# Patient Record
Sex: Female | Born: 1950 | Race: White | Hispanic: No | Marital: Single | Smoking: Never smoker
Health system: Southern US, Community
[De-identification: ages and names within clinical notes are randomized; demographics above are authoritative.]

## PROBLEM LIST (undated history)

## (undated) DIAGNOSIS — E781 Pure hyperglyceridemia: Secondary | ICD-10-CM

## (undated) DIAGNOSIS — S0285XA Fracture of orbit, unspecified, initial encounter for closed fracture: Secondary | ICD-10-CM

## (undated) DIAGNOSIS — S6291XA Unspecified fracture of right wrist and hand, initial encounter for closed fracture: Secondary | ICD-10-CM

## (undated) DIAGNOSIS — M1990 Unspecified osteoarthritis, unspecified site: Secondary | ICD-10-CM

## (undated) DIAGNOSIS — F32A Depression, unspecified: Secondary | ICD-10-CM

## (undated) DIAGNOSIS — F329 Major depressive disorder, single episode, unspecified: Secondary | ICD-10-CM

## (undated) DIAGNOSIS — F330 Major depressive disorder, recurrent, mild: Secondary | ICD-10-CM

## (undated) DIAGNOSIS — J45909 Unspecified asthma, uncomplicated: Secondary | ICD-10-CM

## (undated) HISTORY — DX: Pure hyperglyceridemia: E78.1

## (undated) HISTORY — DX: Depression, unspecified: F32.A

## (undated) HISTORY — DX: Major depressive disorder, recurrent, mild: F33.0

## (undated) HISTORY — DX: Major depressive disorder, single episode, unspecified: F32.9

## (undated) HISTORY — DX: Unspecified asthma, uncomplicated: J45.909

## (undated) HISTORY — DX: Unspecified fracture of right wrist and hand, initial encounter for closed fracture: S62.91XA

## (undated) HISTORY — PX: SIGMOIDOSCOPY: SUR1295

## (undated) HISTORY — DX: Fracture of orbit, unspecified, initial encounter for closed fracture: S02.85XA

---

## 1996-05-26 HISTORY — PX: BREAST BIOPSY: SHX20

## 2013-05-27 LAB — HM PAP SMEAR: HM PAP: NORMAL

## 2013-08-24 LAB — HM MAMMOGRAPHY: HM Mammogram: NORMAL

## 2014-05-28 ENCOUNTER — Ambulatory Visit: Payer: Self-pay | Admitting: Physician Assistant

## 2014-11-06 ENCOUNTER — Other Ambulatory Visit: Payer: Self-pay | Admitting: Internal Medicine

## 2014-11-06 DIAGNOSIS — Z1231 Encounter for screening mammogram for malignant neoplasm of breast: Secondary | ICD-10-CM

## 2014-11-08 ENCOUNTER — Ambulatory Visit
Admission: RE | Admit: 2014-11-08 | Discharge: 2014-11-08 | Disposition: A | Discharge status: Home / Self Care | Payer: 59 | Source: Ambulatory Visit | Attending: Internal Medicine | Admitting: Internal Medicine

## 2014-11-08 DIAGNOSIS — Z1231 Encounter for screening mammogram for malignant neoplasm of breast: Secondary | ICD-10-CM | POA: Insufficient documentation

## 2015-01-14 ENCOUNTER — Other Ambulatory Visit: Payer: Self-pay | Admitting: Internal Medicine

## 2015-01-14 ENCOUNTER — Encounter: Payer: Self-pay | Admitting: Internal Medicine

## 2015-01-14 DIAGNOSIS — F330 Major depressive disorder, recurrent, mild: Secondary | ICD-10-CM

## 2015-01-14 DIAGNOSIS — E781 Pure hyperglyceridemia: Secondary | ICD-10-CM | POA: Insufficient documentation

## 2015-01-14 HISTORY — DX: Major depressive disorder, recurrent, mild: F33.0

## 2015-02-02 ENCOUNTER — Encounter: Payer: Self-pay | Admitting: Internal Medicine

## 2015-02-02 ENCOUNTER — Ambulatory Visit (INDEPENDENT_AMBULATORY_CARE_PROVIDER_SITE_OTHER): Payer: 59 | Admitting: Internal Medicine

## 2015-02-02 VITALS — BP 110/70 | HR 72 | Ht 64.0 in | Wt 145.0 lb

## 2015-02-02 DIAGNOSIS — E781 Pure hyperglyceridemia: Secondary | ICD-10-CM | POA: Diagnosis not present

## 2015-02-02 DIAGNOSIS — Z1211 Encounter for screening for malignant neoplasm of colon: Secondary | ICD-10-CM | POA: Diagnosis not present

## 2015-02-02 DIAGNOSIS — Z0000 Encounter for general adult medical examination without abnormal findings: Secondary | ICD-10-CM

## 2015-02-02 DIAGNOSIS — F330 Major depressive disorder, recurrent, mild: Secondary | ICD-10-CM | POA: Diagnosis not present

## 2015-02-02 DIAGNOSIS — Z79899 Other long term (current) drug therapy: Secondary | ICD-10-CM | POA: Diagnosis not present

## 2015-02-02 MED ORDER — VENLAFAXINE HCL ER 150 MG PO CP24: 150.0000 mg | ORAL_CAPSULE | Freq: Every day | ORAL | Status: DC

## 2015-02-02 NOTE — Patient Instructions (Signed)

## 2015-02-02 NOTE — Progress Notes (Signed)
Date:  02/02/2015   Name:  Samantha Barton   DOB:  08-22-1950   MRN:  [id number]   Chief Complaint: Annual Exam and Depression Samantha Barton is a 64 y.o. female who presents today for her Complete Annual Exam. She feels fairly well. She reports exercising daily. She reports she is sleeping fairly well. Mammogram was done earlier this year. Pap smear was done within the past 2 years. She denies any breast complaints. She denies any vaginal discharge or abnormal bleeding. She's never had a colonoscopy but also denies any bowel problems. She is mostly limited by the fact that she has no one to take her to and from the colonoscopy. Depression        This is a chronic problem.  The current episode started more than 1 year ago.   The onset quality is gradual.   The problem occurs intermittently.  The problem has been waxing and waning since onset.  Associated symptoms include no decreased concentration, no fatigue and no headaches.     The symptoms are aggravated by nothing.  Past treatments include SNRIs - Serotonin and norepinephrine reuptake inhibitors.  Compliance with treatment is good.  she continues to struggle with depression. She has never had any hospitalizations or suicide attempts. She feels like her main issue has to do with social isolation. She volunteers and does other activities but never seems to connect with friends.   Review of Systems:  Review of Systems  Constitutional: Positive for unexpected weight change. Negative for fever, chills and fatigue.  HENT: Positive for hearing loss. Negative for sinus pressure, sore throat and trouble swallowing.   Eyes: Negative for visual disturbance.  Respiratory: Negative for choking, shortness of breath and wheezing.   Cardiovascular: Negative for chest pain, palpitations and leg swelling.  Gastrointestinal: Negative for abdominal pain, diarrhea, constipation and blood in stool.  Genitourinary: Negative for dysuria, urgency, frequency,  difficulty urinating and pelvic pain.  Musculoskeletal: Negative for arthralgias, gait problem and neck pain.  Allergic/Immunologic: Negative for environmental allergies and food allergies.  Neurological: Negative for tremors, syncope and headaches.  Psychiatric/Behavioral: Positive for depression and dysphoric mood. Negative for confusion, sleep disturbance, decreased concentration and agitation. The patient is not nervous/anxious and is not hyperactive.     Patient Active Problem List   Diagnosis Date Noted  . Hypertriglyceridemia 01/14/2015  . 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 Authorizing Provider  tretinoin (RETIN-A) 0.05 % cream RETIN-A, 0.05% (External Cream) - Historical Medication  (0.05 %) Active   Yes Historical Provider, MD  venlafaxine XR (EFFEXOR XR) 150 MG 24 hr capsule Take 1 capsule by mouth daily. 09/11/14  Yes Historical Provider, MD    No Known Allergies  Past Surgical History  Procedure Laterality Date  . Breast biopsy Right 1998    bx/clip-neg    Social History  Substance Use Topics  . Smoking status: Never Smoker   . Smokeless tobacco: None  . Alcohol Use: No     Medication list has been reviewed and updated.  Physical Examination:  Physical Exam  Constitutional: She is oriented to person, place, and time. She appears well-developed and well-nourished. No distress.  HENT:  Head: Normocephalic and atraumatic.  Eyes: Conjunctivae and EOM are normal. Pupils are equal, round, and reactive to light. Right eye exhibits no discharge. Left eye exhibits no discharge. No scleral icterus.  Neck: Normal range of motion. Neck  supple. No thyromegaly present.  Cardiovascular: Normal rate, regular rhythm and normal heart sounds.   Pulmonary/Chest: Effort normal and breath sounds normal. No respiratory distress. Right breast exhibits no mass, no nipple discharge, no skin  change and no tenderness. Left breast exhibits no mass, no nipple discharge, no skin change and no tenderness.  Abdominal: Soft. There is no tenderness. There is no rebound and no guarding.  Musculoskeletal: Normal range of motion. She exhibits no edema or tenderness.  Lymphadenopathy:    She has no cervical adenopathy.  Neurological: She is alert and oriented to person, place, and time. She has normal reflexes.  Skin: Skin is warm and dry. No rash noted.  Psychiatric: She has a normal mood and affect. Her behavior is normal. Thought content normal.  Nursing note and vitals reviewed.   BP 110/70 mmHg  Pulse 72  Ht 5\' 4"  (1.626 m)  Wt 145 lb (65.772 kg)  BMI 24.88 kg/m2  Assessment and Plan: 1. Annual physical exam Continue regular exercise, continue breast self exams, continue annual mammograms Consider Pap smear next year - POCT urinalysis dipstick  2. Depression, major, recurrent, mild Doing fairly well on current medication Patient is aware of resources should she need more assistance - venlafaxine XR (EFFEXOR XR) 150 MG 24 hr capsule; Take 1 capsule (150 mg total) by mouth daily.  Dispense: 90 capsule; Refill: 3 - TSH  3. Hypertriglyceridemia Continue low-fat diet - Lipid panel  4. Long term use of drug - CBC with Differential/Platelet - Comprehensive metabolic panel  5. Colon cancer screening We'll send fecal occult testing Unable to do colonoscopy due to lack of transportation - Fecal occult blood, imunochemical   Halina Maidens, MD [facility]  02/02/2015

## 2015-02-06 LAB — COMPREHENSIVE METABOLIC PANEL
A/G RATIO: 1.5 (ref 1.1–2.5)
ALK PHOS: 66 IU/L (ref 39–117)
ALT: 17 IU/L (ref 0–32)
AST: 21 IU/L (ref 0–40)
Albumin: 4.3 g/dL (ref 3.6–4.8)
BUN/Creatinine Ratio: 21 (ref 11–26)
BUN: 18 mg/dL (ref 8–27)
Bilirubin Total: 0.4 mg/dL (ref 0.0–1.2)
CALCIUM: 9.9 mg/dL (ref 8.7–10.3)
CO2: 28 mmol/L (ref 18–29)
Chloride: 100 mmol/L (ref 97–108)
Creatinine, Ser: 0.84 mg/dL (ref 0.57–1.00)
GFR calc Af Amer: 86 mL/min/{1.73_m2} (ref >59)
GFR calc non Af Amer: 74 mL/min/{1.73_m2} (ref >59)
Globulin, Total: 2.8 g/dL (ref 1.5–4.5)
Glucose: 93 mg/dL (ref 65–99)
POTASSIUM: 4.4 mmol/L (ref 3.5–5.2)
Sodium: 141 mmol/L (ref 134–144)
Total Protein: 7.1 g/dL (ref 6.0–8.5)

## 2015-02-06 LAB — CBC WITH DIFFERENTIAL/PLATELET
Basophils Absolute: 0 10*3/uL (ref 0.0–0.2)
Basos: 0 %
EOS (ABSOLUTE): 0.1 10*3/uL (ref 0.0–0.4)
Eos: 2 %
Hematocrit: 40.1 % (ref 34.0–46.6)
Hemoglobin: 13.6 g/dL (ref 11.1–15.9)
IMMATURE GRANULOCYTES: 0 %
Immature Grans (Abs): 0 10*3/uL (ref 0.0–0.1)
Lymphocytes Absolute: 1.7 10*3/uL (ref 0.7–3.1)
Lymphs: 36 %
MCH: 33.2 pg — ABNORMAL HIGH (ref 26.6–33.0)
MCHC: 33.9 g/dL (ref 31.5–35.7)
MCV: 98 fL — ABNORMAL HIGH (ref 79–97)
MONOS ABS: 0.4 10*3/uL (ref 0.1–0.9)
Monocytes: 8 %
NEUTROS PCT: 54 %
Neutrophils Absolute: 2.6 10*3/uL (ref 1.4–7.0)
PLATELETS: 295 10*3/uL (ref 150–379)
RBC: 4.1 x10E6/uL (ref 3.77–5.28)
RDW: 13.5 % (ref 12.3–15.4)
WBC: 4.8 10*3/uL (ref 3.4–10.8)

## 2015-02-06 LAB — LIPID PANEL
CHOL/HDL RATIO: 4.1 ratio (ref 0.0–4.4)
CHOLESTEROL TOTAL: 268 mg/dL — AB (ref 100–199)
HDL: 65 mg/dL (ref >39)
LDL Calculated: 172 mg/dL — ABNORMAL HIGH (ref 0–99)
TRIGLYCERIDES: 156 mg/dL — AB (ref 0–149)
VLDL Cholesterol Cal: 31 mg/dL (ref 5–40)

## 2015-02-06 LAB — TSH: TSH: 1.15 u[IU]/mL (ref 0.450–4.500)

## 2015-02-23 LAB — PLEASE NOTE

## 2015-02-23 LAB — FECAL OCCULT BLOOD, IMMUNOCHEMICAL: FECAL OCCULT BLD: NEGATIVE

## 2015-04-07 ENCOUNTER — Ambulatory Visit
Admission: AD | Admit: 2015-04-07 | Discharge: 2015-04-07 | Disposition: A | Discharge status: Home / Self Care | Payer: 59 | Source: Ambulatory Visit | Attending: Emergency Medicine | Admitting: Emergency Medicine

## 2015-04-07 ENCOUNTER — Ambulatory Visit: Payer: 59

## 2015-04-07 ENCOUNTER — Emergency Department
Admission: EM | Admit: 2015-04-07 | Discharge: 2015-04-07 | Disposition: A | Discharge status: Home / Self Care | Payer: 59 | Attending: Emergency Medicine | Admitting: Emergency Medicine

## 2015-04-07 ENCOUNTER — Ambulatory Visit: Admission: RE | Admit: 2015-04-07 | Payer: 59 | Source: Home / Self Care | Admitting: Emergency Medicine

## 2015-04-07 DIAGNOSIS — S6291XA Unspecified fracture of right wrist and hand, initial encounter for closed fracture: Secondary | ICD-10-CM

## 2015-04-07 DIAGNOSIS — Z79899 Other long term (current) drug therapy: Secondary | ICD-10-CM | POA: Insufficient documentation

## 2015-04-07 DIAGNOSIS — S62306A Unspecified fracture of fifth metacarpal bone, right hand, initial encounter for closed fracture: Secondary | ICD-10-CM | POA: Diagnosis not present

## 2015-04-07 DIAGNOSIS — S6991XA Unspecified injury of right wrist, hand and finger(s), initial encounter: Secondary | ICD-10-CM | POA: Diagnosis present

## 2015-04-07 DIAGNOSIS — S62304A Unspecified fracture of fourth metacarpal bone, right hand, initial encounter for closed fracture: Secondary | ICD-10-CM | POA: Diagnosis not present

## 2015-04-07 DIAGNOSIS — Y9289 Other specified places as the place of occurrence of the external cause: Secondary | ICD-10-CM | POA: Insufficient documentation

## 2015-04-07 DIAGNOSIS — M7989 Other specified soft tissue disorders: Secondary | ICD-10-CM | POA: Diagnosis present

## 2015-04-07 DIAGNOSIS — Y998 Other external cause status: Secondary | ICD-10-CM | POA: Diagnosis not present

## 2015-04-07 DIAGNOSIS — Y9352 Activity, horseback riding: Secondary | ICD-10-CM | POA: Insufficient documentation

## 2015-04-07 MED ORDER — TRAMADOL HCL 50 MG PO TABS: 50.0000 mg | ORAL_TABLET | Freq: Four times a day (QID) | ORAL | Status: DC | PRN

## 2015-04-07 MED ORDER — IBUPROFEN 800 MG PO TABS: 800.0000 mg | ORAL_TABLET | Freq: Three times a day (TID) | ORAL | Status: DC | PRN

## 2015-04-07 NOTE — ED Provider Notes (Signed)
[facility] Emergency Department Provider Note  ____________________________________________  Time seen: Approximately 1:02 PM  I have reviewed the triage vital signs and the nursing notes.   HISTORY  Chief Complaint Hand Pain    HPI Samantha Barton is a 64 y.o. female patient complaining of right hand pain and edema secondary to a fall from a horse. Instead occurred yesterday. Patient is rating her pain discomfort as a 6/10. No palliative measures taken this complaint. Patient is right-hand dominant. Denies any loss sensation or loss of strength.   Past Medical History  Diagnosis Date  . Depression     Patient Active Problem List   Diagnosis Date Noted  . Hypertriglyceridemia 01/14/2015  . Depression, major, recurrent, mild ([place]) 01/14/2015    Past Surgical History  Procedure Laterality Date  . Breast biopsy Right 1998    bx/clip-neg    Current Outpatient Rx  Name  Route  Sig  Dispense  Refill  . ibuprofen (ADVIL,MOTRIN) 800 MG tablet   Oral   Take 1 tablet (800 mg total) by mouth every 8 (eight) hours as needed for moderate pain.   15 tablet   0   . traMADol (ULTRAM) 50 MG tablet   Oral   Take 1 tablet (50 mg total) by mouth every 6 (six) hours as needed for moderate pain.   12 tablet   0   . tretinoin (RETIN-A) 0.05 % cream      RETIN-A, 0.05% (External Cream) - Historical Medication  (0.05 %) Active         . venlafaxine XR (EFFEXOR XR) 150 MG 24 hr capsule   Oral   Take 1 capsule (150 mg total) by mouth daily.   90 capsule   3     Allergies Review of patient's allergies indicates no known allergies.  Family History  Problem Relation Age of Onset  . Diabetes Father     Social History Social History  Substance Use Topics  . Smoking status: Never Smoker   . Smokeless tobacco: None  . Alcohol Use: No    Review of Systems Constitutional: No fever/chills Eyes: No visual changes. ENT: No sore  throat. Cardiovascular: Denies chest pain. Respiratory: Denies shortness of breath. Gastrointestinal: No abdominal pain.  No nausea, no vomiting.  No diarrhea.  No constipation. Genitourinary: Negative for dysuria. Musculoskeletal: Negative for back pain. Skin: Negative for rash. Neurological: Negative for headaches, focal weakness or numbness.  10-point ROS otherwise negative.  ____________________________________________   PHYSICAL EXAM:  VITAL SIGNS: ED Triage Vitals  Enc Vitals Group     BP 04/07/15 1254 126/76 mmHg     Pulse Rate 04/07/15 1254 75     Resp 04/07/15 1254 17     Temp 04/07/15 1254 97.7 F (36.5 C)     Temp Source 04/07/15 1254 Oral     SpO2 04/07/15 1254 98 %     Weight 04/07/15 1254 140 lb (63.504 kg)     Height 04/07/15 1254 5\' 4"  (1.626 m)     Head Cir --      Peak Flow --      Pain Score 04/07/15 1255 6     Pain Loc --      Pain Edu? --      Excl. in [place]? --     Constitutional: Alert and oriented. Well appearing and in no acute distress. Eyes: Conjunctivae are normal. PERRL. EOMI. Head: Atraumatic. Nose: No congestion/rhinnorhea. Mouth/Throat: Mucous membranes are moist.  Oropharynx non-erythematous.  Neck: No stridor.  No cervical spine tenderness to palpation. Hematological/Lymphatic/Immunilogical: No cervical lymphadenopathy. Cardiovascular: Normal rate, regular rhythm. Grossly normal heart sounds.  Good peripheral circulation. Respiratory: Normal respiratory effort.  No retractions. Lungs CTAB. Gastrointestinal: Soft and nontender. No distention. No abdominal bruits. No CVA tenderness. Musculoskeletal: Obvious edema to the right hand.  Neurologic:  Normal speech and language. No gross focal neurologic deficits are appreciated. No gait instability. Skin:  Skin is warm, dry and intact. No rash noted. Psychiatric: Mood and affect are normal. Speech and behavior are normal.  ____________________________________________   LABS (all labs  ordered are listed, but only abnormal results are displayed)  Labs Reviewed - No data to display ____________________________________________  EKG   ____________________________________________  RADIOLOGY  Fractured right fifth and fourth metacarpal PROCEDURES  Procedure(s) performed: None  Critical Care performed: No  ____________________________________________   INITIAL IMPRESSION / ASSESSMENT AND PLAN / ED COURSE  Pertinent labs & imaging results that were available during my care of the patient were reviewed by me and considered in my medical decision making (see chart for details).  Fracture right fourth and fifth metacarpal. Patient placed an ulnar gutter splint. Patient advised to follow orthopedics by calling for pulmonary Monday morning. She given prescription for ibuprofen and tramadol. ____________________________________________   FINAL CLINICAL IMPRESSION(S) / ED DIAGNOSES  Final diagnoses:  Right hand fracture, closed, initial encounter      Sable Feil, PA-C 04/07/15 [place], MD 04/07/15 [phone]

## 2015-04-07 NOTE — ED Notes (Signed)
Pt states she was thrown from a horse yesterday and is having swelling to the right hand and face.Marland Kitchen

## 2015-04-07 NOTE — ED Notes (Signed)
Pt here to have x-rays order read, PA unable to access the read, results printed by this RN with pt's permission and per request of Randel Pigg PA, witnessed by [employer] RN

## 2015-04-07 NOTE — Discharge Instructions (Signed)
Wear his splint until follow-up with orthopedic Dr.

## 2015-04-10 ENCOUNTER — Other Ambulatory Visit: Payer: Self-pay | Admitting: Ophthalmology

## 2015-04-10 ENCOUNTER — Ambulatory Visit
Admission: RE | Admit: 2015-04-10 | Discharge: 2015-04-10 | Disposition: A | Discharge status: Home / Self Care | Payer: 59 | Source: Ambulatory Visit | Attending: Ophthalmology | Admitting: Ophthalmology

## 2015-04-10 DIAGNOSIS — S0083XA Contusion of other part of head, initial encounter: Secondary | ICD-10-CM | POA: Diagnosis not present

## 2015-04-10 DIAGNOSIS — H0589 Other disorders of orbit: Secondary | ICD-10-CM | POA: Insufficient documentation

## 2015-04-10 DIAGNOSIS — T148XXA Other injury of unspecified body region, initial encounter: Secondary | ICD-10-CM

## 2015-04-10 DIAGNOSIS — S0230XA Fracture of orbital floor, unspecified side, initial encounter for closed fracture: Secondary | ICD-10-CM | POA: Diagnosis not present

## 2015-04-23 ENCOUNTER — Other Ambulatory Visit: Payer: 59

## 2015-04-24 ENCOUNTER — Encounter: Payer: Self-pay | Admitting: *Deleted

## 2015-04-24 ENCOUNTER — Other Ambulatory Visit: Payer: 59

## 2015-04-24 NOTE — Patient Instructions (Signed)
  Your procedure is scheduled on: 04-25-15 Report to [facility] To find out your arrival time please call [phone] between 1PM - 3PM on 04-24-15  Remember: Instructions that are not followed completely may result in serious medical risk, up to and including death, or upon the discretion of your surgeon and anesthesiologist your surgery may need to be rescheduled.    _X___ 1. Do not eat food or drink liquids after midnight. No gum chewing or hard candies.     _X___ 2. No Alcohol for 24 hours before or after surgery.   ____ 3. Bring all medications with you on the day of surgery if instructed.    ____ 4. Notify your doctor if there is any change in your medical condition     (cold, fever, infections).     Do not wear jewelry, make-up, hairpins, clips or nail polish.  Do not wear lotions, powders, or perfumes. You may wear deodorant.  Do not shave 48 hours prior to surgery. Men may shave face and neck.  Do not bring valuables to the hospital.    [facility] is not responsible for any belongings or valuables.               Contacts, dentures or bridgework may not be worn into surgery.  Leave your suitcase in the car. After surgery it may be brought to your room.  For patients admitted to the hospital, discharge time is determined by your  treatment team.   Patients discharged the day of surgery will not be allowed to drive home.   Please read over the following fact sheets that you were given:     _X___ Take these medicines the morning of surgery with A SIP OF WATER:    1. EFFEXOR  2.   3.   4.  5.  6.  ____ Fleet Enema (as directed)   ____ Use CHG Soap as directed  ____ Use inhalers on the day of surgery  ____ Stop metformin 2 days prior to surgery    ____ Take 1/2 of usual insulin dose the night before surgery and none on the morning of surgery.   ____ Stop Coumadin/Plavix/aspirin-N/A  ____ Stop Anti-inflammatories-NO NSAIDS OR ASA  PRODUCTS-TYLENOL OK TO TAKE   ____ Stop supplements until after surgery.    ____ Bring C-Pap to the hospital.

## 2015-04-25 ENCOUNTER — Ambulatory Visit: Payer: 59 | Admitting: *Deleted

## 2015-04-25 ENCOUNTER — Ambulatory Visit
Admission: RE | Admit: 2015-04-25 | Discharge: 2015-04-25 | Disposition: A | Discharge status: Home / Self Care | Payer: 59 | Source: Ambulatory Visit | Attending: Otolaryngology | Admitting: Otolaryngology

## 2015-04-25 ENCOUNTER — Encounter: Payer: Self-pay | Admitting: *Deleted

## 2015-04-25 ENCOUNTER — Encounter
Admission: RE | Disposition: A | Discharge status: Home / Self Care | Payer: Self-pay | Source: Ambulatory Visit | Attending: Otolaryngology

## 2015-04-25 DIAGNOSIS — F329 Major depressive disorder, single episode, unspecified: Secondary | ICD-10-CM | POA: Diagnosis not present

## 2015-04-25 DIAGNOSIS — Z79899 Other long term (current) drug therapy: Secondary | ICD-10-CM | POA: Insufficient documentation

## 2015-04-25 DIAGNOSIS — W19XXXA Unspecified fall, initial encounter: Secondary | ICD-10-CM | POA: Diagnosis not present

## 2015-04-25 DIAGNOSIS — S0231XA Fracture of orbital floor, right side, initial encounter for closed fracture: Secondary | ICD-10-CM | POA: Insufficient documentation

## 2015-04-25 DIAGNOSIS — S0230XA Fracture of orbital floor, unspecified side, initial encounter for closed fracture: Secondary | ICD-10-CM

## 2015-04-25 DIAGNOSIS — M1990 Unspecified osteoarthritis, unspecified site: Secondary | ICD-10-CM | POA: Diagnosis not present

## 2015-04-25 HISTORY — PX: ORIF ORBITAL FRACTURE: SHX5312

## 2015-04-25 HISTORY — DX: Unspecified osteoarthritis, unspecified site: M19.90

## 2015-04-25 HISTORY — PX: ORBITAL FRACTURE SURGERY: SHX725

## 2015-04-25 SURGERY — OPEN REDUCTION INTERNAL FIXATION (ORIF) ORBITAL FRACTURE
Anesthesia: General | Site: Face | Laterality: Right | Wound class: Clean

## 2015-04-25 MED ORDER — BACITRACIN ZINC 500 UNIT/GM EX OINT
TOPICAL_OINTMENT | CUTANEOUS | Status: AC
  Filled 2015-04-25: qty 28.35

## 2015-04-25 MED ORDER — FENTANYL CITRATE (PF) 100 MCG/2ML IJ SOLN
INTRAMUSCULAR | Status: DC | PRN
  Administered 2015-04-25 (×3): 25 ug via INTRAVENOUS
  Administered 2015-04-25: 100 ug via INTRAVENOUS
  Administered 2015-04-25: 50 ug via INTRAVENOUS

## 2015-04-25 MED ORDER — PROPOFOL 10 MG/ML IV BOLUS
INTRAVENOUS | Status: DC | PRN
  Administered 2015-04-25: 160 mg via INTRAVENOUS

## 2015-04-25 MED ORDER — HYDROMORPHONE HCL 1 MG/ML IJ SOLN
INTRAMUSCULAR | Status: AC
  Filled 2015-04-25: qty 1

## 2015-04-25 MED ORDER — LACTATED RINGERS IV SOLN
INTRAVENOUS | Status: DC
  Administered 2015-04-25 (×3): via INTRAVENOUS

## 2015-04-25 MED ORDER — PHENYLEPHRINE HCL 10 MG/ML IJ SOLN: INTRAMUSCULAR | Status: DC | PRN

## 2015-04-25 MED ORDER — NEOMYCIN-POLYMYXIN-DEXAMETH 3.5-10000-0.1 OP OINT
TOPICAL_OINTMENT | OPHTHALMIC | Status: DC | PRN
  Administered 2015-04-25: 1 via OPHTHALMIC

## 2015-04-25 MED ORDER — HYDROMORPHONE HCL 1 MG/ML IJ SOLN
0.2500 mg | INTRAMUSCULAR | Status: DC | PRN
  Administered 2015-04-25 (×2): 0.25 mg via INTRAVENOUS

## 2015-04-25 MED ORDER — DEXAMETHASONE SODIUM PHOSPHATE 4 MG/ML IJ SOLN
INTRAMUSCULAR | Status: DC | PRN
  Administered 2015-04-25: 8 mg via INTRAVENOUS

## 2015-04-25 MED ORDER — LIDOCAINE-EPINEPHRINE (PF) 1 %-1:200000 IJ SOLN
INTRAMUSCULAR | Status: DC | PRN
Start: 2015-04-25 — End: 2015-04-25
  Administered 2015-04-25: 1.5 mL

## 2015-04-25 MED ORDER — LIDOCAINE-EPINEPHRINE (PF) 1 %-1:200000 IJ SOLN
INTRAMUSCULAR | Status: AC
  Filled 2015-04-25: qty 30

## 2015-04-25 MED ORDER — GELATIN ADSORBABLE OP FILM
ORAL_FILM | OPHTHALMIC | Status: AC
  Filled 2015-04-25: qty 1

## 2015-04-25 MED ORDER — MIDAZOLAM HCL 2 MG/2ML IJ SOLN
INTRAMUSCULAR | Status: DC | PRN
  Administered 2015-04-25: 2 mg via INTRAVENOUS

## 2015-04-25 MED ORDER — FAMOTIDINE 20 MG PO TABS
ORAL_TABLET | ORAL | Status: AC
  Administered 2015-04-25: 20 mg via ORAL
  Filled 2015-04-25: qty 1

## 2015-04-25 MED ORDER — PHENYLEPHRINE HCL 10 MG/ML IJ SOLN
INTRAMUSCULAR | Status: DC | PRN
  Administered 2015-04-25 (×2): 50 ug via INTRAVENOUS
  Administered 2015-04-25: 5 ug via INTRAVENOUS

## 2015-04-25 MED ORDER — ROCURONIUM BROMIDE 100 MG/10ML IV SOLN
INTRAVENOUS | Status: DC | PRN
  Administered 2015-04-25 (×2): 10 mg via INTRAVENOUS
  Administered 2015-04-25: 40 mg via INTRAVENOUS
  Administered 2015-04-25: 10 mg via INTRAVENOUS

## 2015-04-25 MED ORDER — HYDROCODONE-ACETAMINOPHEN 5-325 MG PO TABS: 1.0000 | ORAL_TABLET | ORAL | Status: DC | PRN

## 2015-04-25 MED ORDER — SUGAMMADEX SODIUM 200 MG/2ML IV SOLN
INTRAVENOUS | Status: DC | PRN
  Administered 2015-04-25: 120 mg via INTRAVENOUS

## 2015-04-25 MED ORDER — ONDANSETRON HCL 4 MG/2ML IJ SOLN
INTRAMUSCULAR | Status: DC | PRN
  Administered 2015-04-25: 4 mg via INTRAVENOUS

## 2015-04-25 MED ORDER — LIDOCAINE HCL (CARDIAC) 20 MG/ML IV SOLN
INTRAVENOUS | Status: DC | PRN
  Administered 2015-04-25: 40 mg via INTRAVENOUS

## 2015-04-25 MED ORDER — ONDANSETRON HCL 4 MG/2ML IJ SOLN: 4.0000 mg | Freq: Once | INTRAMUSCULAR | Status: DC | PRN

## 2015-04-25 MED ORDER — FAMOTIDINE 20 MG PO TABS
20.0000 mg | ORAL_TABLET | Freq: Once | ORAL | Status: AC
Start: 2015-04-25 — End: 2015-04-25
  Administered 2015-04-25: 20 mg via ORAL

## 2015-04-25 MED ORDER — CEFPROZIL 500 MG PO TABS: 500.0000 mg | ORAL_TABLET | Freq: Two times a day (BID) | ORAL | Status: DC

## 2015-04-25 SURGICAL SUPPLY — 33 items
ADHESIVE MASTISOL STRL (MISCELLANEOUS) ×2 IMPLANT
APPLICATOR COTTON TIP 6IN STRL (MISCELLANEOUS) ×2 IMPLANT
BLADE SURG 15 STRL LF DISP TIS (BLADE) ×1 IMPLANT
BLADE SURG 15 STRL SS (BLADE) ×1
CANISTER SUCT 1200ML W/VALVE (MISCELLANEOUS) ×2 IMPLANT
CORD BIP STRL DISP 12FT (MISCELLANEOUS) ×2 IMPLANT
DRAPE MAG INST 16X20 L/F (DRAPES) ×2 IMPLANT
ELECT CAUTERY NEEDLE 2.0 MIC (NEEDLE) ×2 IMPLANT
FORCEPS JEWEL BIP 4-3/4 STR (INSTRUMENTS) ×2 IMPLANT
GAUZE SPONGE 4X4 12PLY STRL (GAUZE/BANDAGES/DRESSINGS) ×2 IMPLANT
GLOVE BIO SURGEON STRL SZ7.5 (GLOVE) ×2 IMPLANT
GOWN STRL REUS W/ TWL LRG LVL3 (GOWN DISPOSABLE) ×2 IMPLANT
GOWN STRL REUS W/TWL LRG LVL3 (GOWN DISPOSABLE) ×2
KIT RM TURNOVER STRD PROC AR (KITS) ×2 IMPLANT
LABEL OR SOLS (LABEL) ×2 IMPLANT
NS IRRIG 1000ML POUR BTL (IV SOLUTION) ×2 IMPLANT
Orbital Plate ×2 IMPLANT
PACK HEAD/NECK (MISCELLANEOUS) ×2 IMPLANT
PAD GROUND ADULT SPLIT (MISCELLANEOUS) ×2 IMPLANT
SCREW UPPERFACE 1.2X3M SL (Screw) ×4 IMPLANT
SOL BAL SALT 15ML (MISCELLANEOUS) ×2
SOLUTION BAL SALT 15ML (MISCELLANEOUS) ×1 IMPLANT
STRIP CLOSURE SKIN 1/2X4 (GAUZE/BANDAGES/DRESSINGS) ×2 IMPLANT
SUCTION FRAZIER TIP 10 FR DISP (SUCTIONS) ×2 IMPLANT
SUT CHROMIC 5-0 18 C-1 SG197 (SUTURE)
SUT PLAIN GUT (SUTURE) IMPLANT
SUT SILK 3-0 (SUTURE) ×2
SUT SILK 3-0 RB1 30XBRD (SUTURE) ×2
SUT VIC AB 4-0 RB1 27 (SUTURE) ×1
SUT VIC AB 4-0 RB1 27X BRD (SUTURE) ×1 IMPLANT
SUT VIC AB 5-0 RB1 27 (SUTURE) IMPLANT
SUTURE CHROMC 5-0 18 C-1 SG197 (SUTURE) IMPLANT
SUTURE SILK 3-0 RB1 30XBRD (SUTURE) ×2 IMPLANT

## 2015-04-25 NOTE — Transfer of Care (Signed)
Immediate Anesthesia Transfer of Care Note  Patient: Samantha Barton  Procedure(s) Performed: Procedure(s): OPEN REDUCTION INTERNAL FIXATION (ORIF) ORBITAL FRACTURE (Right)  Patient Location: PACU  Anesthesia Type:General  Level of Consciousness: sedated  Airway & Oxygen Therapy: Patient Spontanous Breathing and Patient connected to nasal cannula oxygen  Post-op Assessment: Report given to RN  Post vital signs: Reviewed and stable  Last Vitals:  Filed Vitals:   04/25/15 0934 04/25/15 1331  BP: 143/80 149/88  Pulse: 100 89  Temp: 37 C 36.7 C  Resp: 16 16    Complications: No apparent anesthesia complications

## 2015-04-25 NOTE — Discharge Instructions (Signed)
AMBULATORY SURGERY  DISCHARGE INSTRUCTIONS   1) The drugs that you were given will stay in your system until tomorrow so for the next 24 hours you should not:  A) Drive an automobile B) Make any legal decisions C) Drink any alcoholic beverage      Orbital Floor Fracture, Non-Blowout The eye sits in the part of the skull called the "orbit." The upper and outside walls of the orbit are thick and strong. The inside wall (near the nose) and the orbital floor are very thin and weak. The tissues around the eye will briefly press together if there is a direct blow to the front of the eye. This leads to high pressure against the orbital walls. The inside wall and the orbital floor may break since these are the weakest walls. If the orbital floor breaks, the tissues around the eye, including the muscle that makes the eye look down, may become trapped in the sinus below when the orbital floor "blows out." If a blowout does not happen, the orbital floor fracture is considered a non-blowout orbital fracture. CAUSES An orbital floor fracture can be caused by any accident in which an object hits the face or the face strikes against a hard object. The most common ways that people break their eye socket include: Being hit by a blunt object, such as a baseball bat or a fist. Striking the face on the car dashboard during a crash. Falls. Gunshot. SYMPTOMS  If there has been no injury to the eye itself, symptoms may include: Puffiness (swelling) and bruising around the eye area (black eye). Numbness of the cheek and upper gum on the side with the floor fracture. This is caused by nerve injury to these areas. Pain around the eye. Headache. Ear pain on the injured side. DIAGNOSIS The diagnosis of an orbital floor fracture is suspected during an eye exam by an ophthalmologist. It is confirmed by X-rays or CT scan. TREATMENT Your caregiver may suggest waiting 1 or 2 weeks for the swelling to go away before  examining the eye. When the swelling lessens, your caregiver will examine the eye to see if there is any sign of a trapped muscle or double vision when looking in different directions. If double vision is not found and muscle or tissue did not get trapped, no further treatment is necessary. After that, in almost all cases, the bones heal together on their own.  HOME CARE INSTRUCTIONS Take all pain medicine as directed by your caregiver. Use ice packs or other cold therapy to reduce swelling as directed by your caregiver. Do not put a contact lens in the injured eye until your caregiver approves. Avoid dusty environments. Always wear protective glasses or goggles when recommended. Wearing protective eyewear is not dangerous to your injured eye and will not delay healing. As long as your other eye is seeing normally, you may return to work and drive. You may travel by plane or be in high altitudes. However, your swelling may take longer to go away, and you may have sinus pain. Be aware that your depth perception and your ability to judge distance may be reduced or lost. SEEK IMMEDIATE MEDICAL CARE IF: Your vision changes. Your redness or swelling persists around the injured eye or gets worse. You start to have double vision. You have a bloody or discolored discharge from your nose. You have a fever that lasts longer than 2 to 3 days. You have a fever that suddenly gets worse. Your cheek or  upper gum numbness does not go away. MAKE SURE YOU: Understand these instructions. Will watch your condition. Will get help right away if you are not doing well or get worse.   This information is not intended to replace advice given to you by your health care provider. Make sure you discuss any questions you have with your health care provider.   Document Released: 08/04/2011 Document Revised: 06/02/2014 Document Reviewed: 08/04/2011 Elsevier Interactive Patient Education 2016 [employer]    APPLY  OPTHALMIC OINTMENT TO RIGHT EYE 3 TIMES A DAILY   SALINE WETTING DROPS AS NEEDED   ICE PACK TO RIGHT EYE TO HELP WITH SWELLING AS NEEDED

## 2015-04-25 NOTE — Anesthesia Preprocedure Evaluation (Signed)
Anesthesia Evaluation  Patient identified by MRN, date of birth, ID band Patient awake    Reviewed: Allergy & Precautions, NPO status , Patient's Chart, lab work & pertinent test results  Airway Mallampati: I  TM Distance: >3 FB Neck ROM: Full    Dental  (+) Teeth Intact   Pulmonary    Pulmonary exam normal        Cardiovascular Exercise Tolerance: Good negative cardio ROS Normal cardiovascular exam     Neuro/Psych Depression Treated.   GI/Hepatic negative GI ROS, Neg liver ROS,   Endo/Other  negative endocrine ROS  Renal/GU negative Renal ROS     Musculoskeletal  (+) Arthritis , Osteoarthritis,    Abdominal Normal abdominal exam  (+)   Peds  Hematology negative hematology ROS (+)   Anesthesia Other Findings   Reproductive/Obstetrics                             Anesthesia Physical Anesthesia Plan  ASA: II  Anesthesia Plan: General   Post-op Pain Management:    Induction: Intravenous  Airway Management Planned: Oral ETT  Additional Equipment:   Intra-op Plan:   Post-operative Plan: Extubation in OR  Informed Consent: I have reviewed the patients History and Physical, chart, labs and discussed the procedure including the risks, benefits and alternatives for the proposed anesthesia with the patient or authorized representative who has indicated his/her understanding and acceptance.     Plan Discussed with: CRNA  Anesthesia Plan Comments:         Anesthesia Quick Evaluation

## 2015-04-25 NOTE — Anesthesia Procedure Notes (Signed)
Procedure Name: Intubation Date/Time: 04/25/2015 11:05 AM Performed by: Allean Found Pre-anesthesia Checklist: Patient identified, Emergency Drugs available, Suction available, Patient being monitored and Timeout performed Patient Re-evaluated:Patient Re-evaluated prior to inductionOxygen Delivery Method: Circle system utilized Preoxygenation: Pre-oxygenation with 100% oxygen Intubation Type: IV induction Ventilation: Mask ventilation without difficulty Laryngoscope Size: Mac and 3 Grade View: Grade I Tube type: Oral Tube size: 7.0 mm Number of attempts: 1 Airway Equipment and Method: Stylet Placement Confirmation: ETT inserted through vocal cords under direct vision,  positive ETCO2 and breath sounds checked- equal and bilateral Secured at: 21 cm Tube secured with: Tape Dental Injury: Teeth and Oropharynx as per pre-operative assessment

## 2015-04-25 NOTE — H&P (Signed)
History and physical reviewed and will be scanned in later. No change in medical status reported by the patient or family, appears stable for surgery. All questions regarding the procedure answered, and patient (or family if a child) expressed understanding of the procedure.  Samantha Barton @TODAY@ 

## 2015-04-25 NOTE — Op Note (Signed)
04/25/2015  1:15 PM    Samantha Barton  [id number]   Pre-Op Diagnosis:  RIGHT ORBITAL FLOOR FRACTURE  Post-op Diagnosis: RIGHT ORBITAL FLOOR FRACTURE  Procedure:   Open reduction and internal fixation of right orbital floor fracture with trans-conjunctival approach  Surgeon:  Riley Nearing  Anesthesia:  General endotracheal  EBL:  Less than 10 cc  Complications:  None  Findings: Orbital floor trap door deformity with fragmentation of the bone requiring titanium mesh reconstruction  Procedure: After the patient was identified in holding and the procedure was reviewed.  The patient was taken to the operating room and with the patient in a comfortable supine position,  general endotracheal anesthesia was induced without difficulty.  A proper time-out was performed, confirming the operative site and procedure.  1% lidocaine with epinephrine 1 200,000 was injected into the conjunctiva as well as the lateral canthus region, and injected through the lid at the level of the orbital rim for hemostasis. The patient was then prepped and draped in the usual sterile fashion. BSS solution was used to moisten the eye.  A 15 blade was used to incise the conjunctiva just below the tarsal plate, starting just lateral to the punctum and extending the incision out laterally towards the lateral canthus. Tenotomies were then used to dissect through the orbital septum just below the tarsal plate, until the orbicularis and oculi muscle was encountered. The cut edge of the conjunctiva was sutured with 3-0 silk suture, which was then used to provide countertraction, pulling the conjunctiva of the lid over the cornea and protecting it. This was secured to the drapes with hemostats, providing excellent protection of the cornea which was completely covered. BSS solution was frequently placed in the eye below this conjunctival flap 3 lubricate the cornea during the surgery. Dissection then proceeded inferiorly between  the muscle and the orbital septum until the orbital rim was reached. The periosteum of the orbital rim was incised sharply with a 15 blade. A Freer elevator was then used to elevate the periorbita from the floor of the orbit, immediately encountering the trap door defect in the floor of the orbit, and fat was herniating into the maxillary sinus. This tissue was reduced back into the orbit completely, carefully dissecting until the entirety of the herniating fat was reduced, and the orbital floor  defect was completely exposed. The infraorbital nerve was noted to be dehiscent medially and was carefully protected during the procedure, as it was at the medial aspect of the fracture line. A hook was then used to reduce the fracture, however the bone was comminuted and portions of it missing, and I could not get the bone to reliably reduce as it wanted to herniate back into the maxillary sinus. It was felt that a Gelfilm reconstruction would not be adequate for this reason, so a titanium mesh plate was used instead. This was carefully trimmed to fit the defect with adequate overlap of surrounding intact bone, minimizing the size of the plate is much as possible. The plate was also recontoured to fit as closely to the orbital contour as possible. It was then placed into position and secured with two 3 mm screws at the orbital rim, after using a drill with copious irrigation to place drill holes for the screws. The wound was then irrigated and the conjunctival incision closed with 6-0 chromic gut suture in a running stitch with the knots buried. Forced duction testing was performed and the eye moved freely with no  evidence of entrapment. The eye was irrigated once again with BSS solution and ophthalmic antibiotic ointment then applied to the eye.  The patient was then returned to the anesthesiologist in good condition for awakening. The patient was awakened and taken to the recovery room in good  condition.   Disposition:   PACU then discharge home  Plan: Cefadroxil 500 mg by mouth twice a day for 10 days, apply ophthalmic ointment to the eye 3 times daily, and saline rewetting drops as needed. Ice to the eye to help with swelling as needed. Follow-up in 1 week  Riley Nearing 04/25/2015 1:15 PM

## 2015-04-25 NOTE — Anesthesia Postprocedure Evaluation (Signed)
Anesthesia Post Note  Patient: Samantha Barton  Procedure(s) Performed: Procedure(s) (LRB): OPEN REDUCTION INTERNAL FIXATION (ORIF) ORBITAL FRACTURE (Right)  Patient location during evaluation: PACU Anesthesia Type: General Level of consciousness: awake and alert Pain management: pain level controlled Vital Signs Assessment: post-procedure vital signs reviewed and stable Respiratory status: spontaneous breathing Cardiovascular status: blood pressure returned to baseline Postop Assessment: no headache Anesthetic complications: no    Last Vitals:  Filed Vitals:   04/25/15 1430 04/25/15 1447  BP: 149/86 135/82  Pulse: 82 83  Temp:  35.6 C  Resp: 14 14    Last Pain:  Filed Vitals:   04/25/15 1449  PainSc: 5                  Edel Rivero, Marene M

## 2015-05-27 HISTORY — PX: FRACTURE SURGERY: SHX138

## 2015-06-08 DIAGNOSIS — S62319D Displaced fracture of base of unspecified metacarpal bone, subsequent encounter for fracture with routine healing: Secondary | ICD-10-CM | POA: Diagnosis not present

## 2015-06-08 DIAGNOSIS — H43811 Vitreous degeneration, right eye: Secondary | ICD-10-CM | POA: Diagnosis not present

## 2015-06-20 DIAGNOSIS — H524 Presbyopia: Secondary | ICD-10-CM | POA: Diagnosis not present

## 2015-06-27 DIAGNOSIS — S0285XA Fracture of orbit, unspecified, initial encounter for closed fracture: Secondary | ICD-10-CM

## 2015-06-27 DIAGNOSIS — S6291XA Unspecified fracture of right wrist and hand, initial encounter for closed fracture: Secondary | ICD-10-CM

## 2015-06-27 HISTORY — DX: Unspecified fracture of right wrist and hand, initial encounter for closed fracture: S62.91XA

## 2015-06-27 HISTORY — DX: Fracture of orbit, unspecified, initial encounter for closed fracture: S02.85XA

## 2015-10-16 ENCOUNTER — Other Ambulatory Visit: Payer: Self-pay | Admitting: Internal Medicine

## 2015-10-16 DIAGNOSIS — Z1231 Encounter for screening mammogram for malignant neoplasm of breast: Secondary | ICD-10-CM

## 2015-11-13 ENCOUNTER — Ambulatory Visit: Payer: 59

## 2015-12-04 ENCOUNTER — Ambulatory Visit
Admission: RE | Admit: 2015-12-04 | Discharge: 2015-12-04 | Disposition: A | Discharge status: Home / Self Care | Payer: 59 | Source: Ambulatory Visit | Attending: Internal Medicine | Admitting: Internal Medicine

## 2015-12-04 DIAGNOSIS — Z1231 Encounter for screening mammogram for malignant neoplasm of breast: Secondary | ICD-10-CM | POA: Diagnosis not present

## 2015-12-28 DIAGNOSIS — D224 Melanocytic nevi of scalp and neck: Secondary | ICD-10-CM | POA: Diagnosis not present

## 2015-12-28 DIAGNOSIS — L570 Actinic keratosis: Secondary | ICD-10-CM | POA: Diagnosis not present

## 2015-12-28 DIAGNOSIS — D485 Neoplasm of uncertain behavior of skin: Secondary | ICD-10-CM | POA: Diagnosis not present

## 2015-12-28 DIAGNOSIS — L821 Other seborrheic keratosis: Secondary | ICD-10-CM | POA: Diagnosis not present

## 2015-12-28 DIAGNOSIS — Z1283 Encounter for screening for malignant neoplasm of skin: Secondary | ICD-10-CM | POA: Diagnosis not present

## 2016-01-06 ENCOUNTER — Other Ambulatory Visit: Payer: Self-pay | Admitting: Internal Medicine

## 2016-01-07 DIAGNOSIS — D485 Neoplasm of uncertain behavior of skin: Secondary | ICD-10-CM | POA: Diagnosis not present

## 2016-01-07 DIAGNOSIS — D224 Melanocytic nevi of scalp and neck: Secondary | ICD-10-CM | POA: Diagnosis not present

## 2016-01-08 ENCOUNTER — Ambulatory Visit (INDEPENDENT_AMBULATORY_CARE_PROVIDER_SITE_OTHER): Payer: 59 | Admitting: Internal Medicine

## 2016-01-08 ENCOUNTER — Encounter: Payer: Self-pay | Admitting: Internal Medicine

## 2016-01-08 VITALS — BP 117/78 | HR 86 | Resp 16 | Ht 64.0 in | Wt 143.2 lb

## 2016-01-08 DIAGNOSIS — F330 Major depressive disorder, recurrent, mild: Secondary | ICD-10-CM

## 2016-01-08 MED ORDER — VENLAFAXINE HCL ER 75 MG PO CP24: 75.0000 mg | ORAL_CAPSULE | Freq: Every day | ORAL | 0 refills | Status: DC

## 2016-01-08 NOTE — Progress Notes (Signed)
    Date:  01/08/2016   Name:  Samantha Barton   DOB:  [birth year]   MRN:  [id number]   Chief Complaint: Depression (Wants Effexor increased ) Depression         This is a chronic problem.  The current episode started more than 1 year ago.   The problem has been gradually worsening since onset.  Associated symptoms include helplessness, hopelessness, irritable, restlessness and decreased interest.  Past treatments include SNRIs - Serotonin and norepinephrine reuptake inhibitors.     Review of Systems  Constitutional: Negative for diaphoresis and unexpected weight change.  Eyes: Negative for visual disturbance.  Respiratory: Negative for cough, chest tightness, shortness of breath and wheezing.   Musculoskeletal: Negative for arthralgias, gait problem and joint swelling.  Psychiatric/Behavioral: Positive for depression. Negative for dysphoric mood. The patient is not nervous/anxious and is not hyperactive.     Patient Active Problem List   Diagnosis Date Noted  . Hypertriglyceridemia 01/14/2015  . Depression, major, recurrent, mild ([place]) 01/14/2015    Prior to Admission medications   Medication Sig Start Date End Date Taking? Authorizing Provider  acetaminophen (TYLENOL) 325 MG tablet Take 650 mg by mouth every 6 (six) hours as needed.   Yes Historical Provider, MD  B Complex-C-Biotin-D-Zinc-FA (VITAL-D RX PO) Take by mouth.   Yes Historical Provider, MD  tretinoin (RETIN-A) 0.05 % cream RETIN-A, 0.05% (External Cream) - Historical Medication  (0.05 %) Active   Yes Historical Provider, MD  venlafaxine XR (EFFEXOR XR) 150 MG 24 hr capsule Take 1 capsule (150 mg total) by mouth daily. Patient taking differently: Take 150 mg by mouth every morning.  02/02/15  Yes Glean Hess, MD    No Known Allergies  Past Surgical History:  Procedure Laterality Date  . BREAST BIOPSY Right 1998   bx/clip-neg  . ORBITAL FRACTURE SURGERY  04/25/2015  . ORIF ORBITAL FRACTURE Right 04/25/2015    Procedure: OPEN REDUCTION INTERNAL FIXATION (ORIF) ORBITAL FRACTURE;  Surgeon: Clyde Canterbury, MD;  Location: [facility] ORS;  Service: ENT;  Laterality: Right;  . SIGMOIDOSCOPY      Social History  Substance Use Topics  . Smoking status: Never Smoker  . Smokeless tobacco: Never Used  . Alcohol use No     Medication list has been reviewed and updated.   Physical Exam  Constitutional: She is oriented to person, place, and time. She appears well-developed. She is irritable. No distress.  HENT:  Head: Normocephalic and atraumatic.  Pulmonary/Chest: Effort normal. No respiratory distress.  Musculoskeletal: Normal range of motion.  Neurological: She is alert and oriented to person, place, and time.  Skin: Skin is warm and dry. No rash noted.  Psychiatric: Her behavior is normal. Judgment and thought content normal. Her mood appears anxious. Her speech is rapid and/or pressured. Cognition and memory are normal.    BP 117/78 (BP Location: Right Arm, Patient Position: Sitting, Cuff Size: Normal)   Pulse 86   Resp 16   Ht 5\' 4"  (1.626 m)   Wt 143 lb 3.2 oz (65 kg)   SpO2 96%   BMI 24.58 kg/m   Assessment and Plan: 1. Depression, major, recurrent, mild ([place]) Need to adjust medication Begin counseling as recommended by employer - venlafaxine XR (EFFEXOR XR) 75 MG 24 hr capsule; Take 1 capsule (75 mg total) by mouth daily with breakfast.  Dispense: 90 capsule; Refill: 0   Halina Maidens, MD [facility]  01/08/2016

## 2016-02-05 ENCOUNTER — Ambulatory Visit: Payer: 59 | Admitting: Internal Medicine

## 2016-02-05 ENCOUNTER — Ambulatory Visit (INDEPENDENT_AMBULATORY_CARE_PROVIDER_SITE_OTHER): Payer: 59 | Admitting: Internal Medicine

## 2016-02-05 ENCOUNTER — Encounter: Payer: Self-pay | Admitting: Internal Medicine

## 2016-02-05 DIAGNOSIS — F330 Major depressive disorder, recurrent, mild: Secondary | ICD-10-CM | POA: Diagnosis not present

## 2016-02-05 MED ORDER — VENLAFAXINE HCL ER 75 MG PO CP24: 75.0000 mg | ORAL_CAPSULE | Freq: Every day | ORAL | 0 refills | Status: DC

## 2016-02-05 MED ORDER — VENLAFAXINE HCL ER 150 MG PO CP24: 150.0000 mg | ORAL_CAPSULE | Freq: Every day | ORAL | 3 refills | Status: DC

## 2016-02-05 NOTE — Progress Notes (Signed)
Date:  02/05/2016   Name:  Samantha Barton   DOB:  Apr 30, 1951   MRN:  [id number]   Chief Complaint: Depression Depression         This is a chronic problem.  The problem has been gradually improving since onset.  Associated symptoms include headaches (mild).  Associated symptoms include no suicidal ideas.  Past treatments include SNRIs - Serotonin and norepinephrine reuptake inhibitors.  Compliance with treatment is good.  Previous treatment provided moderate relief. Now only working 2 days a week and does not have to interact directly with her supervisor.  She has joined a Geophysicist/field seismologist and a "planning for retirement" focus group.  She is more outgoing now and feels that the higher dose of effexor has helped.  She will also continue to see the therapist as needed.  Review of Systems  Constitutional: Negative for diaphoresis and unexpected weight change.  Eyes: Negative for visual disturbance.  Respiratory: Negative for cough, chest tightness, shortness of breath and wheezing.   Musculoskeletal: Negative for arthralgias, gait problem and joint swelling.  Neurological: Positive for headaches (mild).  Psychiatric/Behavioral: Positive for depression. Negative for dysphoric mood, sleep disturbance and suicidal ideas. The patient is not nervous/anxious and is not hyperactive.     Patient Active Problem List   Diagnosis Date Noted  . Hypertriglyceridemia 01/14/2015  . Depression, major, recurrent, mild ([place]) 01/14/2015    Prior to Admission medications   Medication Sig Start Date End Date Taking? Authorizing Provider  acetaminophen (TYLENOL) 325 MG tablet Take 650 mg by mouth every 6 (six) hours as needed.   Yes Historical Provider, MD  B Complex-C-Biotin-D-Zinc-FA (VITAL-D RX PO) Take by mouth.   Yes Historical Provider, MD  tretinoin (RETIN-A) 0.05 % cream RETIN-A, 0.05% (External Cream) - Historical Medication  (0.05 %) Active   Yes Historical Provider, MD  venlafaxine XR (EFFEXOR XR)  150 MG 24 hr capsule Take 1 capsule (150 mg total) by mouth daily. Patient taking differently: Take 150 mg by mouth every morning.  02/02/15  Yes Samantha Hess, MD  venlafaxine XR (EFFEXOR XR) 75 MG 24 hr capsule Take 1 capsule (75 mg total) by mouth daily with breakfast. 01/08/16  Yes Samantha Hess, MD    No Known Allergies  Past Surgical History:  Procedure Laterality Date  . BREAST BIOPSY Right 1998   bx/clip-neg  . ORBITAL FRACTURE SURGERY  04/25/2015  . ORIF ORBITAL FRACTURE Right 04/25/2015   Procedure: OPEN REDUCTION INTERNAL FIXATION (ORIF) ORBITAL FRACTURE;  Surgeon: Clyde Canterbury, MD;  Location: [facility] ORS;  Service: ENT;  Laterality: Right;  . SIGMOIDOSCOPY      Social History  Substance Use Topics  . Smoking status: Never Smoker  . Smokeless tobacco: Never Used  . Alcohol use No     Medication list has been reviewed and updated.   Physical Exam  Constitutional: She is oriented to person, place, and time. She appears well-developed. No distress.  HENT:  Head: Normocephalic and atraumatic.  Neck: Normal range of motion. Neck supple. Carotid bruit is not present. No thyromegaly present.  Cardiovascular: Normal rate, regular rhythm and normal heart sounds.   Pulmonary/Chest: Effort normal and breath sounds normal. No respiratory distress. She has no wheezes.  Musculoskeletal: Normal range of motion.  Lymphadenopathy:    She has no cervical adenopathy.  Neurological: She is alert and oriented to person, place, and time.  Skin: Skin is warm and dry. No rash noted.  Psychiatric: She has a  normal mood and affect. Her speech is normal and behavior is normal. Thought content normal.  Nursing note and vitals reviewed.   BP 102/64 (BP Location: Right Arm, Patient Position: Sitting, Cuff Size: Normal)   Pulse 74   Resp 16   Ht 5\' 4"  (1.626 m)   Wt 143 lb (64.9 kg)   SpO2 96%   BMI 24.55 kg/m   Assessment and Plan: 1. Depression, major, recurrent, mild  ([facility]) Continue current dosing - venlafaxine XR (EFFEXOR XR) 150 MG 24 hr capsule; Take 1 capsule (150 mg total) by mouth daily.  Dispense: 90 capsule; Refill: 3 - venlafaxine XR (EFFEXOR XR) 75 MG 24 hr capsule; Take 1 capsule (75 mg total) by mouth daily with breakfast.  Dispense: 90 capsule; Refill: 0   Halina Maidens, MD [facility]  02/05/2016

## 2016-02-05 NOTE — Patient Instructions (Signed)
Check on coverage for Cologuard - CPT code [id number].

## 2016-02-06 ENCOUNTER — Ambulatory Visit: Payer: 59 | Admitting: Internal Medicine

## 2016-03-12 ENCOUNTER — Encounter: Payer: Self-pay | Admitting: Internal Medicine

## 2016-03-12 ENCOUNTER — Ambulatory Visit (INDEPENDENT_AMBULATORY_CARE_PROVIDER_SITE_OTHER): Payer: 59 | Admitting: Internal Medicine

## 2016-03-12 VITALS — BP 120/82 | HR 84 | Resp 16 | Ht 64.0 in | Wt 143.0 lb

## 2016-03-12 DIAGNOSIS — F330 Major depressive disorder, recurrent, mild: Secondary | ICD-10-CM

## 2016-03-12 DIAGNOSIS — E781 Pure hyperglyceridemia: Secondary | ICD-10-CM

## 2016-03-12 DIAGNOSIS — Z1211 Encounter for screening for malignant neoplasm of colon: Secondary | ICD-10-CM | POA: Diagnosis not present

## 2016-03-12 DIAGNOSIS — Z23 Encounter for immunization: Secondary | ICD-10-CM

## 2016-03-12 DIAGNOSIS — Z1159 Encounter for screening for other viral diseases: Secondary | ICD-10-CM

## 2016-03-12 DIAGNOSIS — E2839 Other primary ovarian failure: Secondary | ICD-10-CM | POA: Diagnosis not present

## 2016-03-12 DIAGNOSIS — Z0000 Encounter for general adult medical examination without abnormal findings: Secondary | ICD-10-CM | POA: Diagnosis not present

## 2016-03-12 LAB — POCT URINALYSIS DIPSTICK
BILIRUBIN UA: NEGATIVE
Glucose, UA: NEGATIVE
KETONES UA: NEGATIVE
Leukocytes, UA: NEGATIVE
Nitrite, UA: NEGATIVE
PH UA: 7
Protein, UA: NEGATIVE
RBC UA: NEGATIVE
Spec Grav, UA: 1.015
Urobilinogen, UA: 0.2

## 2016-03-12 NOTE — Progress Notes (Signed)
Date:  03/12/2016   Name:  Samantha Barton   DOB:  [birth year]   MRN:  [id number]   Chief Complaint: Annual Exam Samantha Barton is a 65 y.o. female who presents today for her Complete Annual Exam. She feels fairly well. She reports exercising none. She reports she is sleeping well. Mammogram done this summer was normal.  No breast problems noted. She is doing well on Effexor but is not exercising.  She is working weekends only now.  She feels lazy but knows that exercise would be beneficial.   Review of Systems  Constitutional: Negative for chills, fatigue and fever.  HENT: Negative for congestion, hearing loss, tinnitus, trouble swallowing and voice change.   Eyes: Negative for visual disturbance.  Respiratory: Positive for shortness of breath (with exertion). Negative for cough, chest tightness and wheezing.   Cardiovascular: Negative for chest pain, palpitations and leg swelling.  Gastrointestinal: Negative for abdominal pain, constipation, diarrhea and vomiting.  Endocrine: Negative for polydipsia and polyuria.  Genitourinary: Negative for dysuria, frequency, genital sores, vaginal bleeding and vaginal discharge.  Musculoskeletal: Negative for arthralgias, gait problem and joint swelling.  Skin: Negative for color change and rash.  Neurological: Negative for dizziness, tremors, light-headedness and headaches.  Hematological: Negative.  Negative for adenopathy. Does not bruise/bleed easily.  Psychiatric/Behavioral: Negative for agitation, dysphoric mood and sleep disturbance. The patient is not nervous/anxious.     Patient Active Problem List   Diagnosis Date Noted  . Hypertriglyceridemia 01/14/2015  . 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 Authorizing Provider  acetaminophen (TYLENOL) 325 MG tablet Take 650 mg by mouth every 6 (six) hours as needed.   Yes Historical Provider, MD    B Complex-C-Biotin-D-Zinc-FA (VITAL-D RX PO) Take by mouth.   Yes Historical Provider, MD  tretinoin (RETIN-A) 0.05 % cream RETIN-A, 0.05% (External Cream) - Historical Medication  (0.05 %) Active   Yes Historical Provider, MD  venlafaxine XR (EFFEXOR XR) 150 MG 24 hr capsule Take 1 capsule (150 mg total) by mouth daily. 02/05/16  Yes Glean Hess, MD  venlafaxine XR (EFFEXOR XR) 75 MG 24 hr capsule Take 1 capsule (75 mg total) by mouth daily with breakfast. 02/05/16  Yes Glean Hess, MD    No Known Allergies  Past Surgical History:  Procedure Laterality Date  . BREAST BIOPSY Right 1998   bx/clip-neg  . ORBITAL FRACTURE SURGERY  04/25/2015  . ORIF ORBITAL FRACTURE Right 04/25/2015   Procedure: OPEN REDUCTION INTERNAL FIXATION (ORIF) ORBITAL FRACTURE;  Surgeon: Clyde Canterbury, MD;  Location: [facility] ORS;  Service: ENT;  Laterality: Right;  . SIGMOIDOSCOPY      Social History  Substance Use Topics  . Smoking status: Never Smoker  . Smokeless tobacco: Never Used  . Alcohol use No     Medication list has been reviewed and updated.   Physical Exam  Constitutional: She is oriented to person, place, and time. She appears well-developed and well-nourished. No distress.  HENT:  Head: Normocephalic and atraumatic.  Right Ear: Tympanic membrane and ear canal normal.  Left Ear: Tympanic membrane and ear canal normal.  Nose: Right sinus exhibits no maxillary sinus tenderness. Left sinus exhibits no maxillary sinus tenderness.  Mouth/Throat: Uvula is midline and oropharynx is clear and moist.  Eyes: Conjunctivae and EOM are normal. Right eye exhibits no discharge. Left eye exhibits no discharge. No scleral icterus.  Neck: Normal  range of motion. Carotid bruit is not present. No erythema present. No thyromegaly present.  Cardiovascular: Normal rate, regular rhythm, normal heart sounds and normal pulses.   Pulmonary/Chest: Effort normal. No respiratory distress. She has no wheezes.  Right breast exhibits no mass, no nipple discharge, no skin change and no tenderness. Left breast exhibits no mass, no nipple discharge, no skin change and no tenderness.  Abdominal: Soft. Bowel sounds are normal. There is no hepatosplenomegaly. There is no tenderness. There is no CVA tenderness.  Musculoskeletal: Normal range of motion.  Lymphadenopathy:    She has no cervical adenopathy.    She has no axillary adenopathy.  Neurological: She is alert and oriented to person, place, and time. She has normal reflexes. No cranial nerve deficit or sensory deficit.  Skin: Skin is warm, dry and intact. No rash noted.  Psychiatric: She has a normal mood and affect. Her speech is normal and behavior is normal. Thought content normal.  Nursing note and vitals reviewed.   BP 120/82   Pulse 84   Resp 16   Ht 5\' 4"  (1.626 m)   Wt 143 lb (64.9 kg)   SpO2 100%   BMI 24.55 kg/m   Assessment and Plan: 1. Annual physical exam Recommend regular aerobic exercise - CBC with Differential/Platelet - POCT urinalysis dipstick  2. Colon cancer screening - Cologuard  3. Need for hepatitis C screening test - Hepatitis C antibody  4. Depression, major, recurrent, mild ([place]) Controlled - continue current therapy - TSH  5. Hypertriglyceridemia - Comprehensive metabolic panel - Lipid panel  6. Need for pneumococcal vaccination - Pneumococcal conjugate vaccine 13-valent IM  7. Ovarian failure Borderline bone density in the past Continue calcium and vitamin D - DG Bone Density; Future   Samantha Maidens, MD [facility]  03/12/2016

## 2016-03-12 NOTE — Patient Instructions (Addendum)
Pneumococcal Conjugate Vaccine (PCV13)   1. Why get vaccinated?  Vaccination can protect both children and adults from pneumococcal disease.  Pneumococcal disease is caused by bacteria that can spread from person to person through close contact. It can cause ear infections, and it can also lead to more serious infections of the:  · Lungs (pneumonia),  · Blood (bacteremia), and  · Covering of the brain and spinal cord (meningitis).  Pneumococcal pneumonia is most common among adults. Pneumococcal meningitis can cause deafness and brain damage, and it kills about 1 child in 10 who get it.  Anyone can get pneumococcal disease, but children under 2 years of age and adults 65 years and older, people with certain medical conditions, and cigarette smokers are at the highest risk.  Before there was a vaccine, the [place] saw:  · more than 700 cases of meningitis,  · about 13,000 blood infections,  · about 5 million ear infections, and  · about 200 deaths  in children under 5 each year from pneumococcal disease. Since vaccine became available, severe pneumococcal disease in these children has fallen by 88%.  About 18,000 older adults die of pneumococcal disease each year in the [place].  Treatment of pneumococcal infections with penicillin and other drugs is not as effective as it used to be, because some strains of the disease have become resistant to these drugs. This makes prevention of the disease, through vaccination, even more important.  2. PCV13 vaccine  Pneumococcal conjugate vaccine (called PCV13) protects against 13 types of pneumococcal bacteria.  PCV13 is routinely given to children at 2, 4, 6, and 12-15 months of age. It is also recommended for children and adults 2 to 64 years of age with certain health conditions, and for all adults 65 years of age and older. Your doctor can give you details.  3. Some people should not get this vaccine  Anyone who has ever had a life-threatening allergic reaction  to a dose of this vaccine, to an earlier pneumococcal vaccine called PCV7, or to any vaccine containing diphtheria toxoid (for example, DTaP), should not get PCV13.  Anyone with a severe allergy to any component of PCV13 should not get the vaccine. Tell your doctor if the person being vaccinated has any severe allergies.  If the person scheduled for vaccination is not feeling well, your healthcare provider might decide to reschedule the shot on another day.  4. Risks of a vaccine reaction  With any medicine, including vaccines, there is a chance of reactions. These are usually mild and go away on their own, but serious reactions are also possible.  Problems reported following PCV13 varied by age and dose in the series. The most common problems reported among children were:  · About half became drowsy after the shot, had a temporary loss of appetite, or had redness or tenderness where the shot was given.  · About 1 out of 3 had swelling where the shot was given.  · About 1 out of 3 had a mild fever, and about 1 in 20 had a fever over 102.2°F.  · Up to about 8 out of 10 became fussy or irritable.  Adults have reported pain, redness, and swelling where the shot was given; also mild fever, fatigue, headache, chills, or muscle pain.  Young children who get PCV13 along with inactivated flu vaccine at the same time may be at increased risk for seizures caused by fever. Ask your doctor for more information.  Problems that   could happen after any vaccine:  · People sometimes faint after a medical procedure, including vaccination. Sitting or lying down for about 15 minutes can help prevent fainting, and injuries caused by a fall. Tell your doctor if you feel dizzy, or have vision changes or ringing in the ears.  · Some older children and adults get severe pain in the shoulder and have difficulty moving the arm where a shot was given. This happens very rarely.  · Any medication can cause a severe allergic reaction. Such  reactions from a vaccine are very rare, estimated at about 1 in a million doses, and would happen within a few minutes to a few hours after the vaccination.  As with any medicine, there is a very small chance of a vaccine causing a serious injury or death.  The safety of vaccines is always being monitored. For more information, visit: www.cdc.gov/vaccinesafety/  5. What if there is a serious reaction?  What should I look for?  · Look for anything that concerns you, such as signs of a severe allergic reaction, very high fever, or unusual behavior.  Signs of a severe allergic reaction can include hives, swelling of the face and throat, difficulty breathing, a fast heartbeat, dizziness, and weakness-usually within a few minutes to a few hours after the vaccination.  What should I do?  · If you think it is a severe allergic reaction or other emergency that can't wait, call 9-1-1 or get the person to the nearest hospital. Otherwise, call your doctor.  Reactions should be reported to the Vaccine Adverse Event Reporting System (VAERS). Your doctor should file this report, or you can do it yourself through the VAERS web site at www.vaers.hhs.gov, or by calling 1-800-822-7967.  VAERS does not give medical advice.  6. The National Vaccine Injury Compensation Program  The National Vaccine Injury Compensation Program (VICP) is a federal program that was created to compensate people who may have been injured by certain vaccines.  Persons who believe they may have been injured by a vaccine can learn about the program and about filing a claim by calling 1-800-338-2382 or visiting the VICP website at www.hrsa.gov/vaccinecompensation. There is a time limit to file a claim for compensation.  7. How can I learn more?  · Ask your healthcare provider. He or she can give you the vaccine package insert or suggest other sources of information.  · Call your local or state health department.  · Contact the Centers for Disease Control and  Prevention (CDC):    Call 1-800-232-4636 (1-800-CDC-INFO) or    Visit CDC's website at www.cdc.gov/vaccines  Vaccine Information Statement  PCV13 Vaccine (03/30/2014)     This information is not intended to replace advice given to you by your health care provider. Make sure you discuss any questions you have with your health care provider.     Document Released: 03/09/2006 Document Revised: 06/02/2014 Document Reviewed: 04/06/2014  Elsevier Interactive Patient Education ©2016 [employer]vier Inc.

## 2016-03-13 LAB — COMPREHENSIVE METABOLIC PANEL
A/G RATIO: 1.5 (ref 1.2–2.2)
ALK PHOS: 83 IU/L (ref 39–117)
ALT: 15 IU/L (ref 0–32)
AST: 22 IU/L (ref 0–40)
Albumin: 4.6 g/dL (ref 3.6–4.8)
BILIRUBIN TOTAL: 0.4 mg/dL (ref 0.0–1.2)
BUN/Creatinine Ratio: 21 (ref 12–28)
BUN: 16 mg/dL (ref 8–27)
CALCIUM: 10.2 mg/dL (ref 8.7–10.3)
CHLORIDE: 98 mmol/L (ref 96–106)
CO2: 28 mmol/L (ref 18–29)
Creatinine, Ser: 0.77 mg/dL (ref 0.57–1.00)
GFR calc Af Amer: 94 mL/min/{1.73_m2} (ref >59)
GFR calc non Af Amer: 81 mL/min/{1.73_m2} (ref >59)
Globulin, Total: 3 g/dL (ref 1.5–4.5)
Glucose: 97 mg/dL (ref 65–99)
POTASSIUM: 4.3 mmol/L (ref 3.5–5.2)
Sodium: 140 mmol/L (ref 134–144)
Total Protein: 7.6 g/dL (ref 6.0–8.5)

## 2016-03-13 LAB — CBC WITH DIFFERENTIAL/PLATELET
BASOS: 1 %
Basophils Absolute: 0 10*3/uL (ref 0.0–0.2)
EOS (ABSOLUTE): 0.1 10*3/uL (ref 0.0–0.4)
Eos: 2 %
Hematocrit: 42.3 % (ref 34.0–46.6)
Hemoglobin: 15 g/dL (ref 11.1–15.9)
IMMATURE GRANULOCYTES: 0 %
Immature Grans (Abs): 0 10*3/uL (ref 0.0–0.1)
Lymphocytes Absolute: 2.2 10*3/uL (ref 0.7–3.1)
Lymphs: 34 %
MCH: 33.6 pg — AB (ref 26.6–33.0)
MCHC: 35.5 g/dL (ref 31.5–35.7)
MCV: 95 fL (ref 79–97)
MONOS ABS: 0.5 10*3/uL (ref 0.1–0.9)
Monocytes: 8 %
NEUTROS PCT: 55 %
Neutrophils Absolute: 3.5 10*3/uL (ref 1.4–7.0)
PLATELETS: 295 10*3/uL (ref 150–379)
RBC: 4.47 x10E6/uL (ref 3.77–5.28)
RDW: 12.9 % (ref 12.3–15.4)
WBC: 6.4 10*3/uL (ref 3.4–10.8)

## 2016-03-13 LAB — TSH: TSH: 2.33 u[IU]/mL (ref 0.450–4.500)

## 2016-03-13 LAB — LIPID PANEL
CHOLESTEROL TOTAL: 277 mg/dL — AB (ref 100–199)
Chol/HDL Ratio: 3.6 ratio units (ref 0.0–4.4)
HDL: 77 mg/dL (ref >39)
LDL Calculated: 163 mg/dL — ABNORMAL HIGH (ref 0–99)
TRIGLYCERIDES: 183 mg/dL — AB (ref 0–149)
VLDL Cholesterol Cal: 37 mg/dL (ref 5–40)

## 2016-03-13 LAB — HEPATITIS C ANTIBODY: Hep C Virus Ab: <0.1 s/co ratio (ref 0.0–0.9)

## 2016-03-19 ENCOUNTER — Encounter: Payer: Self-pay | Admitting: Internal Medicine

## 2016-03-19 ENCOUNTER — Ambulatory Visit
Admission: RE | Admit: 2016-03-19 | Discharge: 2016-03-19 | Disposition: A | Discharge status: Home / Self Care | Payer: 59 | Source: Ambulatory Visit | Attending: Internal Medicine | Admitting: Internal Medicine

## 2016-03-19 DIAGNOSIS — M8589 Other specified disorders of bone density and structure, multiple sites: Secondary | ICD-10-CM | POA: Insufficient documentation

## 2016-03-19 DIAGNOSIS — E2839 Other primary ovarian failure: Secondary | ICD-10-CM | POA: Insufficient documentation

## 2016-03-19 DIAGNOSIS — M85852 Other specified disorders of bone density and structure, left thigh: Secondary | ICD-10-CM | POA: Diagnosis not present

## 2016-03-19 DIAGNOSIS — M8580 Other specified disorders of bone density and structure, unspecified site: Secondary | ICD-10-CM | POA: Insufficient documentation

## 2016-04-07 DIAGNOSIS — Z1211 Encounter for screening for malignant neoplasm of colon: Secondary | ICD-10-CM | POA: Diagnosis not present

## 2016-04-07 DIAGNOSIS — Z1212 Encounter for screening for malignant neoplasm of rectum: Secondary | ICD-10-CM | POA: Diagnosis not present

## 2016-04-11 LAB — COLOGUARD

## 2016-04-14 ENCOUNTER — Telehealth: Payer: Self-pay | Admitting: Internal Medicine

## 2016-04-14 NOTE — Telephone Encounter (Signed)
Let patient know that cologuard was negative.  Will repeat in three years.

## 2016-05-12 ENCOUNTER — Other Ambulatory Visit: Payer: Self-pay | Admitting: Internal Medicine

## 2016-05-12 ENCOUNTER — Telehealth: Payer: Self-pay | Admitting: Internal Medicine

## 2016-05-12 DIAGNOSIS — F330 Major depressive disorder, recurrent, mild: Secondary | ICD-10-CM

## 2016-05-12 NOTE — Telephone Encounter (Signed)
Pt called to get refill on Rx Venlafaxine

## 2016-06-05 ENCOUNTER — Encounter: Payer: 59 | Admitting: Internal Medicine

## 2016-07-09 DIAGNOSIS — Z86018 Personal history of other benign neoplasm: Secondary | ICD-10-CM | POA: Diagnosis not present

## 2016-07-09 DIAGNOSIS — D492 Neoplasm of unspecified behavior of bone, soft tissue, and skin: Secondary | ICD-10-CM | POA: Diagnosis not present

## 2016-07-09 DIAGNOSIS — Z872 Personal history of diseases of the skin and subcutaneous tissue: Secondary | ICD-10-CM | POA: Diagnosis not present

## 2016-11-25 ENCOUNTER — Other Ambulatory Visit: Payer: Self-pay | Admitting: Internal Medicine

## 2016-11-25 DIAGNOSIS — Z1231 Encounter for screening mammogram for malignant neoplasm of breast: Secondary | ICD-10-CM

## 2016-12-04 ENCOUNTER — Ambulatory Visit
Admission: RE | Admit: 2016-12-04 | Discharge: 2016-12-04 | Disposition: A | Discharge status: Home / Self Care | Payer: PPO | Source: Ambulatory Visit | Attending: Internal Medicine | Admitting: Internal Medicine

## 2016-12-04 DIAGNOSIS — Z1231 Encounter for screening mammogram for malignant neoplasm of breast: Secondary | ICD-10-CM | POA: Diagnosis not present

## 2017-02-19 ENCOUNTER — Encounter: Payer: Self-pay | Admitting: Internal Medicine

## 2017-02-19 ENCOUNTER — Ambulatory Visit (INDEPENDENT_AMBULATORY_CARE_PROVIDER_SITE_OTHER): Payer: PPO | Admitting: Internal Medicine

## 2017-02-19 VITALS — BP 108/62 | HR 85 | Ht 64.0 in | Wt 143.0 lb

## 2017-02-19 DIAGNOSIS — Z23 Encounter for immunization: Secondary | ICD-10-CM | POA: Diagnosis not present

## 2017-02-19 DIAGNOSIS — F330 Major depressive disorder, recurrent, mild: Secondary | ICD-10-CM

## 2017-02-19 MED ORDER — BUPROPION HCL ER (XL) 150 MG PO TB24: 150.0000 mg | ORAL_TABLET | Freq: Every day | ORAL | 1 refills | Status: DC

## 2017-02-19 NOTE — Patient Instructions (Signed)
Call [facility] in [place].

## 2017-02-19 NOTE — Progress Notes (Signed)
Date:  02/19/2017   Name:  Samantha Barton   DOB:  11-Aug-1950   MRN:  [id number]   Chief Complaint: Depression (Effexor no longer helping. PHQ9- 15. Having violent thoughts, and thinks about hurting herself most days.)  Depression         This is a chronic problem.  The problem occurs constantly.  The problem has been rapidly worsening since onset.  Associated symptoms include fatigue, helplessness, irritable, decreased interest and suicidal ideas.  Associated symptoms include no headaches. She's been on antidepressants for years. Most recently effect sore. She recently retired and moved to [place] and purchased a house. She remains very active keeping herself distracted with yoga classes volunteer work and concerts. None of these activities really bring her any pleasure or enjoyment she just "checks them off her list" However over the past several months she's noted several episodes with negative thoughts especially hurting herself or her dog. She has never acted on thoughts like this but they have become more worrisome. She also describes herself as a Research officer, trade union concerned over finances, physical appearance, social isolation and what she'll do in her old age.  Review of Systems  Constitutional: Positive for fatigue. Negative for chills and unexpected weight change.  Eyes: Negative for visual disturbance.  Respiratory: Negative for chest tightness, shortness of breath and wheezing.   Cardiovascular: Negative for chest pain and palpitations.  Gastrointestinal: Negative for abdominal pain.  Neurological: Negative for dizziness, tremors, weakness and headaches.  Psychiatric/Behavioral: Positive for depression, dysphoric mood, hallucinations and suicidal ideas. Negative for agitation, behavioral problems and sleep disturbance. The patient is not nervous/anxious.     Patient Active Problem List   Diagnosis Date Noted  . Osteopenia determined by x-ray 03/19/2016  . Hypertriglyceridemia  01/14/2015  . Depression, major, recurrent, mild ([place]) 01/14/2015    Prior to Admission medications   Medication Sig Start Date End Date Taking? Authorizing Provider  acetaminophen (TYLENOL) 325 MG tablet Take 650 mg by mouth every 6 (six) hours as needed.   Yes [provider]  B Complex-C-Biotin-D-Zinc-FA (VITAL-D RX PO) Take by mouth.   Yes [provider]  tretinoin (RETIN-A) 0.05 % cream RETIN-A, 0.05% (External Cream) - Historical Medication  (0.05 %) Active   Yes [provider]  venlafaxine XR (EFFEXOR XR) 150 MG 24 hr capsule Take 1 capsule (150 mg total) by mouth daily. 02/05/16  Yes Glean Hess, MD  venlafaxine XR (EFFEXOR-XR) 75 MG 24 hr capsule TAKE 1 CAPSULE BY MOUTH DAILY WITH BREAKFAST. 05/12/16  Yes Glean Hess, MD    No Known Allergies  Past Surgical History:  Procedure Laterality Date  . BREAST BIOPSY Right 1998   bx/clip-neg  . ORBITAL FRACTURE SURGERY  04/25/2015  . ORIF ORBITAL FRACTURE Right 04/25/2015   Procedure: OPEN REDUCTION INTERNAL FIXATION (ORIF) ORBITAL FRACTURE;  Surgeon: Clyde Canterbury, MD;  Location: [facility] ORS;  Service: ENT;  Laterality: Right;  . SIGMOIDOSCOPY      Social History  Substance Use Topics  . Smoking status: Never Smoker  . Smokeless tobacco: Never Used  . Alcohol use No     Medication list has been reviewed and updated.  PHQ 2/9 Scores 02/19/2017 02/05/2016 01/08/2016 02/02/2015  PHQ - 2 Score 6 2 3  0  PHQ- 9 Score 15 2 13  -    Physical Exam  Constitutional: She is oriented to person, place, and time. She appears well-developed. She is irritable. No distress.  HENT:  Head: Normocephalic and atraumatic.  Cardiovascular: Normal rate and regular rhythm.   Pulmonary/Chest: Effort normal and breath sounds normal. No respiratory distress. She has no wheezes. She has no rhonchi.  Musculoskeletal: Normal range of motion.  Neurological: She is alert and oriented to person, place, and time.  Skin:  Skin is warm and dry. No rash noted.  Psychiatric: She has a normal mood and affect. Her speech is normal and behavior is normal. Thought content normal. Cognition and memory are normal.  Nursing note and vitals reviewed.   BP 108/62 (BP Location: Right Arm, Patient Position: Sitting, Cuff Size: Normal)   Pulse 85   Ht 5\' 4"  (1.626 m)   Wt 143 lb (64.9 kg)   SpO2 99%   BMI 24.55 kg/m   Assessment and Plan: 1. Depression, major, recurrent, mild (HCC) Worsening symptoms - rec establish with psychiatry such as [facility] [place] Add bupropion - buPROPion (WELLBUTRIN XL) 150 MG 24 hr tablet; Take 1 tablet (150 mg total) by mouth daily.  Dispense: 30 tablet; Refill: 1  2. Need for influenza vaccination - Flu Vaccine QUAD 36+ mos IM   Meds ordered this encounter  Medications  . buPROPion (WELLBUTRIN XL) 150 MG 24 hr tablet    Sig: Take 1 tablet (150 mg total) by mouth daily.    Dispense:  30 tablet    Refill:  1    Partially dictated using Editor, commissioning. Any errors are unintentional.  Halina Maidens, MD [facility]  02/19/2017
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
## 2017-03-10 ENCOUNTER — Ambulatory Visit (INDEPENDENT_AMBULATORY_CARE_PROVIDER_SITE_OTHER): Payer: PPO | Admitting: Psychiatry

## 2017-03-10 ENCOUNTER — Encounter (HOSPITAL_COMMUNITY): Payer: Self-pay | Admitting: Psychiatry

## 2017-03-10 VITALS — BP 114/67 | HR 92 | Ht 64.0 in | Wt 144.4 lb

## 2017-03-10 DIAGNOSIS — G4700 Insomnia, unspecified: Secondary | ICD-10-CM

## 2017-03-10 DIAGNOSIS — F331 Major depressive disorder, recurrent, moderate: Secondary | ICD-10-CM | POA: Diagnosis not present

## 2017-03-10 DIAGNOSIS — R5383 Other fatigue: Secondary | ICD-10-CM | POA: Diagnosis not present

## 2017-03-10 DIAGNOSIS — R4584 Anhedonia: Secondary | ICD-10-CM | POA: Diagnosis not present

## 2017-03-10 MED ORDER — ARIPIPRAZOLE 2 MG PO TABS: 2.0000 mg | ORAL_TABLET | Freq: Every day | ORAL | 1 refills | Status: DC

## 2017-03-10 NOTE — Patient Instructions (Signed)
1. Continue Effexor 225 mg daily  2. Start Abilify 2 mg daily  3. Return to clinic in one month for 30 mins

## 2017-03-11 ENCOUNTER — Encounter: Payer: Self-pay | Admitting: Internal Medicine

## 2017-03-11 NOTE — Telephone Encounter (Signed)
Patient MyChart Message

## 2017-03-12 ENCOUNTER — Telehealth (HOSPITAL_COMMUNITY): Payer: Self-pay | Admitting: *Deleted

## 2017-03-12 NOTE — Telephone Encounter (Signed)
Pt pharmacy [facility] requesting 90 days supply for pt Aripiprazole 2 mg QD. Per pt chart, pt medication was last filled on 02-28-2017 with 30 tabs 1 refill. Pt pharmacy number is [phone].

## 2017-03-12 NOTE — Telephone Encounter (Signed)
Will not do it this time as we may change the dose.

## 2017-03-13 NOTE — Telephone Encounter (Signed)
noted 

## 2017-03-17 ENCOUNTER — Encounter: Payer: 59 | Admitting: Internal Medicine

## 2017-04-01 ENCOUNTER — Telehealth (HOSPITAL_COMMUNITY): Payer: Self-pay | Admitting: Psychiatry

## 2017-04-01 ENCOUNTER — Encounter (HOSPITAL_COMMUNITY): Payer: Self-pay | Admitting: Psychiatry

## 2017-04-01 ENCOUNTER — Ambulatory Visit (INDEPENDENT_AMBULATORY_CARE_PROVIDER_SITE_OTHER): Payer: PPO | Admitting: Psychiatry

## 2017-04-01 VITALS — BP 118/89 | HR 96 | Ht 64.0 in | Wt 143.0 lb

## 2017-04-01 DIAGNOSIS — F331 Major depressive disorder, recurrent, moderate: Secondary | ICD-10-CM | POA: Diagnosis not present

## 2017-04-01 DIAGNOSIS — F330 Major depressive disorder, recurrent, mild: Secondary | ICD-10-CM

## 2017-04-01 MED ORDER — VENLAFAXINE HCL ER 150 MG PO CP24
150.0000 mg | ORAL_CAPSULE | Freq: Every day | ORAL | 0 refills | Status: DC
Start: 2017-04-01 — End: 2017-05-28

## 2017-04-01 MED ORDER — VENLAFAXINE HCL ER 75 MG PO CP24
ORAL_CAPSULE | ORAL | 0 refills | Status: DC
Start: 2017-04-01 — End: 2017-05-28

## 2017-04-01 MED ORDER — LURASIDONE HCL 20 MG PO TABS: 10.0000 mg | ORAL_TABLET | Freq: Every day | ORAL | 0 refills | Status: DC

## 2017-04-01 NOTE — Patient Instructions (Addendum)
1. Continue Effexor 225 mg daily  2. Discontinue Abilify 2 mg daily  3. Start latuda 10 mg daily  4. Return to clinic in one month for 30 mins 5. You will receive contact about group therapy

## 2017-04-01 NOTE — Progress Notes (Signed)
[facility] MD/PA/NP OP Progress Note  04/01/2017 2:19 PM Samantha Barton  MRN:  [id number]  Chief Complaint:  Chief Complaint    Depression; Follow-up     HPI:  Patient presents for follow-up appointment for depression.  She states that she needed to decrease Abilify due to worsening headache.  She noticed she has less "disturbed thoughts"; she occasionally imagines what its like if she throws her dog or throws stone to a hoarse, although she adamantly denies any intent. 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 She feels "safe" at home, and states that she does not want to be rejected. Although she feels confident when she works, she feels "insecure" personally. She believes it comes from her parents, referring to the episode of their visit last week. She states that her parents does not like who she is, although she knows they love her. She feels "horrible" to feel this way. She thinks that nobody wants to be friend with her. She has fair sleep. She feels fatigue. She has decreased concentration. She feels depressed. She denies SI. She feels anxious. She denies panic attacks.   Wt Readings from Last 3 Encounters:  04/01/17 143 lb (64.9 kg)  03/10/17 144 lb 6.4 oz (65.5 kg)  02/19/17 143 lb (64.9 kg)    Visit Diagnosis:    ICD-10-CM   1. Major depressive disorder, recurrent episode, moderate (HCC) F33.1   2. Depression, major, recurrent, mild (HCC) F33.0 venlafaxine XR (EFFEXOR-XR) 75 MG 24 hr capsule    venlafaxine XR (EFFEXOR XR) 150 MG 24 hr capsule    Past Psychiatric History:  I have reviewed the patient's psychiatry history in detail and updated the patient record. Outpatient: years ago, used to see psychiatrist, therapist Psychiatry admission: once 36 year ago for feeling "unsafe"  Previous suicide attempt: denies, denies SIB Past  trials of medication: fluoxetine, citalopram, Wellbutrin (nausea, dizziness), lithium, abilify (headache) History of violence: denies  Past Medical History:  Past Medical History:  Diagnosis Date  . Arthritis   . Depression   . Fracture, orbital ([place]) 06/2015   Right Eye -Horse ACCIDENT  . Hand fracture, right 06/2015   Horse Accident     Past Surgical History:  Procedure Laterality Date  . BREAST BIOPSY Right 1998   bx/clip-neg  . ORBITAL FRACTURE SURGERY  04/25/2015  . SIGMOIDOSCOPY      Family Psychiatric History: I have reviewed the patient's family history in detail and updated the patient record.  Family History:  Family History  Problem Relation Age of Onset  . Diabetes Father   . Breast cancer Neg Hx     Social History:  Social History   Socioeconomic History  . Marital status: Single    Spouse name: None  . Number of children: None  . Years of education: None  . Highest education level: None  Social Needs  . Financial resource strain: None  . Food insecurity - worry: None  . Food insecurity - inability: None  . Transportation needs - medical: None  . Transportation needs - non-medical: None  Occupational History  . Occupation: Programmer, multimedia: [employer]  Tobacco Use  . Smoking status: Never Smoker  . Smokeless tobacco: Never Used  Substance and Sexual Activity  . Alcohol use: No    Alcohol/week: 0.0 oz  .  Drug use: No  . Sexual activity: None  Other Topics Concern  . None  Social History Narrative  . None   Lives by herself Education: Restaurant manager, fast food Work: Therapist, sports at [employer], worked as Therapist, sports for 40 years She was born and grew up in [place]. She reports her father was "never trusted" for what he says. Her mother would talk anything to other people and cannot keep secret. She has two siblings Divorced, no children    Allergies: No Known Allergies  Metabolic Disorder Labs: No results found for: HGBA1C, MPG No results found for: PROLACTIN Lab Results   Component Value Date   CHOL 277 (H) 03/12/2016   TRIG 183 (H) 03/12/2016   HDL 77 03/12/2016   CHOLHDL 3.6 03/12/2016   LDLCALC 163 (H) 03/12/2016   LDLCALC 172 (H) 02/05/2015   Lab Results  Component Value Date   TSH 2.330 03/12/2016   TSH 1.150 02/05/2015    Therapeutic Level Labs: No results found for: LITHIUM No results found for: VALPROATE No components found for:  CBMZ  Current Medications: Current Outpatient Medications  Medication Sig Dispense Refill  . B Complex-C-Biotin-D-Zinc-FA (VITAL-D RX PO) Take by mouth.    . tretinoin (RETIN-A) 0.05 % cream RETIN-A, 0.05% (External Cream) - Historical Medication  (0.05 %) Active    . venlafaxine XR (EFFEXOR XR) 150 MG 24 hr capsule Take 1 capsule (150 mg total) daily by mouth. 90 capsule 0  . venlafaxine XR (EFFEXOR-XR) 75 MG 24 hr capsule Total of 225 mg daily 90 capsule 0  . lurasidone (LATUDA) 20 MG TABS tablet Take 0.5 tablets (10 mg total) daily by mouth. 15 tablet 0   No current facility-administered medications for this visit.      Musculoskeletal: Strength & Muscle Tone: within normal limits Gait & Station: normal Patient leans: N/A  Psychiatric Specialty Exam: Review of Systems  Psychiatric/Behavioral: Positive for depression. Negative for hallucinations, memory loss, substance abuse and suicidal ideas. The patient is nervous/anxious. The patient does not have insomnia.   All other systems reviewed and are negative.   Blood pressure 118/89, pulse 96, height 5\' 4"  (1.626 m), weight 143 lb (64.9 kg).Body mass index is 24.55 kg/m.  General Appearance: Fairly Groomed  Eye Contact:  Good  Speech:  Clear and Coherent  Volume:  Normal  Mood:  Depressed  Affect:  Appropriate, Congruent and sad at times  Thought Process:  Coherent and Goal Directed  Orientation:  Full (Time, Place, and Person)  Thought Content: Logical Perceptions: denies AH/VH  Suicidal Thoughts:  No  Homicidal Thoughts:  No  Memory:   Immediate;   Good Recent;   Good Remote;   Good  Judgement:  Good  Insight:  Fair  Psychomotor Activity:  Normal  Concentration:  Concentration: Good and Attention Span: Good  Recall:  Good  Fund of Knowledge: Good  Language: Good  Akathisia:  No  Handed:  Right  AIMS (if indicated): not done  Assets:  Communication Skills Desire for Improvement  ADL's:  Intact  Cognition: WNL  Sleep:  Fair   Screenings: PHQ2-9     Office Visit from 02/19/2017 in [facility] Office Visit from 02/05/2016 in [facility] Office Visit from 01/08/2016 in [facility] Office Visit from 02/02/2015 in [facility]  PHQ-2 Total Score  6  2  3   0  PHQ-9 Total Score  15  2  13   No data       Assessment and  Plan:  Romina Penne Rosenstock is a 66 y.o. year old female with a history of depression, arthritis, who presents for follow up appointment for Major depressive disorder, recurrent episode, moderate (HCC)  Depression, major, recurrent, mild ([place]) - Plan: venlafaxine XR (EFFEXOR-XR) 75 MG 24 hr capsule, venlafaxine XR (EFFEXOR XR) 150 MG 24 hr capsule  # MDD, moderate, recurrent without psychotic features Exam is notable for continued preserved reactive of affect and there has been slight improvement in neurovegetative symptoms and occasional recurrent violent images since starting Abilify. However, she reports side effect of increased appetite and headache; will switch to latuda as adjunctive treatment for depression.  Will continue Effexor for depression.  Discussed fear of vulnerability and experiential avoidance.  Explored her value of connectedness and smallest action she can take in line with her value. She is interested in group therapy and will greatly benefit from it; will make a referral.   Plan 1. Continue Effexor 225 mg daily  2. Discontinue Abilify 2 mg daily  3. Start latuda 10 mg daily  4. Return to clinic in one month for 30 mins 5. You will receive contact  about group therapy  The patient demonstrates the following risk factors for suicide: Chronic risk factors for suicide include: psychiatric disorder of depression. Acute risk factors for suicide include: unemployment and social withdrawal/isolation. Protective factors for this patient include: coping skills and hope for the future. Considering these factors, the overall suicide risk at this point appears to be low. Patient is appropriate for outpatient follow up.  The duration of this appointment visit was 30 minutes of face-to-face time with the patient.  Greater than 50% of this time was spent in counseling, explanation of  diagnosis, planning of further management, and coordination of care.  Norman Clay, MD 04/01/2017, 2:19 PM

## 2017-04-06 ENCOUNTER — Ambulatory Visit (HOSPITAL_COMMUNITY): Payer: PPO | Admitting: Psychiatry

## 2017-04-10 ENCOUNTER — Ambulatory Visit (HOSPITAL_COMMUNITY): Payer: PPO | Admitting: Psychiatry

## 2017-04-21 NOTE — Progress Notes (Signed)
[facility] MD/PA/NP OP Progress Note  04/27/2017 11:18 AM Samantha Barton  MRN:  [id number]  Chief Complaint:  Chief Complaint    Depression; Follow-up     HPI:  Patient presents for follow up appointment for depression. She states that she does not "care" about things and feels detached, although she does not feel as depressed as before. She has been going to Yoga group, jewelry class and horse back riding. She enjoys  horse back riding as she feels some connection with animal. Although she wants to have more connection with people at yoga class, she "can't do it." Although she denies being feared of rejection, she feels that people might found her weird. She has not done meet up groups as it is "too social." She decided not to go to IOP as she may be "too good" to go there. 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 She believes that she may brag that she has two licenses and would not be good as a "patient." Discussed fear of vulnerability. She agrees to try things which is a little out of her comfort zone.   She feels depressed and "numb." She has anhedonia. She has fair appetite. She has SI without plans. She has occasional "crazy" thoughts of "pulling her eye balls," although she denies any intent. She feels anxious. She denies panic attacks. She has night time awakening after starting latuda (she has been taking 5 mg instead of 10 mg).   Wt Readings from Last 3 Encounters:  04/27/17 143 lb (64.9 kg)  04/01/17 143 lb (64.9 kg)  03/10/17 144 lb 6.4 oz (65.5 kg)    Visit Diagnosis:    ICD-10-CM   1. Major depressive disorder, recurrent episode, moderate (HCC) F33.1     Past Psychiatric History:  I have reviewed the patient's psychiatry history in detail and updated the patient record. Outpatient:years ago, used to see psychiatrist, therapist Psychiatry admission:once 36 year ago for feeling "unsafe" Previous suicide attempt:denies, denies SIB Past trials of medication:fluoxetine, citalopram,  Wellbutrin (nausea, dizziness), lithium, abilify (headache) History of violence:denies  Past Medical History:  Past Medical History:  Diagnosis Date  . Arthritis   . Depression   . Fracture, orbital ([place]) 06/2015   Right Eye -Horse ACCIDENT  . Hand fracture, right 06/2015   Horse Accident     Past Surgical History:  Procedure Laterality Date  . BREAST BIOPSY Right 1998   bx/clip-neg  . ORBITAL FRACTURE SURGERY  04/25/2015  . ORIF ORBITAL FRACTURE Right 04/25/2015   Procedure: OPEN REDUCTION INTERNAL FIXATION (ORIF) ORBITAL FRACTURE;  Surgeon: Clyde Canterbury, MD;  Location: [facility] ORS;  Service: ENT;  Laterality: Right;  . SIGMOIDOSCOPY      Family Psychiatric History: I have reviewed the patient's family history in detail and updated the patient record.  Family History:  Family History  Problem Relation Age of Onset  . Diabetes Father   . Breast cancer Neg Hx     Social History:  Social History   Socioeconomic History  . Marital status: Single    Spouse name: Not on file  . Number of children: Not on file  . Years of education: Not on file  . Highest education level: Not on file  Social Needs  . Financial resource strain: Not on file  . Food insecurity - worry: Not on file  . Food insecurity - inability: Not on file  . Transportation needs - medical: Not on file  . Transportation needs - non-medical: Not  on file  Occupational History  . Occupation: Programmer, multimedia: [employer]  Tobacco Use  . Smoking status: Never Smoker  . Smokeless tobacco: Never Used  Substance and Sexual Activity  . Alcohol use: No    Alcohol/week: 0.0 oz  . Drug use: No  . Sexual activity: Not on file  Other Topics Concern  . Not on file  Social History Narrative  . Not on file   Lives by herself Education: Restaurant manager, fast food Work: Therapist, sports at [employer], worked as Therapist, sports for 40 years She was born and grew up in [place]. She reports her father was "never trusted" for what he says. Her mother would talk  anything to other people and cannot keep secret. She has two siblings Divorced, no children    Allergies: No Known Allergies  Metabolic Disorder Labs: No results found for: HGBA1C, MPG No results found for: PROLACTIN Lab Results  Component Value Date   CHOL 277 (H) 03/12/2016   TRIG 183 (H) 03/12/2016   HDL 77 03/12/2016   CHOLHDL 3.6 03/12/2016   LDLCALC 163 (H) 03/12/2016   LDLCALC 172 (H) 02/05/2015   Lab Results  Component Value Date   TSH 2.330 03/12/2016   TSH 1.150 02/05/2015    Therapeutic Level Labs: No results found for: LITHIUM No results found for: VALPROATE No components found for:  CBMZ  Current Medications: Current Outpatient Medications  Medication Sig Dispense Refill  . B Complex-C-Biotin-D-Zinc-FA (VITAL-D RX PO) Take by mouth.    . lurasidone (LATUDA) 20 MG TABS tablet Take 0.5 tablets (10 mg total) daily by mouth. 15 tablet 0  . tretinoin (RETIN-A) 0.05 % cream RETIN-A, 0.05% (External Cream) - Historical Medication  (0.05 %) Active    . venlafaxine XR (EFFEXOR XR) 150 MG 24 hr capsule Take 1 capsule (150 mg total) daily by mouth. 90 capsule 0  . venlafaxine XR (EFFEXOR-XR) 75 MG 24 hr capsule Total of 225 mg daily 90 capsule 0   No current facility-administered medications for this visit.      Musculoskeletal: Strength & Muscle Tone: within normal limits Gait & Station: normal Patient leans: N/A  Psychiatric Specialty Exam: Review of Systems  Psychiatric/Behavioral: Positive for depression and suicidal ideas. Negative for hallucinations, memory loss and substance abuse. The patient is nervous/anxious and has insomnia.   All other systems reviewed and are negative.   Blood pressure 122/74, pulse 86, height 5\' 4"  (1.626 m), weight 143 lb (64.9 kg), SpO2 98 %.Body mass index is 24.55 kg/m.  General Appearance: Fairly Groomed  Eye Contact:  Good  Speech:  Clear and Coherent  Volume:  Normal  Mood:  Depressed  Affect:  Appropriate,  Congruent and reactive but down  Thought Process:  Coherent and Goal Directed  Orientation:  Full (Time, Place, and Person)  Thought Content: Logical Perceptions: denies AH/VH  Suicidal Thoughts:  Yes.  without intent/plan  Homicidal Thoughts:  No  Memory:  Immediate;   Good Recent;   Good Remote;   Good  Judgement:  Good  Insight:  Present  Psychomotor Activity:  Normal  Concentration:  Concentration: Good and Attention Span: Good  Recall:  Good  Fund of Knowledge: Good  Language: Good  Akathisia:  No  Handed:  Right  AIMS (if indicated): not done  Assets:  Communication Skills Desire for Improvement  ADL's:  Intact  Cognition: WNL  Sleep:  Fair   Screenings: PHQ2-9     Office Visit from 02/19/2017 in [facility] Office Visit from 02/05/2016 in [facility] Office Visit from 01/08/2016 in [facility] Office Visit from 02/02/2015 in [facility]  PHQ-2 Total Score  6  2  3   0  PHQ-9 Total Score  15  2  13   No data       Assessment and Plan:  Samantha Barton is a 66 y.o. year old female with a history of depression, arthritis, who presents for follow up appointment for Major depressive disorder, recurrent episode, moderate ([place])   #MDD, moderate, recurrent without psychotic features Although there has been slight improvement in her neurovegetative symptoms, she ruminates on dissociation and reports residual mood symptoms.  Will uptitrate Latuda as adjunctive treatment for depression, and also to target ego dystonic violent thoughts (pulling out of eye balls). Will continue Effexor for depression. Discussed cognitive defusion. Discussed fear of vulnerability. Explored her value of connectedness and value congruent action. Although referral to IOP is made, she prefers to try other group.   Plan I have reviewed and updated plans as below 1. Continue Effexor 225 mg daily  2. Increase latuda 10 mg daily  3. Return to clinic in one month for 30  mins  The patient demonstrates the following risk factors for suicide: Chronic risk factors for suicide include:psychiatric disorder ofdepression. Acute risk factorsfor suicide include: unemployment and social withdrawal/isolation. Protective factorsfor this patient include: coping skills and hope for the future. Considering these factors, the overall suicide risk at this point appears to below. Patientisappropriate for outpatient follow up.  The duration of this appointment visit was 30 minutes of face-to-face time with the patient.  Greater than 50% of this time was spent in counseling, explanation of  diagnosis, planning of further management, and coordination of care.  Norman Clay, MD 04/27/2017, 11:18 AM

## 2017-04-27 ENCOUNTER — Ambulatory Visit (INDEPENDENT_AMBULATORY_CARE_PROVIDER_SITE_OTHER): Payer: PPO | Admitting: Psychiatry

## 2017-04-27 VITALS — BP 122/74 | HR 86 | Ht 64.0 in | Wt 143.0 lb

## 2017-04-27 DIAGNOSIS — F331 Major depressive disorder, recurrent, moderate: Secondary | ICD-10-CM | POA: Diagnosis not present

## 2017-04-27 DIAGNOSIS — R450 Nervousness: Secondary | ICD-10-CM

## 2017-04-27 DIAGNOSIS — F419 Anxiety disorder, unspecified: Secondary | ICD-10-CM

## 2017-04-27 DIAGNOSIS — R4584 Anhedonia: Secondary | ICD-10-CM | POA: Diagnosis not present

## 2017-04-27 DIAGNOSIS — G4700 Insomnia, unspecified: Secondary | ICD-10-CM

## 2017-04-27 DIAGNOSIS — R45851 Suicidal ideations: Secondary | ICD-10-CM

## 2017-04-27 NOTE — Patient Instructions (Signed)
1. Continue Effexor 225 mg daily  2. Increase latuda 10 mg daily  3. Return to clinic in one month for 30 mins

## 2017-05-11 ENCOUNTER — Ambulatory Visit (HOSPITAL_COMMUNITY): Payer: PPO | Admitting: Psychiatry
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
## 2017-05-28 ENCOUNTER — Ambulatory Visit (INDEPENDENT_AMBULATORY_CARE_PROVIDER_SITE_OTHER): Payer: PPO | Admitting: Psychiatry

## 2017-05-28 ENCOUNTER — Encounter (HOSPITAL_COMMUNITY): Payer: Self-pay | Admitting: Psychiatry

## 2017-05-28 DIAGNOSIS — G4700 Insomnia, unspecified: Secondary | ICD-10-CM

## 2017-05-28 DIAGNOSIS — F330 Major depressive disorder, recurrent, mild: Secondary | ICD-10-CM | POA: Diagnosis not present

## 2017-05-28 MED ORDER — VENLAFAXINE HCL ER 75 MG PO CP24: ORAL_CAPSULE | ORAL | 0 refills | Status: DC

## 2017-05-28 MED ORDER — VENLAFAXINE HCL ER 150 MG PO CP24: 150.0000 mg | ORAL_CAPSULE | Freq: Every day | ORAL | 0 refills | Status: DC

## 2017-05-28 MED ORDER — LURASIDONE HCL 20 MG PO TABS: 20.0000 mg | ORAL_TABLET | Freq: Every day | ORAL | 0 refills | Status: DC

## 2017-05-28 NOTE — Patient Instructions (Signed)
1. Continue Effexor 225 mg daily 2. Conitnue latuda 20 mg daily  3. Return to clinic in three months for 15 mins

## 2017-07-14 DIAGNOSIS — L578 Other skin changes due to chronic exposure to nonionizing radiation: Secondary | ICD-10-CM | POA: Diagnosis not present

## 2017-07-14 DIAGNOSIS — Z86018 Personal history of other benign neoplasm: Secondary | ICD-10-CM | POA: Diagnosis not present

## 2017-07-14 DIAGNOSIS — M71342 Other bursal cyst, left hand: Secondary | ICD-10-CM | POA: Diagnosis not present
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
## 2017-08-27 ENCOUNTER — Ambulatory Visit (INDEPENDENT_AMBULATORY_CARE_PROVIDER_SITE_OTHER): Payer: PPO | Admitting: Psychiatry

## 2017-08-27 ENCOUNTER — Encounter (HOSPITAL_COMMUNITY): Payer: Self-pay | Admitting: Psychiatry

## 2017-08-27 VITALS — BP 109/70 | HR 87 | Ht 64.0 in | Wt 142.0 lb

## 2017-08-27 DIAGNOSIS — F331 Major depressive disorder, recurrent, moderate: Secondary | ICD-10-CM | POA: Diagnosis not present

## 2017-08-27 DIAGNOSIS — F330 Major depressive disorder, recurrent, mild: Secondary | ICD-10-CM

## 2017-08-27 MED ORDER — VENLAFAXINE HCL ER 75 MG PO CP24: ORAL_CAPSULE | ORAL | 0 refills | Status: DC

## 2017-08-27 MED ORDER — VENLAFAXINE HCL ER 150 MG PO CP24: 150.0000 mg | ORAL_CAPSULE | Freq: Every day | ORAL | 0 refills | Status: DC

## 2017-08-27 MED ORDER — LURASIDONE HCL 20 MG PO TABS: 20.0000 mg | ORAL_TABLET | Freq: Every day | ORAL | 0 refills | Status: DC

## 2017-08-27 NOTE — Patient Instructions (Signed)
1. Continue Effexor 225 mg daily 2.Continue Latuda 20 mg daily  3.Return to clinic in three months for 15 mins

## 2017-08-28 ENCOUNTER — Telehealth (HOSPITAL_COMMUNITY): Payer: Self-pay | Admitting: *Deleted

## 2017-08-28 ENCOUNTER — Other Ambulatory Visit (HOSPITAL_COMMUNITY): Payer: Self-pay | Admitting: Psychiatry

## 2017-08-28 MED ORDER — LURASIDONE HCL 20 MG PO TABS: 20.0000 mg | ORAL_TABLET | Freq: Every day | ORAL | 2 refills | Status: DC

## 2017-08-28 NOTE — Telephone Encounter (Signed)
Spoke with patient will come next week to pick up Latuda samples Advise to make sooner appointment & not to increase Effexor

## 2017-08-28 NOTE — Telephone Encounter (Signed)
Please discuss above with the patient. I go ahead and changed it to 30 tabs with 2 refills.

## 2017-08-28 NOTE — Telephone Encounter (Signed)
I have no idea. But she did not have any issues with that prescription before for 30 days with refills.

## 2017-08-28 NOTE — Telephone Encounter (Signed)
I have not changed any medication since the previous visit. Does she mean she does not like to continue latuda, or would like to have 30 days with 2 refills?

## 2017-08-28 NOTE — Telephone Encounter (Signed)
We could have discussed it at the visit yesterday. Does she has a coupon? If it is still expensive, provider her samples for a month and make sooner appointment in a month for 30 mins. I do not think it is good to increase effexor.

## 2017-08-28 NOTE — Telephone Encounter (Signed)
Dr Modesta Messing Patient called stating that the Latuda prescribed is to expensive. Stated she still has antidepressant & will continue until hearing from you  # [phone]

## 2017-08-28 NOTE — Telephone Encounter (Signed)
Dr Modesta Messing No answer LVM concerning Latuda. Is it more cost effective for her to do 30 tabs with 2 refill vs 90 tabs?

## 2017-08-28 NOTE — Telephone Encounter (Signed)
Latuda she stated is to expensive & that she still has the anti depressant medication & she will continue to take that until she hears from you # [phone]

## 2017-08-28 NOTE — Telephone Encounter (Signed)
Dr Modesta Messing  Patient called back stating that either way the Anette Guarneri is too expensive @ $260.00 She asked if she should just increase the Effexor?

## 2017-09-16 ENCOUNTER — Ambulatory Visit (HOSPITAL_COMMUNITY): Payer: Self-pay | Admitting: Psychiatry

## 2017-09-21 NOTE — Progress Notes (Signed)
BH MD/PA/NP OP Progress Note  09/22/2017 2:56 PM Samantha Barton  MRN:  [id number]  Chief Complaint:  Chief Complaint    Depression; Follow-up     HPI:  Patient presents for follow-up appointment for depression.  She states that she discontinued Latuda a week ago.  She cannot afford to get this medication as she is on medicare and it reached maximum out of pocket coverage. She is concerned that what medication may be tried if she has another episode. She states that she has been on antidepressant for more than 30 years. The last episode (except this time in late 2018) was when she went to [place]; she did not have access to medication/ Effexor and experienced worsening neurovegetative symptoms. She had relapses almost every two year for two months or so despite being on antidepressant. She makes sure to keep her self out of home and has some conversation with others. She feels that she would be good as long as she has a chance to talk with others. She is interested in going to peer support group. She denies insomnia.  She has good appetite.  She has good concentration.  She denies anxiety.  She denies panic attacks.  She denies SI.   Visit Diagnosis:    ICD-10-CM   1. MDD (major depressive disorder), recurrent, in full remission ([place]) F33.42     Past Psychiatric History:  I have reviewed the patient's psychiatry history in detail and updated the patient record. Outpatient:years ago, used to see psychiatrist, therapist Psychiatry admission:once 36 year ago for feeling "unsafe" Previous suicide attempt:denies, denies SIB Past trials of medication:fluoxetine, citalopram, Wellbutrin (nausea, dizziness), lithium,Abilify(headache) History of violence:denies  Past Medical History:  Past Medical History:  Diagnosis Date  . Arthritis   . Depression   . Fracture, orbital ([place]) 06/2015   Right Eye -Horse ACCIDENT  . Hand fracture, right 06/2015   Horse Accident     Past Surgical  History:  Procedure Laterality Date  . BREAST BIOPSY Right 1998   bx/clip-neg  . ORBITAL FRACTURE SURGERY  04/25/2015  . ORIF ORBITAL FRACTURE Right 04/25/2015   Procedure: OPEN REDUCTION INTERNAL FIXATION (ORIF) ORBITAL FRACTURE;  Surgeon: Clyde Canterbury, MD;  Location: [facility] ORS;  Service: ENT;  Laterality: Right;  . SIGMOIDOSCOPY      Family Psychiatric History: I have reviewed the patient's family history in detail and updated the patient record.  Family History:  Family History  Problem Relation Age of Onset  . Diabetes Father   . Breast cancer Neg Hx     Social History:  Social History   Socioeconomic History  . Marital status: Single    Spouse name: Not on file  . Number of children: Not on file  . Years of education: Not on file  . Highest education level: Not on file  Occupational History  . Occupation: Programmer, multimedia: [employer]  . Financial resource strain: Not on file  . Food insecurity:    Worry: Not on file    Inability: Not on file  . Transportation needs:    Medical: Not on file    Non-medical: Not on file  Tobacco Use  . Smoking status: Never Smoker  . Smokeless tobacco: Never Used  Substance and Sexual Activity  . Alcohol use: No    Alcohol/week: 0.0 oz  . Drug use: No  . Sexual activity: Not on file  Lifestyle  . Physical activity:    Days per week:  Not on file    Minutes per session: Not on file  . Stress: Not on file  Relationships  . Social connections:    Talks on phone: Not on file    Gets together: Not on file    Attends religious service: Not on file    Active member of club or organization: Not on file    Attends meetings of clubs or organizations: Not on file    Relationship status: Not on file  Other Topics Concern  . Not on file  Social History Narrative  . Not on file    Allergies: No Known Allergies  Metabolic Disorder Labs: No results found for: HGBA1C, MPG No results found for: PROLACTIN Lab Results   Component Value Date   CHOL 277 (H) 03/12/2016   TRIG 183 (H) 03/12/2016   HDL 77 03/12/2016   CHOLHDL 3.6 03/12/2016   LDLCALC 163 (H) 03/12/2016   LDLCALC 172 (H) 02/05/2015   Lab Results  Component Value Date   TSH 2.330 03/12/2016   TSH 1.150 02/05/2015    Therapeutic Level Labs: No results found for: LITHIUM No results found for: VALPROATE No components found for:  CBMZ  Current Medications: Current Outpatient Medications  Medication Sig Dispense Refill  . B Complex-C-Biotin-D-Zinc-FA (VITAL-D RX PO) Take by mouth.    . lurasidone (LATUDA) 20 MG TABS tablet Take 1 tablet (20 mg total) by mouth daily. 30 tablet 2  . tretinoin (RETIN-A) 0.05 % cream RETIN-A, 0.05% (External Cream) - Historical Medication  (0.05 %) Active    . venlafaxine XR (EFFEXOR XR) 150 MG 24 hr capsule Take 1 capsule (150 mg total) by mouth daily. 90 capsule 0  . venlafaxine XR (EFFEXOR-XR) 75 MG 24 hr capsule Total of 225 mg daily 90 capsule 0   No current facility-administered medications for this visit.      Musculoskeletal: Strength & Muscle Tone: within normal limits Gait & Station: normal Patient leans: N/A  Psychiatric Specialty Exam: Review of Systems  Psychiatric/Behavioral: Negative for depression, hallucinations, memory loss, substance abuse and suicidal ideas. The patient is not nervous/anxious and does not have insomnia.   All other systems reviewed and are negative.   Blood pressure 122/74, pulse 89, height 5\' 4"  (1.626 m), weight 144 lb (65.3 kg), SpO2 97 %.Body mass index is 24.72 kg/m.  General Appearance: Fairly Groomed  Eye Contact:  Good  Speech:  Clear and Coherent  Volume:  Normal  Mood:  "good"  Affect:  Appropriate, Congruent and reactive  Thought Process:  Coherent  Orientation:  Full (Time, Place, and Person)  Thought Content: Logical   Suicidal Thoughts:  No  Homicidal Thoughts:  No  Memory:  Immediate;   Good  Judgement:  Good  Insight:  Good   Psychomotor Activity:  Normal  Concentration:  Concentration: Good and Attention Span: Good  Recall:  Good  Fund of Knowledge: Good  Language: Good  Akathisia:  No  Handed:  Right  AIMS (if indicated): not done  Assets:  Communication Skills Desire for Improvement  ADL's:  Intact  Cognition: WNL  Sleep:  Good   Screenings: PHQ2-9     Office Visit from 02/19/2017 in [facility] Office Visit from 02/05/2016 in [facility] Office Visit from 01/08/2016 in [facility] Office Visit from 02/02/2015 in [facility]  PHQ-2 Total Score  6  2  3   0  PHQ-9 Total Score  15  2  13   -  Assessment and Plan:  Leilani Kechia Yahnke is a 67 y.o. year old female with a history of depression, arthritis, who presents for follow up appointment for MDD (major depressive disorder), recurrent, in full remission ([place])  # MDD, moderate, recurrent in remission Patient denies significant neurovegetative symptoms since the last appointment.  She self tapered off Latuda as she could not afford this medication. Will continue venlafaxine to target depression. She agrees to stay on venlafaxine at the current dose to avoid any relapse given patient recurrent history of depression (more than three times). Discussed in length regarding treatment option if she has any relapse in her symptoms. Medication to be considered as adjunctive treatment includes mirtazapine, TCA, Geodon (off label) or olanzapine. Discussed behavioral activation.   Plan I have reviewed and updated plans as below 1. Continue Effexor 225 mg daily 2. Discontinue latuda 3.Keep the appointment in July 4. Consider contacting [facility] in [place] for peer support group [phone]  The patient demonstrates the following risk factors for suicide: Chronic risk factors for suicide include:psychiatric disorder ofdepression. Acute risk factorsfor suicide include: unemployment and social  withdrawal/isolation. Protective factorsfor this patient include: coping skills and hope for the future. Considering these factors, the overall suicide risk at this point appears to below. Patientisappropriate for outpatient follow up.  The duration of this appointment visit was 30 minutes of face-to-face time with the patient.  Greater than 50% of this time was spent in counseling, explanation of  diagnosis, planning of further management, and coordination of care.  Norman Clay, MD 09/22/2017, 2:56 PM

## 2017-09-22 ENCOUNTER — Encounter (HOSPITAL_COMMUNITY): Payer: Self-pay | Admitting: Psychiatry

## 2017-09-22 ENCOUNTER — Ambulatory Visit (INDEPENDENT_AMBULATORY_CARE_PROVIDER_SITE_OTHER): Payer: PPO | Admitting: Psychiatry

## 2017-09-22 VITALS — BP 122/74 | HR 89 | Ht 64.0 in | Wt 144.0 lb

## 2017-09-22 DIAGNOSIS — F3342 Major depressive disorder, recurrent, in full remission: Secondary | ICD-10-CM

## 2017-09-22 NOTE — Patient Instructions (Signed)
1. Continue Effexor 225 mg daily 2. Discontinue latuda 3.Keep the appointment in July 4. Consider contacting [facility] in [place] [phone]

## 2017-11-23 NOTE — Progress Notes (Signed)
BH MD/PA/NP OP Progress Note  11/30/2017 10:51 AM Samantha Barton  MRN:  [id number]  Chief Complaint:  Chief Complaint    Depression; Follow-up     HPI:  Patient presents for follow-up appointment for depression.  She has notices that she wakes up feeling sad and tearful in the morning over the past few days.  She is concerned that the depression might be recurring again, although she is able to push through things and has become more active.  She enjoys playing tennis a few times a week and goes to yoga, book club, and horseback riding.  She did not like peers support group.  She is hoping to contact [facility] to ask if there are any groups.  She denies insomnia.  She has good appetite and motivation.  She has good concentration.  She denies SI.  She denies anxiety or panic attacks. She wonders if she can try Wellbutrin as she tried only for a few days due to side effect of dizziness.   Visit Diagnosis:    ICD-10-CM   1. Depression, major, recurrent, mild (HCC) F33.0 venlafaxine XR (EFFEXOR XR) 150 MG 24 hr capsule    venlafaxine XR (EFFEXOR-XR) 75 MG 24 hr capsule    Past Psychiatric History: Please see initial evaluation for full details. I have reviewed the history. No updates at this time.     Past Medical History:  Past Medical History:  Diagnosis Date  . Arthritis   . Depression   . Fracture, orbital ([place]) 06/2015   Right Eye -Horse ACCIDENT  . Hand fracture, right 06/2015   Horse Accident     Past Surgical History:  Procedure Laterality Date  . BREAST BIOPSY Right 1998   bx/clip-neg  . ORBITAL FRACTURE SURGERY  04/25/2015  . ORIF ORBITAL FRACTURE Right 04/25/2015   Procedure: OPEN REDUCTION INTERNAL FIXATION (ORIF) ORBITAL FRACTURE;  Surgeon: Clyde Canterbury, MD;  Location: [facility] ORS;  Service: ENT;  Laterality: Right;  . SIGMOIDOSCOPY      Family Psychiatric History: Please see initial evaluation for full details. I have reviewed the history. No updates at this time.      Family History:  Family History  Problem Relation Age of Onset  . Diabetes Father   . Breast cancer Neg Hx     Social History:  Social History   Socioeconomic History  . Marital status: Single    Spouse name: Not on file  . Number of children: Not on file  . Years of education: Not on file  . Highest education level: Not on file  Occupational History  . Occupation: Programmer, multimedia: [employer]  . Financial resource strain: Not on file  . Food insecurity:    Worry: Not on file    Inability: Not on file  . Transportation needs:    Medical: Not on file    Non-medical: Not on file  Tobacco Use  . Smoking status: Never Smoker  . Smokeless tobacco: Never Used  Substance and Sexual Activity  . Alcohol use: No    Alcohol/week: 0.0 oz  . Drug use: No  . Sexual activity: Not on file  Lifestyle  . Physical activity:    Days per week: Not on file    Minutes per session: Not on file  . Stress: Not on file  Relationships  . Social connections:    Talks on phone: Not on file    Gets together: Not on file  Attends religious service: Not on file    Active member of club or organization: Not on file    Attends meetings of clubs or organizations: Not on file    Relationship status: Not on file  Other Topics Concern  . Not on file  Social History Narrative  . Not on file    Allergies: No Known Allergies  Metabolic Disorder Labs: No results found for: HGBA1C, MPG No results found for: PROLACTIN Lab Results  Component Value Date   CHOL 277 (H) 03/12/2016   TRIG 183 (H) 03/12/2016   HDL 77 03/12/2016   CHOLHDL 3.6 03/12/2016   LDLCALC 163 (H) 03/12/2016   LDLCALC 172 (H) 02/05/2015   Lab Results  Component Value Date   TSH 2.330 03/12/2016   TSH 1.150 02/05/2015    Therapeutic Level Labs: No results found for: LITHIUM No results found for: VALPROATE No components found for:  CBMZ  Current Medications: Current Outpatient Medications   Medication Sig Dispense Refill  . B Complex-C-Biotin-D-Zinc-FA (VITAL-D RX PO) Take by mouth.    . tretinoin (RETIN-A) 0.05 % cream RETIN-A, 0.05% (External Cream) - Historical Medication  (0.05 %) Active    . venlafaxine XR (EFFEXOR XR) 150 MG 24 hr capsule Take 1 capsule (150 mg total) by mouth daily. 90 capsule 0  . venlafaxine XR (EFFEXOR-XR) 75 MG 24 hr capsule Total of 225 mg daily 90 capsule 0  . buPROPion (WELLBUTRIN) 75 MG tablet Take 1 tablet (75 mg total) by mouth 2 (two) times daily. 60 tablet 2   No current facility-administered medications for this visit.      Musculoskeletal: Strength & Muscle Tone: within normal limits Gait & Station: normal Patient leans: N/A  Psychiatric Specialty Exam: Review of Systems  Psychiatric/Behavioral: Positive for depression. Negative for hallucinations, memory loss, substance abuse and suicidal ideas. The patient is not nervous/anxious and does not have insomnia.   All other systems reviewed and are negative.   Blood pressure 118/77, pulse 84, height 5\' 4"  (1.626 m), weight 140 lb (63.5 kg), SpO2 98 %.Body mass index is 24.03 kg/m.  General Appearance: Fairly Groomed  Eye Contact:  Good  Speech:  Clear and Coherent  Volume:  Normal  Mood:  "good"  Affect:  Appropriate, Congruent and slightly tense, but reactive  Thought Process:  Coherent  Orientation:  Full (Time, Place, and Person)  Thought Content: Logical   Suicidal Thoughts:  No  Homicidal Thoughts:  No  Memory:  Immediate;   Good  Judgement:  Good  Insight:  Good  Psychomotor Activity:  Normal  Concentration:  Concentration: Good and Attention Span: Good  Recall:  Good  Fund of Knowledge: Good  Language: Good  Akathisia:  No  Handed:  Right  AIMS (if indicated): not done  Assets:  Communication Skills Desire for Improvement  ADL's:  Intact  Cognition: WNL  Sleep:  Good   Screenings: PHQ2-9     Office Visit from 02/19/2017 in [facility] Office Visit  from 02/05/2016 in [facility] Office Visit from 01/08/2016 in [facility] Office Visit from 02/02/2015 in [facility]  PHQ-2 Total Score  6  2  3   0  PHQ-9 Total Score  15  2  13   -       Assessment and Plan:  Samantha Barton is a 67 y.o. year old female with a history of depression, arthritis, who presents for follow up appointment for Depression, major, recurrent, mild ([place]) -  Plan: venlafaxine XR (EFFEXOR XR) 150 MG 24 hr capsule, venlafaxine XR (EFFEXOR-XR) 75 MG 24 hr capsule  # MDD, mild, recurrent  Patient reports slightly worsening sadness in the morning since the last appointment. Will start Wellbutrin as adjunctive treatment for depression; SR is chosen this time given her previous side effect from ER.  She has no known history of seizure or cardiac disease.  May consider adding nortriptyline at lower dose or Geodon if patient has side effects from Wellbutrin.  Will continue Effexor to target depression.  Discussed behavioral activation.   Plan I have reviewed and updated plans as below 1. Continue Effexor 225 mg daily 2. Start Wellbutrin 75 mg twice a day  3. Return to clinic in three months for 15 mins  The patient demonstrates the following risk factors for suicide: Chronic risk factors for suicide include:psychiatric disorder ofdepression. Acute risk factorsfor suicide include: unemployment and social withdrawal/isolation. Protective factorsfor this patient include: coping skills and hope for the future. Considering these factors, the overall suicide risk at this point appears to below. Patientisappropriate for outpatient follow up.  Norman Clay, MD 11/30/2017, 10:51 AM

## 2017-11-30 ENCOUNTER — Encounter (HOSPITAL_COMMUNITY): Payer: Self-pay | Admitting: Psychiatry

## 2017-11-30 ENCOUNTER — Ambulatory Visit (INDEPENDENT_AMBULATORY_CARE_PROVIDER_SITE_OTHER): Payer: PPO | Admitting: Psychiatry

## 2017-11-30 VITALS — BP 118/77 | HR 84 | Ht 64.0 in | Wt 140.0 lb

## 2017-11-30 DIAGNOSIS — F330 Major depressive disorder, recurrent, mild: Secondary | ICD-10-CM

## 2017-11-30 MED ORDER — VENLAFAXINE HCL ER 75 MG PO CP24: ORAL_CAPSULE | ORAL | 0 refills | Status: DC

## 2017-11-30 MED ORDER — VENLAFAXINE HCL ER 150 MG PO CP24: 150.0000 mg | ORAL_CAPSULE | Freq: Every day | ORAL | 0 refills | Status: DC

## 2017-11-30 MED ORDER — BUPROPION HCL 75 MG PO TABS: 75.0000 mg | ORAL_TABLET | Freq: Two times a day (BID) | ORAL | 2 refills | Status: DC

## 2017-11-30 NOTE — Patient Instructions (Signed)
1. Continue Effexor 225 mg daily 2. Start Wellbutrin 75 mg twice a day  3. Return to clinic in three months for 15 mins

## 2017-12-07 ENCOUNTER — Ambulatory Visit: Payer: PPO

## 2017-12-07 ENCOUNTER — Telehealth: Payer: Self-pay | Admitting: Internal Medicine

## 2017-12-07 NOTE — Telephone Encounter (Signed)
Called to REschedule Medicare Annual Wellness Visit with Nurse Health Advisor.   Thank you! For any questions please contact: Jill Alexanders [phone]  Or Skype me at: [email or web address]

## 2017-12-10 ENCOUNTER — Other Ambulatory Visit: Payer: Self-pay | Admitting: Internal Medicine

## 2017-12-10 DIAGNOSIS — Z1231 Encounter for screening mammogram for malignant neoplasm of breast: Secondary | ICD-10-CM

## 2017-12-15 ENCOUNTER — Encounter: Payer: Self-pay | Admitting: Internal Medicine

## 2017-12-15 ENCOUNTER — Ambulatory Visit (INDEPENDENT_AMBULATORY_CARE_PROVIDER_SITE_OTHER): Payer: PPO | Admitting: Internal Medicine

## 2017-12-15 VITALS — BP 112/72 | HR 77 | Ht 64.0 in | Wt 139.0 lb

## 2017-12-15 DIAGNOSIS — Z0000 Encounter for general adult medical examination without abnormal findings: Secondary | ICD-10-CM | POA: Diagnosis not present

## 2017-12-15 DIAGNOSIS — Z1231 Encounter for screening mammogram for malignant neoplasm of breast: Secondary | ICD-10-CM | POA: Diagnosis not present

## 2017-12-15 DIAGNOSIS — M8580 Other specified disorders of bone density and structure, unspecified site: Secondary | ICD-10-CM | POA: Diagnosis not present

## 2017-12-15 DIAGNOSIS — F331 Major depressive disorder, recurrent, moderate: Secondary | ICD-10-CM

## 2017-12-15 DIAGNOSIS — Z23 Encounter for immunization: Secondary | ICD-10-CM | POA: Diagnosis not present

## 2017-12-15 DIAGNOSIS — E781 Pure hyperglyceridemia: Secondary | ICD-10-CM | POA: Diagnosis not present

## 2017-12-15 DIAGNOSIS — Z1239 Encounter for other screening for malignant neoplasm of breast: Secondary | ICD-10-CM

## 2017-12-15 LAB — POCT URINALYSIS DIPSTICK
BILIRUBIN UA: NEGATIVE
Blood, UA: NEGATIVE
Glucose, UA: NEGATIVE
KETONES UA: NEGATIVE
Leukocytes, UA: NEGATIVE
Nitrite, UA: NEGATIVE
Protein, UA: NEGATIVE
Spec Grav, UA: 1.02 (ref 1.010–1.025)
Urobilinogen, UA: 0.2 E.U./dL
pH, UA: 6 (ref 5.0–8.0)

## 2017-12-15 NOTE — Patient Instructions (Signed)

## 2017-12-15 NOTE — Progress Notes (Signed)
Date:  12/15/2017   Name:  Samantha Barton   DOB:  03/02/1951   MRN:  [id number]   Chief Complaint: Annual Exam (Breast Exam. ) Samantha Barton is a 67 y.o. female who presents today for her Complete Annual Exam. She feels well. She reports exercising regularly - yoga, riding. She reports she is sleeping well. Mammogram is scheduled. DEXA done in 2017.   Depression         This is a new problem.  The problem has been resolved since onset.  Associated symptoms include decreased concentration.  Associated symptoms include no fatigue and no headaches.  Past treatments include SNRIs - Serotonin and norepinephrine reuptake inhibitors and other medications.  Compliance with treatment is good.  Previous treatment provided significant relief. Hip Pain   There was no injury mechanism. The pain is present in the left hip. The quality of the pain is described as aching. The pain is mild. Associated symptoms include a loss of motion.     Review of Systems  Constitutional: Negative for chills, fatigue and fever.  HENT: Negative for congestion, hearing loss, tinnitus, trouble swallowing and voice change.   Eyes: Negative for visual disturbance.  Respiratory: Negative for cough, chest tightness, shortness of breath and wheezing.   Cardiovascular: Negative for chest pain, palpitations and leg swelling.  Gastrointestinal: Negative for abdominal pain, constipation, diarrhea and vomiting.  Endocrine: Negative for polydipsia and polyuria.  Genitourinary: Negative for dysuria, frequency, genital sores, vaginal bleeding and vaginal discharge.  Musculoskeletal: Positive for arthralgias. Negative for gait problem and joint swelling.  Skin: Negative for color change and rash.  Neurological: Negative for dizziness, tremors, light-headedness and headaches.  Hematological: Negative for adenopathy. Does not bruise/bleed easily.  Psychiatric/Behavioral: Positive for decreased concentration and depression.  Negative for dysphoric mood and sleep disturbance. The patient is not nervous/anxious.     Patient Active Problem List   Diagnosis Date Noted  . Major depressive disorder, recurrent episode, moderate ([place]) 03/10/2017  . Osteopenia determined by x-ray 03/19/2016  . Hypertriglyceridemia 01/14/2015  . Depression, major, recurrent, mild ([place]) 01/14/2015    Prior to Admission medications   Medication Sig Start Date End Date Taking? Authorizing Provider  B Complex-C-Biotin-D-Zinc-FA (VITAL-D RX PO) Take by mouth.   Yes [provider]  buPROPion (WELLBUTRIN) 75 MG tablet Take 1 tablet (75 mg total) by mouth 2 (two) times daily. 11/30/17  Yes Hisada, Elie Goody, MD  tretinoin (RETIN-A) 0.05 % cream RETIN-A, 0.05% (External Cream) - Historical Medication  (0.05 %) Active   Yes [provider]  venlafaxine XR (EFFEXOR XR) 150 MG 24 hr capsule Take 1 capsule (150 mg total) by mouth daily. 11/30/17  Yes Norman Clay, MD  venlafaxine XR (EFFEXOR-XR) 75 MG 24 hr capsule Total of 225 mg daily 11/30/17  Yes Hisada, Elie Goody, MD    No Known Allergies  Past Surgical History:  Procedure Laterality Date  . BREAST BIOPSY Right 1998   bx/clip-neg  . ORBITAL FRACTURE SURGERY  04/25/2015  . ORIF ORBITAL FRACTURE Right 04/25/2015   Procedure: OPEN REDUCTION INTERNAL FIXATION (ORIF) ORBITAL FRACTURE;  Surgeon: Clyde Canterbury, MD;  Location: [facility] ORS;  Service: ENT;  Laterality: Right;  . SIGMOIDOSCOPY      Social History   Tobacco Use  . Smoking status: Never Smoker  . Smokeless tobacco: Never Used  Substance Use Topics  . Alcohol use: No    Alcohol/week: 0.0 oz  . Drug use: No     Medication list  has been reviewed and updated.  Current Meds  Medication Sig  . B Complex-C-Biotin-D-Zinc-FA (VITAL-D RX PO) Take by mouth.  Marland Kitchen buPROPion (WELLBUTRIN) 75 MG tablet Take 1 tablet (75 mg total) by mouth 2 (two) times daily.  Marland Kitchen tretinoin (RETIN-A) 0.05 % cream RETIN-A, 0.05% (External Cream) -  Historical Medication  (0.05 %) Active  . venlafaxine XR (EFFEXOR XR) 150 MG 24 hr capsule Take 1 capsule (150 mg total) by mouth daily.  Marland Kitchen venlafaxine XR (EFFEXOR-XR) 75 MG 24 hr capsule Total of 225 mg daily    PHQ 2/9 Scores 12/15/2017 02/19/2017 02/05/2016 01/08/2016  PHQ - 2 Score 0 6 2 3   PHQ- 9 Score 0 15 2 13     Physical Exam  Constitutional: She is oriented to person, place, and time. She appears well-developed and well-nourished. No distress.  HENT:  Head: Normocephalic and atraumatic.  Right Ear: Tympanic membrane and ear canal normal.  Left Ear: Tympanic membrane and ear canal normal.  Nose: Right sinus exhibits no maxillary sinus tenderness. Left sinus exhibits no maxillary sinus tenderness.  Mouth/Throat: Uvula is midline and oropharynx is clear and moist.  Eyes: Conjunctivae and EOM are normal. Right eye exhibits no discharge. Left eye exhibits no discharge. No scleral icterus.  Neck: Normal range of motion. Carotid bruit is not present. No erythema present. No thyromegaly present.  Cardiovascular: Normal rate, regular rhythm, normal heart sounds and normal pulses.  Pulmonary/Chest: Effort normal. No respiratory distress. She has no wheezes. Right breast exhibits no mass, no nipple discharge, no skin change and no tenderness. Left breast exhibits no mass, no nipple discharge, no skin change and no tenderness.  Abdominal: Soft. Bowel sounds are normal. There is no hepatosplenomegaly. There is no tenderness. There is no CVA tenderness.  Musculoskeletal: Normal range of motion.  Lymphadenopathy:    She has no cervical adenopathy.    She has no axillary adenopathy.  Neurological: She is alert and oriented to person, place, and time. She has normal reflexes. No cranial nerve deficit or sensory deficit.  Skin: Skin is warm, dry and intact. No rash noted.  Psychiatric: She has a normal mood and affect. Her speech is normal and behavior is normal. Thought content normal.  Nursing  note and vitals reviewed.  Wt Readings from Last 3 Encounters:  12/15/17 139 lb (63 kg)  02/19/17 143 lb (64.9 kg)  03/12/16 143 lb (64.9 kg)     BP 112/72   Pulse 77   Ht 5\' 4"  (1.626 m)   Wt 139 lb (63 kg)   SpO2 98%   BMI 23.86 kg/m   Assessment and Plan: 1. Annual physical exam Normal exam Continue health diet Up to date on screenings Will get DEXA next year - CBC with Differential/Platelet - Comprehensive metabolic panel - Lipid panel - POCT urinalysis dipstick  2. Breast cancer screening scheduled  3. Major depressive disorder, recurrent episode, moderate (HCC) Doing well on Effexor and Bupropion Followed by Psychiatry - TSH  4. Hypertriglyceridemia Check labs and advise - Lipid panel  5. Need for pneumococcal vaccination - Pneumococcal polysaccharide vaccine 23-valent greater than or equal to 2yo subcutaneous/IM   No orders of the defined types were placed in this encounter.   Partially dictated using Editor, commissioning. Any errors are unintentional.  Halina Maidens, MD [facility]  12/15/2017

## 2017-12-16 LAB — LIPID PANEL
Chol/HDL Ratio: 3.6 ratio (ref 0.0–4.4)
Cholesterol, Total: 246 mg/dL — ABNORMAL HIGH (ref 100–199)
HDL: 69 mg/dL (ref >39)
LDL Calculated: 144 mg/dL — ABNORMAL HIGH (ref 0–99)
Triglycerides: 163 mg/dL — ABNORMAL HIGH (ref 0–149)
VLDL Cholesterol Cal: 33 mg/dL (ref 5–40)

## 2017-12-16 LAB — COMPREHENSIVE METABOLIC PANEL
ALT: 14 IU/L (ref 0–32)
AST: 19 IU/L (ref 0–40)
Albumin/Globulin Ratio: 1.6 (ref 1.2–2.2)
Albumin: 4.6 g/dL (ref 3.6–4.8)
Alkaline Phosphatase: 89 IU/L (ref 39–117)
BUN/Creatinine Ratio: 22 (ref 12–28)
BUN: 20 mg/dL (ref 8–27)
Bilirubin Total: 0.3 mg/dL (ref 0.0–1.2)
CO2: 23 mmol/L (ref 20–29)
Calcium: 10.2 mg/dL (ref 8.7–10.3)
Chloride: 99 mmol/L (ref 96–106)
Creatinine, Ser: 0.92 mg/dL (ref 0.57–1.00)
GFR calc Af Amer: 75 mL/min/{1.73_m2} (ref >59)
GFR calc non Af Amer: 65 mL/min/{1.73_m2} (ref >59)
Globulin, Total: 2.8 g/dL (ref 1.5–4.5)
Glucose: 68 mg/dL (ref 65–99)
Potassium: 4.8 mmol/L (ref 3.5–5.2)
Sodium: 141 mmol/L (ref 134–144)
Total Protein: 7.4 g/dL (ref 6.0–8.5)

## 2017-12-16 LAB — CBC WITH DIFFERENTIAL/PLATELET
BASOS: 1 %
Basophils Absolute: 0.1 10*3/uL (ref 0.0–0.2)
EOS (ABSOLUTE): 0.1 10*3/uL (ref 0.0–0.4)
EOS: 2 %
HEMATOCRIT: 41.7 % (ref 34.0–46.6)
Hemoglobin: 13.9 g/dL (ref 11.1–15.9)
Immature Grans (Abs): 0 10*3/uL (ref 0.0–0.1)
Immature Granulocytes: 0 %
Lymphocytes Absolute: 2.3 10*3/uL (ref 0.7–3.1)
Lymphs: 36 %
MCH: 33.1 pg — ABNORMAL HIGH (ref 26.6–33.0)
MCHC: 33.3 g/dL (ref 31.5–35.7)
MCV: 99 fL — AB (ref 79–97)
MONOCYTES: 10 %
MONOS ABS: 0.6 10*3/uL (ref 0.1–0.9)
NEUTROS PCT: 51 %
Neutrophils Absolute: 3.2 10*3/uL (ref 1.4–7.0)
PLATELETS: 317 10*3/uL (ref 150–450)
RBC: 4.2 x10E6/uL (ref 3.77–5.28)
RDW: 13.2 % (ref 12.3–15.4)
WBC: 6.3 10*3/uL (ref 3.4–10.8)

## 2017-12-16 LAB — TSH: TSH: 1.94 u[IU]/mL (ref 0.450–4.500)

## 2018-01-04 ENCOUNTER — Ambulatory Visit (INDEPENDENT_AMBULATORY_CARE_PROVIDER_SITE_OTHER): Payer: PPO

## 2018-01-04 VITALS — BP 110/70 | HR 71 | Temp 98.8°F | Resp 12 | Ht 64.0 in | Wt 141.0 lb

## 2018-01-04 DIAGNOSIS — Z0000 Encounter for general adult medical examination without abnormal findings: Secondary | ICD-10-CM

## 2018-01-04 NOTE — Progress Notes (Signed)
Subjective:   Samantha Barton is a 67 y.o. female who presents for an Initial Medicare Annual Wellness Visit.  Review of Systems    N/A  Cardiac Risk Factors include: advanced age (>45men, >39 women);sedentary lifestyle     Objective:    Today's Vitals   01/04/18 1107  BP: 110/70  Pulse: 71  Resp: 12  Temp: 98.8 F (37.1 C)  TempSrc: Oral  SpO2: 98%  Weight: 141 lb (64 kg)  Height: 5\' 4"  (1.626 m)  PainSc: 4    Body mass index is 24.2 kg/m.  Advanced Directives 01/04/2018 12/15/2017 04/25/2015 04/07/2015 02/02/2015  Does Patient Have a Medical Advance Directive? Yes Yes No No No  Type of Paramedic of [place];Living will Living will - - -  Does patient want to make changes to medical advance directive? - No - Patient declined - - -  Copy of [place] in Chart? No - copy requested - - - -  Would patient like information on creating a medical advance directive? - - No - patient declined information No - patient declined information No - patient declined information    Current Medications (verified) Outpatient Encounter Medications as of 01/04/2018  Medication Sig  . buPROPion (WELLBUTRIN) 75 MG tablet Take 1 tablet (75 mg total) by mouth 2 (two) times daily.  . cholecalciferol (VITAMIN D) 1000 units tablet Take 1,000 Units by mouth daily.  Marland Kitchen tretinoin (RETIN-A) 0.05 % cream RETIN-A, 0.05% (External Cream) - Historical Medication  (0.05 %) Active  . venlafaxine XR (EFFEXOR XR) 150 MG 24 hr capsule Take 1 capsule (150 mg total) by mouth daily.  Marland Kitchen venlafaxine XR (EFFEXOR-XR) 75 MG 24 hr capsule Total of 225 mg daily  . B Complex-C-Biotin-D-Zinc-FA (VITAL-D RX PO) Take by mouth.   No facility-administered encounter medications on file as of 01/04/2018.     Allergies (verified) Patient has no known allergies.   History: Past Medical History:  Diagnosis Date  . Arthritis   . Depression   . Depression, major, recurrent, mild  ([place]) 01/14/2015  . Fracture, orbital ([place]) 06/2015   Right Eye -Horse ACCIDENT  . Hand fracture, right 06/2015   Horse Accident    Past Surgical History:  Procedure Laterality Date  . BREAST BIOPSY Right 1998   bx/clip-neg  . ORBITAL FRACTURE SURGERY  04/25/2015  . ORIF ORBITAL FRACTURE Right 04/25/2015   Procedure: OPEN REDUCTION INTERNAL FIXATION (ORIF) ORBITAL FRACTURE;  Surgeon: Clyde Canterbury, MD;  Location: [facility] ORS;  Service: ENT;  Laterality: Right;  . SIGMOIDOSCOPY     Family History  Problem Relation Age of Onset  . Diabetes Father   . Healthy Mother   . Breast cancer Neg Hx    Social History   Socioeconomic History  . Marital status: Single    Spouse name: Not on file  . Number of children: 0  . Years of education: Not on file  . Highest education level: Master's degree (e.g., MA, MS, MEng, MEd, MSW, MBA)  Occupational History  . Occupation: Retired  Scientific laboratory technician  . Financial resource strain: Not hard at all  . Food insecurity:    Worry: Never true    Inability: Never true  . Transportation needs:    Medical: No    Non-medical: No  Tobacco Use  . Smoking status: Never Smoker  . Smokeless tobacco: Never Used  . Tobacco comment: smoking cessation materials not required  Substance and Sexual Activity  . Alcohol  use: No    Alcohol/week: 0.0 standard drinks  . Drug use: No  . Sexual activity: Not Currently    Birth control/protection: Spermicide  Lifestyle  . Physical activity:    Days per week: 5 days    Minutes per session: 60 min  . Stress: Not at all  Relationships  . Social connections:    Talks on phone: Patient refused    Gets together: Patient refused    Attends religious service: Patient refused    Active member of club or organization: Patient refused    Attends meetings of clubs or organizations: Patient refused    Relationship status: Patient refused  Other Topics Concern  . Not on file  Social History Narrative  . Not on file     Tobacco Counseling Counseling given: No Comment: smoking cessation materials not required  Clinical Intake:  Pre-visit preparation completed: Yes  Pain : 0-10 Pain Score: 4  Pain Type: Chronic pain Pain Location: Buttocks Pain Orientation: Right   BMI - recorded: 24.2 Nutritional Status: BMI of 19-24  Normal Nutritional Risks: None Diabetes: No  How often do you need to have someone help you when you read instructions, pamphlets, or other written materials from your doctor or pharmacy?: 1 - Never  Interpreter Needed?: No  Information entered by :: AEversole, LPN   Activities of Daily Living In your present state of health, do you have any difficulty performing the following activities: 01/04/2018 02/19/2017  Hearing? N N  Comment B hearing aids -  Vision? N N  Comment wears eyeglasses -  Difficulty concentrating or making decisions? Y N  Comment short term memory loss -  Walking or climbing stairs? N N  Dressing or bathing? N N  Doing errands, shopping? N N  Preparing Food and eating ? N -  Comment denies dentures -  Using the Toilet? N -  In the past six months, have you accidently leaked urine? N -  Do you have problems with loss of bowel control? N -  Managing your Medications? N -  Managing your Finances? N -  Housekeeping or managing your Housekeeping? N -  Some recent data might be hidden     Immunizations and Health Maintenance Immunization History  Administered Date(s) Administered  . Influenza,inj,Quad PF,6+ Mos 02/19/2017  . Influenza-Unspecified 02/08/2016  . Pneumococcal Conjugate-13 03/12/2016  . Pneumococcal Polysaccharide-23 05/27/2009, 12/15/2017  . Tdap 05/27/2013  . Zoster 05/27/2012   Health Maintenance Due  Topic Date Due  . MAMMOGRAM  12/04/2017  . INFLUENZA VACCINE  12/24/2017    Patient Care Team: Glean Hess, MD as PCP - General (Internal Medicine) Norman Clay, MD as Consulting Physician  (Psychiatry) Jannet Mantis, MD as Consulting Physician (Dermatology)  Indicate any recent Medical Services you may have received from other than [facility] providers in the past year (date may be approximate).     Assessment:   This is a routine wellness examination for Samantha Barton.  Hearing/Vision screen Vision Screening Comments: Sees Marchelle Folks for annual eye exams  Dietary issues and exercise activities discussed: Current Exercise Habits: Structured exercise class, Type of exercise: strength training/weights;yoga, Time (Minutes): 60, Frequency (Times/Week): 5, Weekly Exercise (Minutes/Week): 300, Intensity: Mild, Exercise limited by: None identified  Goals    . DIET - INCREASE WATER INTAKE     Recommend to drink at least 6-8 8oz glasses of water per day.      Depression Screen PHQ 2/9 Scores 01/04/2018 12/15/2017 02/19/2017 02/05/2016 01/08/2016 02/02/2015  PHQ - 2 Score 0 0 6 2 3  0  PHQ- 9 Score 0 0 15 2 13  -    Fall Risk Fall Risk  01/04/2018 02/19/2017 01/08/2016 02/02/2015  Falls in the past year? No No No No  Risk for fall due to : Impaired vision - - -  Risk for fall due to: Comment wears eyeglasses - - -    FALL RISK PREVENTION PERTAINING TO HOME: Is your home free of loose throw rugs in walkways, pet beds, electrical cords, etc? Yes Is there adequate lighting in your home to reduce risk of falls?  Yes Are there stairs in or around your home WITH handrails? No stairs  ASSISTIVE DEVICES UTILIZED TO PREVENT FALLS: Use of a cane, walker or w/c? No Grab bars in the bathroom? No  Shower chair or a place to sit while bathing? No An elevated toilet seat or a handicapped toilet? No  Timed Get Up and Go Performed: Yes. Pt ambulated 10 feet within 5 sec. Gait stead-fast and without the use of an assistive device. No intervention required at this time. Fall risk prevention has been discussed.  Community Resource Referral:  Pt declined my offer to send [employer] Referral  to Care Guide for installation of grab bars in the shower, shower chair or an elevated toilet seat.  Cognitive Function:     6CIT Screen 01/04/2018  What Year? 0 points  What month? 0 points  What time? 0 points  Count back from 20 0 points  Months in reverse 0 points  Repeat phrase 0 points  Total Score 0    Screening Tests Health Maintenance  Topic Date Due  . MAMMOGRAM  12/04/2017  . INFLUENZA VACCINE  12/24/2017  . Fecal DNA (Cologuard)  04/08/2019  . TETANUS/TDAP  05/28/2023  . DEXA SCAN  Completed  . Hepatitis C Screening  Completed  . PNA vac Low Risk Adult  Completed    Qualifies for Shingles Vaccine? Yes. Zostavax completed 05/27/12. Due for Shingrix. Education has been provided regarding the importance of this vaccine. Pt has been advised to call insurance company to determine out of pocket expense. Advised may also receive vaccine at local pharmacy or Health Dept. Verbalized acceptance and understanding.  Cancer Screenings: Lung: Low Dose CT Chest recommended if Age 13-80 years, 30 pack-year currently smoking OR have quit w/in 15years. Patient does not qualify. Breast: Up to date on Mammogram? No. Completed 12/04/16. Repeat every year. Ordered 01/08/18. Scheduled for completion 01/08/18   Up to date of Bone Density/Dexa? No. Completed 03/19/16. Results reflect osteopenia. Repeat every 2 years. Requesting to wait to order this exam until 2020 Colorectal: Completed Cologuard 04/07/16. Repeat every 3 years  Additional Screenings: Hepatitis C Screening: Completed 03/12/16   Plan:  I have personally reviewed and addressed the Medicare Annual Wellness questionnaire and have noted the following in the patient's chart:  A. Medical and social history B. Use of alcohol, tobacco or illicit drugs  C. Current medications and supplements D. Functional ability and status E.  Nutritional status F.  Physical activity G. Advance directives H. List of other physicians I.   Hospitalizations, surgeries, and ER visits in previous 12 months J.  [place] such as hearing and vision if needed, cognitive and depression L. Referrals and appointments  In addition, I have reviewed and discussed with patient certain preventive protocols, quality metrics, and best practice recommendations. A written personalized care plan for preventive services as well as general preventive health  recommendations were provided to patient.  Signed,  Aleatha Borer, LPN Nurse Health Advisor  MD Recommendations: Zostavax completed 05/27/12. Due for Shingrix. Education has been provided regarding the importance of this vaccine. Pt has been advised to call insurance company to determine out of pocket expense. Advised may also receive vaccine at local pharmacy or Health Dept. Verbalized acceptance and understanding.  Mammogram: Completed 12/04/16. Repeat every year. Ordered 01/08/18. Scheduled for completion 01/08/18    Bone Density/Dexa: Completed 03/19/16. Results reflect osteopenia. Repeat every 2 years. Requesting to wait to order this exam until 2020

## 2018-01-04 NOTE — Patient Instructions (Signed)
Samantha Barton , Thank you for taking time to come for your Medicare Wellness Visit. I appreciate your ongoing commitment to your health goals. Please review the following plan we discussed and let me know if I can assist you in the future.   Screening recommendations/referrals: Colorectal Screening: Up to date Mammogram: Please keep your appointment as scheduled Bone Density: Declined  Vision and Dental Exams: Recommended annual ophthalmology exams for early detection of glaucoma and other disorders of the eye Recommended annual dental exams for proper oral hygiene  Vaccinations: Influenza vaccine: Up to date Pneumococcal vaccine: Up to date Tdap vaccine: Up to date Shingles vaccine: Please call your insurance company to determine your out of pocket expense for the Shingrix vaccine. You may also receive this vaccine at your local pharmacy or Health Dept.    Advanced directives: Please bring a copy of your POA (Power of Attorney) and/or Living Will to your next appointment.  Goals: Recommend to drink at least 6-8 8oz glasses of water per day.  Next appointment: Please schedule your Annual Wellness Visit with your Nurse Health Advisor in one year.  Preventive Care 20 Years and Older, Female Preventive care refers to lifestyle choices and visits with your health care provider that can promote health and wellness. What does preventive care include?  A yearly physical exam. This is also called an annual well check.  Dental exams once or twice a year.  Routine eye exams. Ask your health care provider how often you should have your eyes checked.  Personal lifestyle choices, including:  Daily care of your teeth and gums.  Regular physical activity.  Eating a healthy diet.  Avoiding tobacco and drug use.  Limiting alcohol use.  Practicing safe sex.  Taking low-dose aspirin every day.  Taking vitamin and mineral supplements as recommended by your health care provider. What  happens during an annual well check? The services and screenings done by your health care provider during your annual well check will depend on your age, overall health, lifestyle risk factors, and family history of disease. Counseling  Your health care provider may ask you questions about your:  Alcohol use.  Tobacco use.  Drug use.  Emotional well-being.  Home and relationship well-being.  Sexual activity.  Eating habits.  History of falls.  Memory and ability to understand (cognition).  Work and work Statistician.  Reproductive health. Screening  You may have the following tests or measurements:  Height, weight, and BMI.  Blood pressure.  Lipid and cholesterol levels. These may be checked every 5 years, or more frequently if you are over 44 years old.  Skin check.  Lung cancer screening. You may have this screening every year starting at age 9 if you have a 30-pack-year history of smoking and currently smoke or have quit within the past 15 years.  Fecal occult blood test (FOBT) of the stool. You may have this test every year starting at age 52.  Flexible sigmoidoscopy or colonoscopy. You may have a sigmoidoscopy every 5 years or a colonoscopy every 10 years starting at age 76.  Hepatitis C blood test.  Hepatitis B blood test.  Sexually transmitted disease (STD) testing.  Diabetes screening. This is done by checking your blood sugar (glucose) after you have not eaten for a while (fasting). You may have this done every 1-3 years.  Bone density scan. This is done to screen for osteoporosis. You may have this done starting at age 66.  Mammogram. This may be done every  1-2 years. Talk to your health care provider about how often you should have regular mammograms. Talk with your health care provider about your test results, treatment options, and if necessary, the need for more tests. Vaccines  Your health care provider may recommend certain vaccines, such  as:  Influenza vaccine. This is recommended every year.  Tetanus, diphtheria, and acellular pertussis (Tdap, Td) vaccine. You may need a Td booster every 10 years.  Zoster vaccine. You may need this after age 59.  Pneumococcal 13-valent conjugate (PCV13) vaccine. One dose is recommended after age 41.  Pneumococcal polysaccharide (PPSV23) vaccine. One dose is recommended after age 15. Talk to your health care provider about which screenings and vaccines you need and how often you need them. This information is not intended to replace advice given to you by your health care provider. Make sure you discuss any questions you have with your health care provider. Document Released: 06/08/2015 Document Revised: 01/30/2016 Document Reviewed: 03/13/2015 Elsevier Interactive Patient Education  2017 [place] Prevention in the Home Falls can cause injuries. They can happen to people of all ages. There are many things you can do to make your home safe and to help prevent falls. What can I do on the outside of my home?  Regularly fix the edges of walkways and driveways and fix any cracks.  Remove anything that might make you trip as you walk through a door, such as a raised step or threshold.  Trim any bushes or trees on the path to your home.  Use bright outdoor lighting.  Clear any walking paths of anything that might make someone trip, such as rocks or tools.  Regularly check to see if handrails are loose or broken. Make sure that both sides of any steps have handrails.  Any raised decks and porches should have guardrails on the edges.  Have any leaves, snow, or ice cleared regularly.  Use sand or salt on walking paths during winter.  Clean up any spills in your garage right away. This includes oil or grease spills. What can I do in the bathroom?  Use night lights.  Install grab bars by the toilet and in the tub and shower. Do not use towel bars as grab bars.  Use  non-skid mats or decals in the tub or shower.  If you need to sit down in the shower, use a plastic, non-slip stool.  Keep the floor dry. Clean up any water that spills on the floor as soon as it happens.  Remove soap buildup in the tub or shower regularly.  Attach bath mats securely with double-sided non-slip rug tape.  Do not have throw rugs and other things on the floor that can make you trip. What can I do in the bedroom?  Use night lights.  Make sure that you have a light by your bed that is easy to reach.  Do not use any sheets or blankets that are too big for your bed. They should not hang down onto the floor.  Have a firm chair that has side arms. You can use this for support while you get dressed.  Do not have throw rugs and other things on the floor that can make you trip. What can I do in the kitchen?  Clean up any spills right away.  Avoid walking on wet floors.  Keep items that you use a lot in easy-to-reach places.  If you need to reach something above you, use a strong  step stool that has a grab bar.  Keep electrical cords out of the way.  Do not use floor polish or wax that makes floors slippery. If you must use wax, use non-skid floor wax.  Do not have throw rugs and other things on the floor that can make you trip. What can I do with my stairs?  Do not leave any items on the stairs.  Make sure that there are handrails on both sides of the stairs and use them. Fix handrails that are broken or loose. Make sure that handrails are as long as the stairways.  Check any carpeting to make sure that it is firmly attached to the stairs. Fix any carpet that is loose or worn.  Avoid having throw rugs at the top or bottom of the stairs. If you do have throw rugs, attach them to the floor with carpet tape.  Make sure that you have a light switch at the top of the stairs and the bottom of the stairs. If you do not have them, ask someone to add them for you. What  else can I do to help prevent falls?  Wear shoes that:  Do not have high heels.  Have rubber bottoms.  Are comfortable and fit you well.  Are closed at the toe. Do not wear sandals.  If you use a stepladder:  Make sure that it is fully opened. Do not climb a closed stepladder.  Make sure that both sides of the stepladder are locked into place.  Ask someone to hold it for you, if possible.  Clearly mark and make sure that you can see:  Any grab bars or handrails.  First and last steps.  Where the edge of each step is.  Use tools that help you move around (mobility aids) if they are needed. These include:  Canes.  Walkers.  Scooters.  Crutches.  Turn on the lights when you go into a dark area. Replace any light bulbs as soon as they burn out.  Set up your furniture so you have a clear path. Avoid moving your furniture around.  If any of your floors are uneven, fix them.  If there are any pets around you, be aware of where they are.  Review your medicines with your doctor. Some medicines can make you feel dizzy. This can increase your chance of falling. Ask your doctor what other things that you can do to help prevent falls. This information is not intended to replace advice given to you by your health care provider. Make sure you discuss any questions you have with your health care provider. Document Released: 03/08/2009 Document Revised: 10/18/2015 Document Reviewed: 06/16/2014 Elsevier Interactive Patient Education  2017 [employer].

## 2018-01-08 ENCOUNTER — Ambulatory Visit
Admission: RE | Admit: 2018-01-08 | Discharge: 2018-01-08 | Disposition: A | Discharge status: Home / Self Care | Payer: PPO | Source: Ambulatory Visit | Attending: Internal Medicine | Admitting: Internal Medicine

## 2018-01-08 ENCOUNTER — Ambulatory Visit: Payer: Self-pay

## 2018-01-08 DIAGNOSIS — Z1231 Encounter for screening mammogram for malignant neoplasm of breast: Secondary | ICD-10-CM | POA: Diagnosis not present
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
## 2018-03-02 ENCOUNTER — Ambulatory Visit (HOSPITAL_COMMUNITY): Payer: Self-pay | Admitting: Psychiatry

## 2018-03-03 ENCOUNTER — Ambulatory Visit (INDEPENDENT_AMBULATORY_CARE_PROVIDER_SITE_OTHER): Payer: PPO | Admitting: Psychiatry

## 2018-03-03 ENCOUNTER — Encounter (HOSPITAL_COMMUNITY): Payer: Self-pay | Admitting: Psychiatry

## 2018-03-03 DIAGNOSIS — F330 Major depressive disorder, recurrent, mild: Secondary | ICD-10-CM | POA: Diagnosis not present

## 2018-03-03 MED ORDER — BUPROPION HCL 75 MG PO TABS: 75.0000 mg | ORAL_TABLET | Freq: Two times a day (BID) | ORAL | 0 refills | Status: DC

## 2018-03-03 MED ORDER — VENLAFAXINE HCL ER 75 MG PO CP24: ORAL_CAPSULE | ORAL | 0 refills | Status: DC

## 2018-03-03 MED ORDER — VENLAFAXINE HCL ER 150 MG PO CP24: 150.0000 mg | ORAL_CAPSULE | Freq: Every day | ORAL | 0 refills | Status: DC

## 2018-03-03 NOTE — Patient Instructions (Signed)
1. Continue Effexor 225 mg daily 2. Continue Wellbutrin 75 mg twice a day  3. Return to clinic in three months for 15 mins

## 2018-03-25 ENCOUNTER — Ambulatory Visit (INDEPENDENT_AMBULATORY_CARE_PROVIDER_SITE_OTHER): Payer: PPO | Admitting: Pulmonary Disease

## 2018-03-25 ENCOUNTER — Encounter: Payer: Self-pay | Admitting: Pulmonary Disease

## 2018-03-25 VITALS — BP 122/82 | HR 90

## 2018-03-25 DIAGNOSIS — R0602 Shortness of breath: Secondary | ICD-10-CM | POA: Diagnosis not present

## 2018-03-25 LAB — NITRIC OXIDE: Nitric Oxide: 63

## 2018-03-25 MED ORDER — BUDESONIDE-FORMOTEROL FUMARATE 80-4.5 MCG/ACT IN AERO: 2.0000 | INHALATION_SPRAY | Freq: Two times a day (BID) | RESPIRATORY_TRACT | 0 refills | Status: DC

## 2018-03-25 MED ORDER — ALBUTEROL SULFATE HFA 108 (90 BASE) MCG/ACT IN AERS: 2.0000 | INHALATION_SPRAY | Freq: Four times a day (QID) | RESPIRATORY_TRACT | 2 refills | Status: DC | PRN

## 2018-03-25 NOTE — Patient Instructions (Signed)
Shortness of breath Airway hyperresponsiveness  Elevated FeNO of 63 during this visit  Obtain chest x-ray Obtain PFT with methacholine challenge Obtain echocardiogram to assess cardiac function  Symbicort 80, 2 puffs twice daily Albuterol 2 puffs 4 times daily as needed for shortness of breath  I will see you back in the office in about 6 weeks

## 2018-03-25 NOTE — Addendum Note (Signed)
Addended by: Shirlee More R on: 03/25/2018 04:40 PM   Modules accepted: Orders
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
## 2018-03-25 NOTE — Addendum Note (Signed)
Addended by: Madolyn Frieze on: 03/25/2018 04:23 PM   Modules accepted: Orders

## 2018-03-26 ENCOUNTER — Other Ambulatory Visit: Payer: Self-pay

## 2018-03-26 ENCOUNTER — Telehealth: Payer: Self-pay | Admitting: Pulmonary Disease

## 2018-03-26 ENCOUNTER — Ambulatory Visit (HOSPITAL_COMMUNITY): Payer: PPO | Attending: Cardiovascular Disease

## 2018-03-26 DIAGNOSIS — R0602 Shortness of breath: Secondary | ICD-10-CM

## 2018-03-26 MED ORDER — FLUTICASONE-SALMETEROL 100-50 MCG/DOSE IN AEPB: 1.0000 | INHALATION_SPRAY | Freq: Two times a day (BID) | RESPIRATORY_TRACT | 1 refills | Status: DC

## 2018-03-26 NOTE — Telephone Encounter (Signed)
Called and spoke with Patient.  Patient stated that she saw Dr. Ander Slade 10/31, and he started her on Symbicort 80.  She stated that she gags after she uses her Symbicort.  She is requesting to be changed to Advair.  She stated that she used Advair in the past and had no problems.    Will route to Tammy P  No Known Allergies   Current Outpatient Medications on File Prior to Visit  Medication Sig Dispense Refill  . albuterol (PROVENTIL HFA;VENTOLIN HFA) 108 (90 Base) MCG/ACT inhaler Inhale 2 puffs into the lungs every 6 (six) hours as needed for wheezing or shortness of breath. 1 Inhaler 2  . budesonide-formoterol (SYMBICORT) 80-4.5 MCG/ACT inhaler Inhale 2 puffs into the lungs 2 (two) times daily. 1 Inhaler 0  . buPROPion (WELLBUTRIN) 75 MG tablet Take 1 tablet (75 mg total) by mouth 2 (two) times daily. 180 tablet 0  . cholecalciferol (VITAMIN D) 1000 units tablet Take 1,000 Units by mouth daily.    Marland Kitchen tretinoin (RETIN-A) 0.05 % cream RETIN-A, 0.05% (External Cream) - Historical Medication  (0.05 %) Active    . venlafaxine XR (EFFEXOR XR) 150 MG 24 hr capsule Take 1 capsule (150 mg total) by mouth daily. 90 capsule 0  . venlafaxine XR (EFFEXOR-XR) 75 MG 24 hr capsule Total of 225 mg daily 90 capsule 0   No current facility-administered medications on file prior to visit.

## 2018-03-26 NOTE — Telephone Encounter (Signed)
That is fine with me  Can send Advair 100 1 puff Twice daily  , rinse well after use  #1 , 1 refill  Please forward note DR. Olalere to make him aware .

## 2018-03-26 NOTE — Telephone Encounter (Signed)
Per TP-  That is fine with me  Can send Advair 100 1 puff Twice daily  , rinse well after use  #1 , 1 refill  Please forward note DR. Olalere to make him aware .   Called and spoke with Patient.  TP recommendations given.  Understanding stated. Patient requested Advair Diskus to be sent to [facility] on [address]. Prescription sent.  Nothing further at this time.  Will route to Dr. Ander Slade as Juluis Rainier

## 2018-04-09 ENCOUNTER — Encounter (HOSPITAL_COMMUNITY): Payer: Self-pay

## 2018-04-12 ENCOUNTER — Ambulatory Visit (HOSPITAL_COMMUNITY)
Admission: RE | Admit: 2018-04-12 | Discharge: 2018-04-12 | Disposition: A | Discharge status: Home / Self Care | Payer: PPO | Source: Ambulatory Visit | Attending: Pulmonary Disease | Admitting: Pulmonary Disease

## 2018-04-12 DIAGNOSIS — R0602 Shortness of breath: Secondary | ICD-10-CM | POA: Insufficient documentation

## 2018-04-12 NOTE — Progress Notes (Signed)
Patient here for methacholine challenge.  Patient attempted to place mouthpiece in mouth, and nose clip on nose.  Immediately after placing, patient started gagging.  Samantha Barton attempted twice to see if Samantha Barton could perform the test.  Samantha Barton was unable to have mouthpiece in her mouth, as Samantha Barton would gag each time.  Patient left without having test performed.

## 2018-05-04 ENCOUNTER — Telehealth: Payer: Self-pay | Admitting: Pulmonary Disease

## 2018-05-04 NOTE — Telephone Encounter (Signed)
I have called the patient she has an apt tomorrow I wanted to see why her methocline challenge was not done?  lmom to call back

## 2018-05-05 ENCOUNTER — Ambulatory Visit (INDEPENDENT_AMBULATORY_CARE_PROVIDER_SITE_OTHER): Payer: PPO | Admitting: Pulmonary Disease

## 2018-05-05 ENCOUNTER — Encounter: Payer: Self-pay | Admitting: Pulmonary Disease

## 2018-05-05 VITALS — BP 102/68 | HR 106 | Ht 64.0 in | Wt 140.0 lb

## 2018-05-05 DIAGNOSIS — R0602 Shortness of breath: Secondary | ICD-10-CM

## 2018-05-05 LAB — NITRIC OXIDE: NITRIC OXIDE: 26

## 2018-05-05 MED ORDER — BUDESONIDE-FORMOTEROL FUMARATE 80-4.5 MCG/ACT IN AERO: 2.0000 | INHALATION_SPRAY | Freq: Two times a day (BID) | RESPIRATORY_TRACT | 6 refills | Status: DC

## 2018-05-05 NOTE — Telephone Encounter (Signed)
Noted. Will route to Dr. Ander Slade and Nira Conn as an FYi in regards to the methocline challenge test.

## 2018-05-05 NOTE — Telephone Encounter (Signed)
Noted will close message.
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
## 2018-05-05 NOTE — Patient Instructions (Signed)
Shortness of breath-stable  Symbicort- ?may be helping a little I will suggest that we continue Symbicort at present  We can hold off on further testing, cancel methacholine challenge testing  Use albuterol as needed  I will see you back in the office in about 6 months Call with any significant concerns

## 2018-05-05 NOTE — Telephone Encounter (Signed)
Patient states she wasn't able to do the test because she became ill and had to stop; patient states she will be in the office for appointment and will further discuss with nurse when she comes in

## 2018-05-28 ENCOUNTER — Other Ambulatory Visit (HOSPITAL_COMMUNITY): Payer: Self-pay | Admitting: Psychiatry

## 2018-05-28 DIAGNOSIS — F330 Major depressive disorder, recurrent, mild: Secondary | ICD-10-CM

## 2018-05-28 MED ORDER — VENLAFAXINE HCL ER 75 MG PO CP24: ORAL_CAPSULE | ORAL | 0 refills | Status: DC

## 2018-05-28 MED ORDER — BUPROPION HCL 75 MG PO TABS: 75.0000 mg | ORAL_TABLET | Freq: Two times a day (BID) | ORAL | 0 refills | Status: DC

## 2018-05-28 MED ORDER — VENLAFAXINE HCL ER 150 MG PO CP24: 150.0000 mg | ORAL_CAPSULE | Freq: Every day | ORAL | 0 refills | Status: DC

## 2018-06-03 ENCOUNTER — Ambulatory Visit (HOSPITAL_COMMUNITY): Payer: PPO | Admitting: Psychiatry

## 2018-06-22 NOTE — Progress Notes (Signed)
[facility] MD/PA/NP OP Progress Note  06/24/2018 11:05 AM Samantha Barton  MRN:  [id number]  Chief Complaint:  Chief Complaint    Follow-up; Depression     HPI:  Patient presents for follow-up appointment for depression.  She states that she has been doing very well.  Some of her friend invited the patient to Thanksgiving dinner.  She stays active and plays tennis.  Although she went to group therapy at [facility], there was only other man with significant anxiety.  She goes to book club.  She wants to try bupropion extended release again as she tends to forget the evening dose.  She is aware that she had side effect of dizziness when she tried previously.  She denies insomnia.  She has good appetite.  She has good concentration.  She denies SI.  She denies anxiety or panic attacks.    Visit Diagnosis:    ICD-10-CM   1. Recurrent major depressive disorder, in full remission ([place]) F33.42 venlafaxine XR (EFFEXOR XR) 150 MG 24 hr capsule    venlafaxine XR (EFFEXOR-XR) 75 MG 24 hr capsule    Past Psychiatric History: Please see initial evaluation for full details. I have reviewed the history. No updates at this time.     Past Medical History:  Past Medical History:  Diagnosis Date  . Arthritis   . Depression   . Depression, major, recurrent, mild ([place]) 01/14/2015  . Fracture, orbital 06/2015   Right Eye -Horse ACCIDENT  . Hand fracture, right 06/2015   Horse Accident     Past Surgical History:  Procedure Laterality Date  . BREAST BIOPSY Right 1998   bx/clip-neg  . ORBITAL FRACTURE SURGERY  04/25/2015  . ORIF ORBITAL FRACTURE Right 04/25/2015   Procedure: OPEN REDUCTION INTERNAL FIXATION (ORIF) ORBITAL FRACTURE;  Surgeon: Clyde Canterbury, MD;  Location: [facility] ORS;  Service: ENT;  Laterality: Right;  . SIGMOIDOSCOPY      Family Psychiatric History: Please see initial evaluation for full details. I have reviewed the history. No updates at this time.     Family History:  Family History   Problem Relation Age of Onset  . Diabetes Father   . Healthy Mother   . Breast cancer Neg Hx     Social History:  Social History   Socioeconomic History  . Marital status: Single    Spouse name: Not on file  . Number of children: 0  . Years of education: Not on file  . Highest education level: Master's degree (e.g., MA, MS, MEng, MEd, MSW, MBA)  Occupational History  . Occupation: Retired  Scientific laboratory technician  . Financial resource strain: Not hard at all  . Food insecurity:    Worry: Never true    Inability: Never true  . Transportation needs:    Medical: No    Non-medical: No  Tobacco Use  . Smoking status: Never Smoker  . Smokeless tobacco: Never Used  . Tobacco comment: smoking cessation materials not required  Substance and Sexual Activity  . Alcohol use: No    Alcohol/week: 0.0 standard drinks  . Drug use: No  . Sexual activity: Not Currently    Birth control/protection: Spermicide  Lifestyle  . Physical activity:    Days per week: 5 days    Minutes per session: 60 min  . Stress: Not at all  Relationships  . Social connections:    Talks on phone: Patient refused    Gets together: Patient refused    Attends religious service: Patient refused  Active member of club or organization: Patient refused    Attends meetings of clubs or organizations: Patient refused    Relationship status: Patient refused  Other Topics Concern  . Not on file  Social History Narrative  . Not on file    Allergies: No Known Allergies  Metabolic Disorder Labs: No results found for: HGBA1C, MPG No results found for: PROLACTIN Lab Results  Component Value Date   CHOL 246 (H) 12/15/2017   TRIG 163 (H) 12/15/2017   HDL 69 12/15/2017   CHOLHDL 3.6 12/15/2017   LDLCALC 144 (H) 12/15/2017   LDLCALC 163 (H) 03/12/2016   Lab Results  Component Value Date   TSH 1.940 12/15/2017   TSH 2.330 03/12/2016    Therapeutic Level Labs: No results found for: LITHIUM No results found for:  VALPROATE No components found for:  CBMZ  Current Medications: Current Outpatient Medications  Medication Sig Dispense Refill  . albuterol (PROVENTIL HFA;VENTOLIN HFA) 108 (90 Base) MCG/ACT inhaler Inhale 2 puffs into the lungs every 6 (six) hours as needed for wheezing or shortness of breath. 1 Inhaler 2  . budesonide-formoterol (SYMBICORT) 80-4.5 MCG/ACT inhaler Inhale 2 puffs into the lungs 2 (two) times daily. 1 Inhaler 0  . budesonide-formoterol (SYMBICORT) 80-4.5 MCG/ACT inhaler Inhale 2 puffs into the lungs 2 (two) times daily. 1 Inhaler 6  . buPROPion (WELLBUTRIN) 75 MG tablet Take 1 tablet (75 mg total) by mouth 2 (two) times daily. 180 tablet 0  . cholecalciferol (VITAMIN D) 1000 units tablet Take 1,000 Units by mouth daily.    . Fluticasone-Salmeterol (ADVAIR DISKUS) 100-50 MCG/DOSE AEPB Inhale 1 puff into the lungs 2 (two) times daily. 60 each 1  . tretinoin (RETIN-A) 0.05 % cream RETIN-A, 0.05% (External Cream) - Historical Medication  (0.05 %) Active    . [START ON 08/27/2018] venlafaxine XR (EFFEXOR XR) 150 MG 24 hr capsule Take 1 capsule (150 mg total) by mouth daily. 90 capsule 0  . [START ON 08/26/2018] venlafaxine XR (EFFEXOR-XR) 75 MG 24 hr capsule Total of 225 mg daily 90 capsule 0  . buPROPion (WELLBUTRIN XL) 150 MG 24 hr tablet Take 1 tablet (150 mg total) by mouth daily. 90 tablet 1   No current facility-administered medications for this visit.      Musculoskeletal: Strength & Muscle Tone: within normal limits Gait & Station: normal Patient leans: N/A  Psychiatric Specialty Exam: Review of Systems  Psychiatric/Behavioral: Negative for depression, hallucinations, memory loss, substance abuse and suicidal ideas. The patient is not nervous/anxious and does not have insomnia.   All other systems reviewed and are negative.   Blood pressure 107/65, pulse 95, height 5\' 4"  (1.626 m), weight 139 lb (63 kg), SpO2 98 %.Body mass index is 23.86 kg/m.  General Appearance:  Fairly Groomed  Eye Contact:  Good  Speech:  Clear and Coherent  Volume:  Normal  Mood:  "good"  Affect:  Appropriate and Congruent  Thought Process:  Coherent  Orientation:  Full (Time, Place, and Person)  Thought Content: Logical   Suicidal Thoughts:  No  Homicidal Thoughts:  No  Memory:  Immediate;   Good  Judgement:  Good  Insight:  Good  Psychomotor Activity:  Normal  Concentration:  Concentration: Good and Attention Span: Good  Recall:  Good  Fund of Knowledge: Good  Language: Good  Akathisia:  No  Handed:  Right  AIMS (if indicated): not done  Assets:  Communication Skills Desire for Improvement  ADL's:  Intact  Cognition: WNL  Sleep:  Good   Screenings: PHQ2-9     Clinical Support from 01/04/2018 in [facility] Office Visit from 12/15/2017 in [facility] Office Visit from 02/19/2017 in [facility] Office Visit from 02/05/2016 in [facility] Office Visit from 01/08/2016 in [facility]  PHQ-2 Total Score  0  0  6  2  3   PHQ-9 Total Score  0  0  15  2  13        Assessment and Plan:  Nikiyah Lesli Issa is a 68 y.o. year old female with a history of depression, arthritis, who presents for follow up appointment for Recurrent major depressive disorder, in full remission ([place]) - Plan: venlafaxine XR (EFFEXOR XR) 150 MG 24 hr capsule, venlafaxine XR (EFFEXOR-XR) 75 MG 24 hr capsule  # MDD in remission Patient denies significant mood symptoms since the last visit.  She prefers to switch to longer acting up a problem again to ensure adherence.  Will switch to bupropion XR to target depression.  She is advised to contact the office if she experiences any side effect.  She has no known history of seizure or cardiac disease.  Will continue venlafaxine to target depression.  Discussed behavioral activation.   Plan 1. Continue Effexor 225 mg daily 2. Change bupropion 150 mg daily (used to be on Wellbutrin IR 75 mg twice a day)  3.  Return to clinic in four times for 15 mins  The patient demonstrates the following risk factors for suicide: Chronic risk factors for suicide include:psychiatric disorder ofdepression. Acute risk factorsfor suicide include: unemployment and social withdrawal/isolation. Protective factorsfor this patient include: coping skills and hope for the future. Considering these factors, the overall suicide risk at this point appears to below. Patientisappropriate for outpatient follow up.  The duration of this appointment visit was 15 minutes of face-to-face time with the patient.  Greater than 50% of this time was spent in counseling, explanation of  diagnosis, planning of further management, and coordination of care.  Norman Clay, MD 06/24/2018, 11:05 AM

## 2018-06-24 ENCOUNTER — Ambulatory Visit (INDEPENDENT_AMBULATORY_CARE_PROVIDER_SITE_OTHER): Payer: PPO | Admitting: Psychiatry

## 2018-06-24 ENCOUNTER — Encounter (HOSPITAL_COMMUNITY): Payer: Self-pay | Admitting: Psychiatry

## 2018-06-24 DIAGNOSIS — F3342 Major depressive disorder, recurrent, in full remission: Secondary | ICD-10-CM | POA: Diagnosis not present

## 2018-06-24 MED ORDER — VENLAFAXINE HCL ER 150 MG PO CP24: 150.0000 mg | ORAL_CAPSULE | Freq: Every day | ORAL | 0 refills | Status: DC

## 2018-06-24 MED ORDER — VENLAFAXINE HCL ER 75 MG PO CP24: ORAL_CAPSULE | ORAL | 0 refills | Status: DC

## 2018-06-24 MED ORDER — BUPROPION HCL ER (XL) 150 MG PO TB24: 150.0000 mg | ORAL_TABLET | Freq: Every day | ORAL | 1 refills | Status: DC

## 2018-06-24 NOTE — Patient Instructions (Addendum)
1. Continue Effexor 225 mg daily 2. Change to bupropion 150 mg daily  3. Return to clinic in four times for 15 mins

## 2018-07-02 DIAGNOSIS — H903 Sensorineural hearing loss, bilateral: Secondary | ICD-10-CM | POA: Diagnosis not present

## 2018-09-27 ENCOUNTER — Other Ambulatory Visit (HOSPITAL_COMMUNITY): Payer: Self-pay | Admitting: Psychiatry

## 2018-09-27 DIAGNOSIS — F3342 Major depressive disorder, recurrent, in full remission: Secondary | ICD-10-CM

## 2018-10-07 DIAGNOSIS — L821 Other seborrheic keratosis: Secondary | ICD-10-CM | POA: Diagnosis not present

## 2018-10-07 DIAGNOSIS — L728 Other follicular cysts of the skin and subcutaneous tissue: Secondary | ICD-10-CM | POA: Diagnosis not present

## 2018-10-07 DIAGNOSIS — L814 Other melanin hyperpigmentation: Secondary | ICD-10-CM | POA: Diagnosis not present

## 2018-10-07 DIAGNOSIS — D225 Melanocytic nevi of trunk: Secondary | ICD-10-CM | POA: Diagnosis not present

## 2018-10-07 DIAGNOSIS — L720 Epidermal cyst: Secondary | ICD-10-CM | POA: Diagnosis not present

## 2018-10-15 NOTE — Progress Notes (Signed)
Virtual Visit via Video Note  I connected with Samantha Barton on 10/21/18 at 10:30 AM EDT by a video enabled telemedicine application and verified that I am speaking with the correct person using two identifiers.   I discussed the limitations of evaluation and management by telemedicine and the availability of in person appointments. The patient expressed understanding and agreed to proceed.    I discussed the assessment and treatment plan with the patient. The patient was provided an opportunity to ask questions and all were answered. The patient agreed with the plan and demonstrated an understanding of the instructions.   The patient was advised to call back or seek an in-person evaluation if the symptoms worsen or if the condition fails to improve as anticipated.  I provided 15 minutes of non-face-to-face time during this encounter.   Norman Clay, MD    [facility] MD/PA/NP OP Progress Note  10/21/2018 11:01 AM Samantha Barton  MRN:  [id number]  Chief Complaint:  Chief Complaint    Follow-up; Depression     HPI:  This is a follow-up appointment for depression.  She states that she has been doing very well.  Although she misses social interaction, she has been managing it well by keeping herself busy.  She enjoys doing gardening and taking a walk.  She tries to find at least one task every day.  She finished classes before this pandemic.  She hopes to get back on horse riding and playing tennis after this pandemic is resolved. She had a good Mother's Day; she contacted with her mother who lives in [place].  Her family in [place] are doing well.  She denies insomnia.  She denies feeling depressed or fatigue.  She has good concentration.  She denies anhedonia.  She denies SI.  She denies anxiety or panic attacks. After discussing an option of tapering down of medication, she reports preference to stay on her current medication regimen. She denies any side effect after switching from  bupropion IR to XR.  Wt Readings from Last 3 Encounters:  06/24/18 139 lb (63 kg)  05/05/18 140 lb (63.5 kg)  03/03/18 140 lb (63.5 kg)     Visit Diagnosis:    ICD-10-CM   1. Recurrent major depressive disorder, in full remission ([place]) F33.42 venlafaxine XR (EFFEXOR-XR) 75 MG 24 hr capsule    venlafaxine XR (EFFEXOR XR) 150 MG 24 hr capsule    Past Psychiatric History: Please see initial evaluation for full details. I have reviewed the history. No updates at this time.     Past Medical History:  Past Medical History:  Diagnosis Date  . Arthritis   . Depression   . Depression, major, recurrent, mild ([place]) 01/14/2015  . Fracture, orbital 06/2015   Right Eye -Horse ACCIDENT  . Hand fracture, right 06/2015   Horse Accident     Past Surgical History:  Procedure Laterality Date  . BREAST BIOPSY Right 1998   bx/clip-neg  . ORBITAL FRACTURE SURGERY  04/25/2015  . ORIF ORBITAL FRACTURE Right 04/25/2015   Procedure: OPEN REDUCTION INTERNAL FIXATION (ORIF) ORBITAL FRACTURE;  Surgeon: Clyde Canterbury, MD;  Location: [facility] ORS;  Service: ENT;  Laterality: Right;  . SIGMOIDOSCOPY      Family Psychiatric History: Please see initial evaluation for full details. I have reviewed the history. No updates at this time.     Family History:  Family History  Problem Relation Age of Onset  . Diabetes Father   . Healthy Mother   .  Breast cancer Neg Hx     Social History:  Social History   Socioeconomic History  . Marital status: Single    Spouse name: Not on file  . Number of children: 0  . Years of education: Not on file  . Highest education level: Master's degree (e.g., MA, MS, MEng, MEd, MSW, MBA)  Occupational History  . Occupation: Retired  Scientific laboratory technician  . Financial resource strain: Not hard at all  . Food insecurity:    Worry: Never true    Inability: Never true  . Transportation needs:    Medical: No    Non-medical: No  Tobacco Use  . Smoking status: Never Smoker  .  Smokeless tobacco: Never Used  . Tobacco comment: smoking cessation materials not required  Substance and Sexual Activity  . Alcohol use: No    Alcohol/week: 0.0 standard drinks  . Drug use: No  . Sexual activity: Not Currently    Birth control/protection: Spermicide  Lifestyle  . Physical activity:    Days per week: 5 days    Minutes per session: 60 min  . Stress: Not at all  Relationships  . Social connections:    Talks on phone: Patient refused    Gets together: Patient refused    Attends religious service: Patient refused    Active member of club or organization: Patient refused    Attends meetings of clubs or organizations: Patient refused    Relationship status: Patient refused  Other Topics Concern  . Not on file  Social History Narrative  . Not on file    Allergies: No Known Allergies  Metabolic Disorder Labs: No results found for: HGBA1C, MPG No results found for: PROLACTIN Lab Results  Component Value Date   CHOL 246 (H) 12/15/2017   TRIG 163 (H) 12/15/2017   HDL 69 12/15/2017   CHOLHDL 3.6 12/15/2017   LDLCALC 144 (H) 12/15/2017   LDLCALC 163 (H) 03/12/2016   Lab Results  Component Value Date   TSH 1.940 12/15/2017   TSH 2.330 03/12/2016    Therapeutic Level Labs: No results found for: LITHIUM No results found for: VALPROATE No components found for:  CBMZ  Current Medications: Current Outpatient Medications  Medication Sig Dispense Refill  . albuterol (PROVENTIL HFA;VENTOLIN HFA) 108 (90 Base) MCG/ACT inhaler Inhale 2 puffs into the lungs every 6 (six) hours as needed for wheezing or shortness of breath. 1 Inhaler 2  . budesonide-formoterol (SYMBICORT) 80-4.5 MCG/ACT inhaler Inhale 2 puffs into the lungs 2 (two) times daily. 1 Inhaler 0  . budesonide-formoterol (SYMBICORT) 80-4.5 MCG/ACT inhaler Inhale 2 puffs into the lungs 2 (two) times daily. 1 Inhaler 6  . [START ON 11/23/2018] buPROPion (WELLBUTRIN XL) 150 MG 24 hr tablet Take 1 tablet (150 mg  total) by mouth daily. 90 tablet 0  . cholecalciferol (VITAMIN D) 1000 units tablet Take 1,000 Units by mouth daily.    . Fluticasone-Salmeterol (ADVAIR DISKUS) 100-50 MCG/DOSE AEPB Inhale 1 puff into the lungs 2 (two) times daily. 60 each 1  . tretinoin (RETIN-A) 0.05 % cream RETIN-A, 0.05% (External Cream) - Historical Medication  (0.05 %) Active    . [START ON 11/05/2018] venlafaxine XR (EFFEXOR XR) 150 MG 24 hr capsule Take 1 capsule (150 mg total) by mouth daily. 90 capsule 0  . [START ON 11/05/2018] venlafaxine XR (EFFEXOR-XR) 75 MG 24 hr capsule Total of 225 mg (150 mg + 75 mg) daily 90 capsule 0   No current facility-administered medications for this visit.  Musculoskeletal: Strength & Muscle Tone: N/A Gait & Station: N/A Patient leans: N/A  Psychiatric Specialty Exam: Review of Systems  Psychiatric/Behavioral: Negative for depression, hallucinations, memory loss, substance abuse and suicidal ideas. The patient is not nervous/anxious and does not have insomnia.   All other systems reviewed and are negative.   There were no vitals taken for this visit.There is no height or weight on file to calculate BMI.  General Appearance: Fairly Groomed  Eye Contact:  Good  Speech:  Clear and Coherent  Volume:  Normal  Mood:  "good"  Affect:  Appropriate, Congruent and euthymic, reactive  Thought Process:  Coherent  Orientation:  Full (Time, Place, and Person)  Thought Content: Logical   Suicidal Thoughts:  No  Homicidal Thoughts:  No  Memory:  Immediate;   Good  Judgement:  Good  Insight:  Good  Psychomotor Activity:  Normal  Concentration:  Concentration: Good and Attention Span: Good  Recall:  Good  Fund of Knowledge: Good  Language: Good  Akathisia:  No  Handed:  Right  AIMS (if indicated): not done  Assets:  Communication Skills Desire for Improvement  ADL's:  Intact  Cognition: WNL  Sleep:  Good   Screenings: PHQ2-9     Clinical Support from 01/04/2018 in [facility] Office Visit from 12/15/2017 in [facility] Office Visit from 02/19/2017 in [facility] Office Visit from 02/05/2016 in [facility] Office Visit from 01/08/2016 in [facility]  PHQ-2 Total Score  0  0  6  2  3   PHQ-9 Total Score  0  0  15  2  13        Assessment and Plan:  Zenita Marilynn Ekstein is a 68 y.o. year old female with a history of depression, arthritis,  , who presents for follow up appointment for Recurrent major depressive disorder, in full remission ([place]) - Plan: venlafaxine XR (EFFEXOR-XR) 75 MG 24 hr capsule, venlafaxine XR (EFFEXOR XR) 150 MG 24 hr capsule  # MDD in remission She denies significant mood symptoms since her last visit.  She also wants to stay on the current medication regimen to avoid relapse.  Will continue bupropion as adjunctive treatment for depression.  She has no known history of seizure.  Will continue venlafaxine to target depression.  Discussed behavioral activation.    Plan I have reviewed and updated plans as below 1. Continue Effexor 225 mg daily 2. Continue bupropion 150 mg daily (used to be on Wellbutrin IR 75 mg twice a day)  3. Next appointment: 9/21 at 11 AM for 20 mins, video  The patient demonstrates the following risk factors for suicide: Chronic risk factors for suicide include:psychiatric disorder ofdepression. Acute risk factorsfor suicide include: unemployment and social withdrawal/isolation. Protective factorsfor this patient include: coping skills and hope for the future. Considering these factors, the overall suicide risk at this point appears to below. Patientisappropriate for outpatient follow up.  The duration of this appointment visit was 15 minutes of non face-to-face time with the patient.  Greater than 50% of this time was spent in counseling, explanation of  diagnosis, planning of further management, and coordination of care.  Norman Clay, MD 10/21/2018, 11:01 AM

## 2018-10-21 ENCOUNTER — Telehealth (HOSPITAL_COMMUNITY): Payer: Self-pay | Admitting: Psychiatry

## 2018-10-21 ENCOUNTER — Other Ambulatory Visit: Payer: Self-pay

## 2018-10-21 ENCOUNTER — Encounter (HOSPITAL_COMMUNITY): Payer: Self-pay | Admitting: Psychiatry

## 2018-10-21 ENCOUNTER — Ambulatory Visit (INDEPENDENT_AMBULATORY_CARE_PROVIDER_SITE_OTHER): Payer: PPO | Admitting: Psychiatry

## 2018-10-21 DIAGNOSIS — F3342 Major depressive disorder, recurrent, in full remission: Secondary | ICD-10-CM | POA: Diagnosis not present

## 2018-10-21 MED ORDER — VENLAFAXINE HCL ER 150 MG PO CP24: 150.0000 mg | ORAL_CAPSULE | Freq: Every day | ORAL | 0 refills | Status: DC

## 2018-10-21 MED ORDER — VENLAFAXINE HCL ER 75 MG PO CP24: ORAL_CAPSULE | ORAL | 0 refills | Status: DC

## 2018-10-21 MED ORDER — BUPROPION HCL ER (XL) 150 MG PO TB24: 150.0000 mg | ORAL_TABLET | Freq: Every day | ORAL | 0 refills | Status: DC

## 2018-10-21 NOTE — Patient Instructions (Signed)
1. Continue Effexor 225 mg daily 2. Change bupropion 150 mg daily 3. Next appointment: 9/21 at 11 AM

## 2018-12-16 ENCOUNTER — Ambulatory Visit (INDEPENDENT_AMBULATORY_CARE_PROVIDER_SITE_OTHER): Payer: PPO | Admitting: Internal Medicine

## 2018-12-16 ENCOUNTER — Encounter: Payer: Self-pay | Admitting: Internal Medicine

## 2018-12-16 ENCOUNTER — Encounter: Payer: PPO | Admitting: Internal Medicine

## 2018-12-16 ENCOUNTER — Other Ambulatory Visit: Payer: Self-pay

## 2018-12-16 VITALS — BP 108/70 | HR 76 | Ht 64.0 in | Wt 133.6 lb

## 2018-12-16 DIAGNOSIS — R0602 Shortness of breath: Secondary | ICD-10-CM

## 2018-12-16 DIAGNOSIS — M8580 Other specified disorders of bone density and structure, unspecified site: Secondary | ICD-10-CM

## 2018-12-16 DIAGNOSIS — Z0000 Encounter for general adult medical examination without abnormal findings: Secondary | ICD-10-CM | POA: Diagnosis not present

## 2018-12-16 DIAGNOSIS — F3342 Major depressive disorder, recurrent, in full remission: Secondary | ICD-10-CM | POA: Diagnosis not present

## 2018-12-16 DIAGNOSIS — Z1239 Encounter for other screening for malignant neoplasm of breast: Secondary | ICD-10-CM

## 2018-12-16 DIAGNOSIS — Z1211 Encounter for screening for malignant neoplasm of colon: Secondary | ICD-10-CM

## 2018-12-16 DIAGNOSIS — E781 Pure hyperglyceridemia: Secondary | ICD-10-CM

## 2018-12-16 LAB — POCT URINALYSIS DIPSTICK
Bilirubin, UA: NEGATIVE
Blood, UA: NEGATIVE
Glucose, UA: NEGATIVE
Ketones, UA: NEGATIVE
Leukocytes, UA: NEGATIVE
Nitrite, UA: NEGATIVE
Protein, UA: NEGATIVE
Spec Grav, UA: 1.01 (ref 1.010–1.025)
Urobilinogen, UA: 0.2 E.U./dL
pH, UA: 6 (ref 5.0–8.0)

## 2018-12-16 NOTE — Progress Notes (Signed)
Date:  12/16/2018   Name:  Samantha Barton   DOB:  03/20/1951   MRN:  [id number]   Chief Complaint: Annual Exam Samantha Barton is a 68 y.o. female who presents today for her Complete Annual Exam. She feels fairly well. She reports exercising regularly - bought a horse. She reports she is sleeping well.   Mammogram  12/2017 DEXA  02/2016 Colonoscopy - Cologuard 2017 Vaccinations up to date  Depression        This is a chronic (followed by Psych) problem.  The problem has been resolved since onset.  Associated symptoms include no fatigue and no headaches.  Past treatments include SSRIs - Selective serotonin reuptake inhibitors and other medications.  Compliance with treatment is good. Shortness of Breath This is a recurrent problem. Episode onset: evaluated last year by Pulmonology with normal testing.  Started on Symbicort and albuterol but no using these due to lack of benefit. The problem has been gradually improving (seems to be improving with regular exercise). Pertinent negatives include no abdominal pain, chest pain, fever, headaches, leg swelling, rash, vomiting or wheezing.  Hyperlipidemia This is a chronic problem. Associated symptoms include shortness of breath. Pertinent negatives include no chest pain. Current antihyperlipidemic treatment includes diet change and exercise.  Osteopenia - seen on DEXA n 2017.  Taking calcium and vitamin D.  She has had no fractures or joint pains.  Lab Results  Component Value Date   CREATININE 0.92 12/15/2017   BUN 20 12/15/2017   NA 141 12/15/2017   K 4.8 12/15/2017   CL 99 12/15/2017   CO2 23 12/15/2017   Lab Results  Component Value Date   CHOL 246 (H) 12/15/2017   HDL 69 12/15/2017   LDLCALC 144 (H) 12/15/2017   TRIG 163 (H) 12/15/2017   CHOLHDL 3.6 12/15/2017     Review of Systems  Constitutional: Negative for chills, fatigue and fever.  HENT: Negative for congestion, hearing loss, tinnitus, trouble swallowing and voice  change.   Eyes: Negative for visual disturbance.  Respiratory: Positive for shortness of breath. Negative for cough, chest tightness and wheezing.   Cardiovascular: Negative for chest pain, palpitations and leg swelling.  Gastrointestinal: Negative for abdominal pain, constipation, diarrhea and vomiting.  Endocrine: Negative for polydipsia and polyuria.  Genitourinary: Negative for dysuria, frequency, genital sores, vaginal bleeding and vaginal discharge.  Musculoskeletal: Negative for arthralgias, gait problem and joint swelling.  Skin: Negative for color change and rash.  Neurological: Negative for dizziness, tremors, light-headedness and headaches.  Hematological: Negative for adenopathy. Does not bruise/bleed easily.  Psychiatric/Behavioral: Positive for depression. Negative for dysphoric mood and sleep disturbance. The patient is not nervous/anxious.     Patient Active Problem List   Diagnosis Date Noted  . Shortness of breath 12/16/2018  . Recurrent major depressive disorder, in full remission ([place]) 06/24/2018  . Osteopenia determined by x-ray 03/19/2016  . Hypertriglyceridemia 01/14/2015    No Known Allergies  Past Surgical History:  Procedure Laterality Date  . BREAST BIOPSY Right 1998   bx/clip-neg  . ORBITAL FRACTURE SURGERY  04/25/2015  . ORIF ORBITAL FRACTURE Right 04/25/2015   Procedure: OPEN REDUCTION INTERNAL FIXATION (ORIF) ORBITAL FRACTURE;  Surgeon: Clyde Canterbury, MD;  Location: [facility] ORS;  Service: ENT;  Laterality: Right;  . SIGMOIDOSCOPY      Social History   Tobacco Use  . Smoking status: Never Smoker  . Smokeless tobacco: Never Used  . Tobacco comment: smoking cessation materials not required  Substance  Use Topics  . Alcohol use: No    Alcohol/week: 0.0 standard drinks  . Drug use: No     Medication list has been reviewed and updated.  Current Meds  Medication Sig  . buPROPion (WELLBUTRIN XL) 150 MG 24 hr tablet Take 1 tablet (150 mg total) by  mouth daily.  . cholecalciferol (VITAMIN D) 1000 units tablet Take 1,000 Units by mouth daily.  Marland Kitchen tretinoin (RETIN-A) 0.05 % cream RETIN-A, 0.05% (External Cream) - Historical Medication  (0.05 %) Active  . venlafaxine XR (EFFEXOR XR) 150 MG 24 hr capsule Take 1 capsule (150 mg total) by mouth daily.  Marland Kitchen venlafaxine XR (EFFEXOR-XR) 75 MG 24 hr capsule Total of 225 mg (150 mg + 75 mg) daily    PHQ 2/9 Scores 12/16/2018 01/04/2018 12/15/2017 02/19/2017  PHQ - 2 Score 0 0 0 6  PHQ- 9 Score 0 0 0 15    BP Readings from Last 3 Encounters:  12/16/18 108/70  05/05/18 102/68  03/25/18 122/82    Physical Exam Vitals signs and nursing note reviewed.  Constitutional:      General: She is not in acute distress.    Appearance: Normal appearance. She is well-developed.  HENT:     Head: Normocephalic and atraumatic.     Right Ear: Tympanic membrane and ear canal normal.     Left Ear: Tympanic membrane and ear canal normal.     Nose:     Right Sinus: No maxillary sinus tenderness.     Left Sinus: No maxillary sinus tenderness.     Mouth/Throat:     Pharynx: Uvula midline.  Eyes:     General: No scleral icterus.       Right eye: No discharge.        Left eye: No discharge.     Conjunctiva/sclera: Conjunctivae normal.  Neck:     Musculoskeletal: Normal range of motion. No erythema.     Thyroid: No thyromegaly.     Vascular: No carotid bruit.  Cardiovascular:     Rate and Rhythm: Normal rate and regular rhythm.     Pulses: Normal pulses.     Heart sounds: Normal heart sounds.  Pulmonary:     Effort: Pulmonary effort is normal. No respiratory distress.     Breath sounds: No wheezing.  Chest:     Breasts:        Right: No mass, nipple discharge, skin change or tenderness.        Left: No mass, nipple discharge, skin change or tenderness.  Abdominal:     General: Bowel sounds are normal.     Palpations: Abdomen is soft.     Tenderness: There is no abdominal tenderness.   Musculoskeletal: Normal range of motion.        General: No swelling.     Right lower leg: No edema.     Left lower leg: No edema.  Lymphadenopathy:     Cervical: No cervical adenopathy.  Skin:    General: Skin is warm and dry.     Capillary Refill: Capillary refill takes less than 2 seconds.     Findings: No rash.  Neurological:     General: No focal deficit present.     Mental Status: She is alert and oriented to person, place, and time.     Cranial Nerves: No cranial nerve deficit.     Sensory: No sensory deficit.     Deep Tendon Reflexes: Reflexes are normal and symmetric.  Psychiatric:  Speech: Speech normal.        Behavior: Behavior normal.        Thought Content: Thought content normal.     Wt Readings from Last 3 Encounters:  12/16/18 133 lb 9.6 oz (60.6 kg)  05/05/18 140 lb (63.5 kg)  01/04/18 141 lb (64 kg)    BP 108/70   Pulse 76   Ht 5\' 4"  (1.626 m)   Wt 133 lb 9.6 oz (60.6 kg)   SpO2 98%   BMI 22.93 kg/m   Assessment and Plan: 1. Annual physical exam Normal exam Continue exercise, healthy diet - POCT urinalysis dipstick  2. Breast cancer screening To be done at [facility]; Future  3. Osteopenia determined by x-ray [facility] be done at [facility] - DG Bone Density; Future  4. Colon cancer screening Pt declines colonoscopy - Cologuard  5. Shortness of breath Stable or slightly improved with negative pulmonary work up - CBC with Differential/Platelet - Comprehensive metabolic panel  6. Hypertriglyceridemia - Lipid panel  7. Recurrent major depressive disorder, in full remission ([place]) Followed by Psych - TSH   Partially dictated using Editor, commissioning. Any errors are unintentional.  Halina Maidens, MD [facility]  12/16/2018

## 2018-12-16 NOTE — Patient Instructions (Signed)
Health Maintenance After Age 68 After age 68, you are at a higher risk for certain long-term diseases and infections as well as injuries from falls. Falls are a major cause of broken bones and head injuries in people who are older than age 68. Getting regular preventive care can help to keep you healthy and well. Preventive care includes getting regular testing and making lifestyle changes as recommended by your health care provider. Talk with your health care provider about:  Which screenings and tests you should have. A screening is a test that checks for a disease when you have no symptoms.  A diet and exercise plan that is right for you. What should I know about screenings and tests to prevent falls? Screening and testing are the best ways to find a health problem early. Early diagnosis and treatment give you the best chance of managing medical conditions that are common after age 68. Certain conditions and lifestyle choices may make you more likely to have a fall. Your health care provider may recommend:  Regular vision checks. Poor vision and conditions such as cataracts can make you more likely to have a fall. If you wear glasses, make sure to get your prescription updated if your vision changes.  Medicine review. Work with your health care provider to regularly review all of the medicines you are taking, including over-the-counter medicines. Ask your health care provider about any side effects that may make you more likely to have a fall. Tell your health care provider if any medicines that you take make you feel dizzy or sleepy.  Osteoporosis screening. Osteoporosis is a condition that causes the bones to get weaker. This can make the bones weak and cause them to break more easily.  Blood pressure screening. Blood pressure changes and medicines to control blood pressure can make you feel dizzy.  Strength and balance checks. Your health care provider may recommend certain tests to check your  strength and balance while standing, walking, or changing positions.  Foot health exam. Foot pain and numbness, as well as not wearing proper footwear, can make you more likely to have a fall.  Depression screening. You may be more likely to have a fall if you have a fear of falling, feel emotionally low, or feel unable to do activities that you used to do.  Alcohol use screening. Using too much alcohol can affect your balance and may make you more likely to have a fall. What actions can I take to lower my risk of falls? General instructions  Talk with your health care provider about your risks for falling. Tell your health care provider if: ? You fall. Be sure to tell your health care provider about all falls, even ones that seem minor. ? You feel dizzy, sleepy, or off-balance.  Take over-the-counter and prescription medicines only as told by your health care provider. These include any supplements.  Eat a healthy diet and maintain a healthy weight. A healthy diet includes low-fat dairy products, low-fat (lean) meats, and fiber from whole grains, beans, and lots of fruits and vegetables. Home safety  Remove any tripping hazards, such as rugs, cords, and clutter.  Install safety equipment such as grab bars in bathrooms and safety rails on stairs.  Keep rooms and walkways well-lit. Activity   Follow a regular exercise program to stay fit. This will help you maintain your balance. Ask your health care provider what types of exercise are appropriate for you.  If you need a cane or   walker, use it as recommended by your health care provider.  Wear supportive shoes that have nonskid soles. Lifestyle  Do not drink alcohol if your health care provider tells you not to drink.  If you drink alcohol, limit how much you have: ? 0-1 drink a day for women. ? 0-2 drinks a day for men.  Be aware of how much alcohol is in your drink. In the [place] one drink equals one typical bottle of beer (12  oz), one-half glass of wine (5 oz), or one shot of hard liquor (1 oz).  Do not use any products that contain nicotine or tobacco, such as cigarettes and e-cigarettes. If you need help quitting, ask your health care provider. Summary  Having a healthy lifestyle and getting preventive care can help to protect your health and wellness after age 68.  Screening and testing are the best way to find a health problem early and help you avoid having a fall. Early diagnosis and treatment give you the best chance for managing medical conditions that are more common for people who are older than age 68.  Falls are a major cause of broken bones and head injuries in people who are older than age 68. Take precautions to prevent a fall at home.  Work with your health care provider to learn what changes you can make to improve your health and wellness and to prevent falls. This information is not intended to replace advice given to you by your health care provider. Make sure you discuss any questions you have with your health care provider. Document Released: 03/25/2017 Document Revised: 09/02/2018 Document Reviewed: 03/25/2017 Elsevier Patient Education  2020 Elsevier Inc.  

## 2018-12-17 LAB — CBC WITH DIFFERENTIAL/PLATELET
Basophils Absolute: 0.1 10*3/uL (ref 0.0–0.2)
Basos: 1 %
EOS (ABSOLUTE): 0.1 10*3/uL (ref 0.0–0.4)
Eos: 2 %
Hematocrit: 38.1 % (ref 34.0–46.6)
Hemoglobin: 13.3 g/dL (ref 11.1–15.9)
Immature Grans (Abs): 0 10*3/uL (ref 0.0–0.1)
Immature Granulocytes: 0 %
Lymphocytes Absolute: 2 10*3/uL (ref 0.7–3.1)
Lymphs: 35 %
MCH: 33.9 pg — ABNORMAL HIGH (ref 26.6–33.0)
MCHC: 34.9 g/dL (ref 31.5–35.7)
MCV: 97 fL (ref 79–97)
Monocytes Absolute: 0.5 10*3/uL (ref 0.1–0.9)
Monocytes: 8 %
Neutrophils Absolute: 3.2 10*3/uL (ref 1.4–7.0)
Neutrophils: 54 %
Platelets: 328 10*3/uL (ref 150–450)
RBC: 3.92 x10E6/uL (ref 3.77–5.28)
RDW: 12 % (ref 11.7–15.4)
WBC: 5.9 10*3/uL (ref 3.4–10.8)

## 2018-12-17 LAB — COMPREHENSIVE METABOLIC PANEL
ALT: 14 IU/L (ref 0–32)
AST: 18 IU/L (ref 0–40)
Albumin/Globulin Ratio: 2 (ref 1.2–2.2)
Albumin: 4.5 g/dL (ref 3.8–4.8)
Alkaline Phosphatase: 83 IU/L (ref 39–117)
BUN/Creatinine Ratio: 26 (ref 12–28)
BUN: 25 mg/dL (ref 8–27)
Bilirubin Total: 0.3 mg/dL (ref 0.0–1.2)
CO2: 25 mmol/L (ref 20–29)
Calcium: 10 mg/dL (ref 8.7–10.3)
Chloride: 100 mmol/L (ref 96–106)
Creatinine, Ser: 0.95 mg/dL (ref 0.57–1.00)
GFR calc Af Amer: 72 mL/min/{1.73_m2} (ref >59)
GFR calc non Af Amer: 62 mL/min/{1.73_m2} (ref >59)
Globulin, Total: 2.3 g/dL (ref 1.5–4.5)
Glucose: 93 mg/dL (ref 65–99)
Potassium: 4.2 mmol/L (ref 3.5–5.2)
Sodium: 140 mmol/L (ref 134–144)
Total Protein: 6.8 g/dL (ref 6.0–8.5)

## 2018-12-17 LAB — LIPID PANEL
Chol/HDL Ratio: 3.5 ratio (ref 0.0–4.4)
Cholesterol, Total: 260 mg/dL — ABNORMAL HIGH (ref 100–199)
HDL: 74 mg/dL (ref >39)
LDL Calculated: 156 mg/dL — ABNORMAL HIGH (ref 0–99)
Triglycerides: 151 mg/dL — ABNORMAL HIGH (ref 0–149)
VLDL Cholesterol Cal: 30 mg/dL (ref 5–40)

## 2018-12-17 LAB — TSH: TSH: 1.6 u[IU]/mL (ref 0.450–4.500)

## 2018-12-23 ENCOUNTER — Telehealth: Payer: Self-pay | Admitting: *Deleted

## 2018-12-23 ENCOUNTER — Encounter: Payer: Self-pay | Admitting: Podiatry

## 2018-12-23 ENCOUNTER — Other Ambulatory Visit: Payer: Self-pay

## 2018-12-23 ENCOUNTER — Ambulatory Visit: Payer: PPO | Admitting: Podiatry

## 2018-12-23 VITALS — BP 149/88 | HR 96 | Temp 95.9°F | Resp 16

## 2018-12-23 DIAGNOSIS — L600 Ingrowing nail: Secondary | ICD-10-CM

## 2018-12-23 DIAGNOSIS — B351 Tinea unguium: Secondary | ICD-10-CM

## 2018-12-23 DIAGNOSIS — M79674 Pain in right toe(s): Secondary | ICD-10-CM

## 2018-12-23 DIAGNOSIS — M79675 Pain in left toe(s): Secondary | ICD-10-CM

## 2018-12-23 MED ORDER — NEOMYCIN-POLYMYXIN-HC 1 % OT SOLN: 3.0000 [drp] | Freq: Two times a day (BID) | OTIC | 0 refills | Status: DC

## 2018-12-23 NOTE — Patient Instructions (Signed)

## 2018-12-23 NOTE — Progress Notes (Signed)
Subjective:   Patient ID: Samantha Barton, female   DOB: 68 y.o.   MRN: [id number]   HPI Patient presents stating having trouble with my big toenail on my right and all my nails are thick and I cannot cut them myself and they can become discomforting.  Patient has tried to trim the corner of the nail out herself and soak them does not smoke likes to be active   Review of Systems  All other systems reviewed and are negative.       Objective:  Physical Exam Vitals signs and nursing note reviewed.  Constitutional:      Appearance: She is well-developed.  Pulmonary:     Effort: Pulmonary effort is normal.  Musculoskeletal: Normal range of motion.  Skin:    General: Skin is warm.  Neurological:     Mental Status: She is alert.     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  We will get a focus on the big toe right and I explained permanent procedure allowing her to read consent form after review.  I infiltrated the right hallux 60 mg like Marcaine mixture sterile prep applied and using sterile instrumentation I remove the lateral border exposed matrix and applied phenol 3 applications 30 seconds followed by alcohol by sterile dressing.  Gave instructions for soaks and to leave dressing on 24 hours but take it off earlier if needed.  I then debrided nailbeds 1 through 5 both feet with no iatrogenic bleeding and this will be done as needed

## 2018-12-23 NOTE — Telephone Encounter (Signed)
[facility] - Rod asked if the drops were to be put in the ears as well as on the toes. I told Rod only the toes.

## 2018-12-23 NOTE — Progress Notes (Signed)
   Subjective:    Patient ID: Samantha Barton, female    DOB: [birth year], 68 y.o.   MRN: [id number]  HPI    Review of Systems  All other systems reviewed and are negative.      Objective:   Physical Exam        Assessment & Plan:

## 2019-01-27 ENCOUNTER — Other Ambulatory Visit: Payer: Self-pay | Admitting: Internal Medicine

## 2019-01-27 DIAGNOSIS — F3342 Major depressive disorder, recurrent, in full remission: Secondary | ICD-10-CM

## 2019-01-27 MED FILL — VENLAFAXINE HCL ER 75 MG CA: 75 | 90 days supply | Qty: 90 | Fill #0 | Status: TO

## 2019-02-07 DIAGNOSIS — Z23 Encounter for immunization: Secondary | ICD-10-CM | POA: Diagnosis not present

## 2019-02-08 ENCOUNTER — Encounter: Payer: Self-pay | Admitting: Internal Medicine

## 2019-02-08 NOTE — Telephone Encounter (Signed)
Please advise. Patient wants her psych meds from you?

## 2019-02-10 DIAGNOSIS — L7211 Pilar cyst: Secondary | ICD-10-CM | POA: Diagnosis not present
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
## 2019-02-14 ENCOUNTER — Ambulatory Visit (HOSPITAL_COMMUNITY): Payer: PPO | Admitting: Psychiatry

## 2019-03-01 ENCOUNTER — Ambulatory Visit
Admission: RE | Admit: 2019-03-01 | Discharge: 2019-03-01 | Disposition: A | Discharge status: Home / Self Care | Payer: PPO | Source: Ambulatory Visit | Attending: Internal Medicine | Admitting: Internal Medicine

## 2019-03-01 ENCOUNTER — Other Ambulatory Visit: Payer: Self-pay

## 2019-03-01 DIAGNOSIS — M8580 Other specified disorders of bone density and structure, unspecified site: Secondary | ICD-10-CM

## 2019-03-01 DIAGNOSIS — M8589 Other specified disorders of bone density and structure, multiple sites: Secondary | ICD-10-CM | POA: Diagnosis not present

## 2019-03-01 DIAGNOSIS — Z780 Asymptomatic menopausal state: Secondary | ICD-10-CM | POA: Diagnosis not present

## 2019-03-01 DIAGNOSIS — Z1239 Encounter for other screening for malignant neoplasm of breast: Secondary | ICD-10-CM

## 2019-03-01 DIAGNOSIS — Z1231 Encounter for screening mammogram for malignant neoplasm of breast: Secondary | ICD-10-CM | POA: Diagnosis not present

## 2019-03-21 ENCOUNTER — Other Ambulatory Visit: Payer: Self-pay

## 2019-03-21 ENCOUNTER — Other Ambulatory Visit: Payer: Self-pay | Admitting: Internal Medicine

## 2019-03-21 DIAGNOSIS — F3342 Major depressive disorder, recurrent, in full remission: Secondary | ICD-10-CM

## 2019-03-21 MED ORDER — BUPROPION HCL ER (XL) 150 MG PO TB24: 150.0000 mg | ORAL_TABLET | Freq: Every day | ORAL | 0 refills | Status: DC

## 2019-03-21 MED FILL — buPROPion HCL ER (XL) 150 M: 150 | 90 days supply | Qty: 90 | Fill #0

## 2019-03-22 ENCOUNTER — Other Ambulatory Visit: Payer: Self-pay

## 2019-03-22 MED FILL — VENLAFAXINE HCL ER 150 MG C: 150 | 90 days supply | Qty: 90 | Fill #0

## 2019-03-28 ENCOUNTER — Ambulatory Visit (INDEPENDENT_AMBULATORY_CARE_PROVIDER_SITE_OTHER): Payer: PPO

## 2019-03-28 DIAGNOSIS — Z0000 Encounter for general adult medical examination without abnormal findings: Secondary | ICD-10-CM

## 2019-03-28 NOTE — Patient Instructions (Signed)
[name] , Thank you for taking time to come for your Medicare Wellness Visit. I appreciate your ongoing commitment to your health goals. Please review the following plan we discussed and let me know if I can assist you in the future.   Screening recommendations/referrals: Colonoscopy: Cologuard completed 04/07/16. Please complete new kit once it arrives.  Mammogram: done 03/01/19 Bone Density: done 03/01/19 Recommended yearly ophthalmology/optometry visit for glaucoma screening and checkup Recommended yearly dental visit for hygiene and checkup  Vaccinations: Influenza vaccine: done 02/07/19 Pneumococcal vaccine: done 12/15/17 Tdap vaccine: done 2015 Shingles vaccine: Shingrix discussed. Please contact your pharmacy for coverage information.   Advanced directives: Please bring a copy of your health care power of attorney and living will to the office at your convenience.  Conditions/risks identified: Keep up the great work!  Next appointment: Please follow up in one year for your Medicare Annual Wellness visit.     Preventive Care 68 Years and Older, Female Preventive care refers to lifestyle choices and visits with your health care provider that can promote health and wellness. What does preventive care include?  A yearly physical exam. This is also called an annual well check.  Dental exams once or twice a year.  Routine eye exams. Ask your health care provider how often you should have your eyes checked.  Personal lifestyle choices, including:  Daily care of your teeth and gums.  Regular physical activity.  Eating a healthy diet.  Avoiding tobacco and drug use.  Limiting alcohol use.  Practicing safe sex.  Taking low-dose aspirin every day.  Taking vitamin and mineral supplements as recommended by your health care provider. What happens during an annual well check? The services and screenings done by your health care provider during your annual well check will  depend on your age, overall health, lifestyle risk factors, and family history of disease. Counseling  Your health care provider may ask you questions about your:  Alcohol use.  Tobacco use.  Drug use.  Emotional well-being.  Home and relationship well-being.  Sexual activity.  Eating habits.  History of falls.  Memory and ability to understand (cognition).  Work and work Statistician.  Reproductive health. Screening  You may have the following tests or measurements:  Height, weight, and BMI.  Blood pressure.  Lipid and cholesterol levels. These may be checked every 5 years, or more frequently if you are over 47 years old.  Skin check.  Lung cancer screening. You may have this screening every year starting at age 61 if you have a 30-pack-year history of smoking and currently smoke or have quit within the past 15 years.  Fecal occult blood test (FOBT) of the stool. You may have this test every year starting at age 61.  Flexible sigmoidoscopy or colonoscopy. You may have a sigmoidoscopy every 5 years or a colonoscopy every 10 years starting at age 2.  Hepatitis C blood test.  Hepatitis B blood test.  Sexually transmitted disease (STD) testing.  Diabetes screening. This is done by checking your blood sugar (glucose) after you have not eaten for a while (fasting). You may have this done every 1-3 years.  Bone density scan. This is done to screen for osteoporosis. You may have this done starting at age 15.  Mammogram. This may be done every 1-2 years. Talk to your health care provider about how often you should have regular mammograms. Talk with your health care provider about your test results, treatment options, and if necessary, the need  for more tests. Vaccines  Your health care provider may recommend certain vaccines, such as:  Influenza vaccine. This is recommended every year.  Tetanus, diphtheria, and acellular pertussis (Tdap, Td) vaccine. You may need a  Td booster every 10 years.  Zoster vaccine. You may need this after age 42.  Pneumococcal 13-valent conjugate (PCV13) vaccine. One dose is recommended after age 40.  Pneumococcal polysaccharide (PPSV23) vaccine. One dose is recommended after age 22. Talk to your health care provider about which screenings and vaccines you need and how often you need them. This information is not intended to replace advice given to you by your health care provider. Make sure you discuss any questions you have with your health care provider. Document Released: 06/08/2015 Document Revised: 01/30/2016 Document Reviewed: 03/13/2015 Elsevier Interactive Patient Education  2017 [place] Prevention in the Home Falls can cause injuries. They can happen to people of all ages. There are many things you can do to make your home safe and to help prevent falls. What can I do on the outside of my home?  Regularly fix the edges of walkways and driveways and fix any cracks.  Remove anything that might make you trip as you walk through a door, such as a raised step or threshold.  Trim any bushes or trees on the path to your home.  Use bright outdoor lighting.  Clear any walking paths of anything that might make someone trip, such as rocks or tools.  Regularly check to see if handrails are loose or broken. Make sure that both sides of any steps have handrails.  Any raised decks and porches should have guardrails on the edges.  Have any leaves, snow, or ice cleared regularly.  Use sand or salt on walking paths during winter.  Clean up any spills in your garage right away. This includes oil or grease spills. What can I do in the bathroom?  Use night lights.  Install grab bars by the toilet and in the tub and shower. Do not use towel bars as grab bars.  Use non-skid mats or decals in the tub or shower.  If you need to sit down in the shower, use a plastic, non-slip stool.  Keep the floor dry. Clean  up any water that spills on the floor as soon as it happens.  Remove soap buildup in the tub or shower regularly.  Attach bath mats securely with double-sided non-slip rug tape.  Do not have throw rugs and other things on the floor that can make you trip. What can I do in the bedroom?  Use night lights.  Make sure that you have a light by your bed that is easy to reach.  Do not use any sheets or blankets that are too big for your bed. They should not hang down onto the floor.  Have a firm chair that has side arms. You can use this for support while you get dressed.  Do not have throw rugs and other things on the floor that can make you trip. What can I do in the kitchen?  Clean up any spills right away.  Avoid walking on wet floors.  Keep items that you use a lot in easy-to-reach places.  If you need to reach something above you, use a strong step stool that has a grab bar.  Keep electrical cords out of the way.  Do not use floor polish or wax that makes floors slippery. If you must use wax, use  non-skid floor wax.  Do not have throw rugs and other things on the floor that can make you trip. What can I do with my stairs?  Do not leave any items on the stairs.  Make sure that there are handrails on both sides of the stairs and use them. Fix handrails that are broken or loose. Make sure that handrails are as long as the stairways.  Check any carpeting to make sure that it is firmly attached to the stairs. Fix any carpet that is loose or worn.  Avoid having throw rugs at the top or bottom of the stairs. If you do have throw rugs, attach them to the floor with carpet tape.  Make sure that you have a light switch at the top of the stairs and the bottom of the stairs. If you do not have them, ask someone to add them for you. What else can I do to help prevent falls?  Wear shoes that:  Do not have high heels.  Have rubber bottoms.  Are comfortable and fit you well.  Are  closed at the toe. Do not wear sandals.  If you use a stepladder:  Make sure that it is fully opened. Do not climb a closed stepladder.  Make sure that both sides of the stepladder are locked into place.  Ask someone to hold it for you, if possible.  Clearly mark and make sure that you can see:  Any grab bars or handrails.  First and last steps.  Where the edge of each step is.  Use tools that help you move around (mobility aids) if they are needed. These include:  Canes.  Walkers.  Scooters.  Crutches.  Turn on the lights when you go into a dark area. Replace any light bulbs as soon as they burn out.  Set up your furniture so you have a clear path. Avoid moving your furniture around.  If any of your floors are uneven, fix them.  If there are any pets around you, be aware of where they are.  Review your medicines with your doctor. Some medicines can make you feel dizzy. This can increase your chance of falling. Ask your doctor what other things that you can do to help prevent falls. This information is not intended to replace advice given to you by your health care provider. Make sure you discuss any questions you have with your health care provider. Document Released: 03/08/2009 Document Revised: 10/18/2015 Document Reviewed: 06/16/2014 Elsevier Interactive Patient Education  2017 [employer].

## 2019-03-28 NOTE — Progress Notes (Signed)
Subjective:   Samantha Barton is a 68 y.o. female who presents for Medicare Annual (Subsequent) preventive examination.  Virtual Visit via Telephone Note  I connected with Samantha Barton on 03/28/19 at  8:40 AM EST by telephone and verified that I am speaking with the correct person using two identifiers.  Medicare Annual Wellness visit completed telephonically due to Covid-19 pandemic.   Location: Patient: home Provider: office   I discussed the limitations, risks, security and privacy concerns of performing an evaluation and management service by telephone and the availability of in person appointments. The patient expressed understanding and agreed to proceed.  Some vital signs may be absent or patient reported.   Samantha Marker, LPN    Review of Systems:   Cardiac Risk Factors include: advanced age (>97mn, >>55women)     Objective:     Vitals: There were no vitals taken for this visit.  There is no height or weight on file to calculate BMI.  Advanced Directives 03/28/2019 01/04/2018 12/15/2017 04/25/2015 04/07/2015 02/02/2015  Does Patient Have a Medical Advance Directive? Yes Yes Yes No No No  Type of AParamedicof ALas NutriasLiving will HBradenLiving will Living will - - -  Does patient want to make changes to medical advance directive? - - No - Patient declined - - -  Copy of [place] AFBin Chart? No - copy requested No - copy requested - - - -  Would patient like information on creating a medical advance directive? - - - No - patient declined information No - patient declined information No - patient declined information  Some encounter information is confidential and restricted. Go to Review Flowsheets activity to see all data.    Tobacco Social History   Tobacco Use  Smoking Status Never Smoker  Smokeless Tobacco Never Used  Tobacco Comment   smoking cessation materials not required     Counseling  given: Not Answered Comment: smoking cessation materials not required   Clinical Intake:  Pre-visit preparation completed: Yes  Pain : No/denies pain     Nutritional Risks: None Diabetes: No  How often do you need to have someone help you when you read instructions, pamphlets, or other written materials from your doctor or pharmacy?: 1 - Never  Interpreter Needed?: No  Information entered by :: KClemetine MarkerLPN  Past Medical History:  Diagnosis Date  . Arthritis   . Depression   . Depression, major, recurrent, mild ([facility] 01/14/2015  . Fracture, orbital ([facility] 06/2015   Right Eye -Horse ACCIDENT  . Hand fracture, right 06/2015   Horse Accident    Past Surgical History:  Procedure Laterality Date  . BREAST BIOPSY Right 1998   bx/clip-neg  . ORBITAL FRACTURE SURGERY  04/25/2015  . ORIF ORBITAL FRACTURE Right 04/25/2015   Procedure: OPEN REDUCTION INTERNAL FIXATION (ORIF) ORBITAL FRACTURE;  Surgeon: PClyde Canterbury MD;  Location: [facility] ORS;  Service: ENT;  Laterality: Right;  . SIGMOIDOSCOPY     Family History  Problem Relation Age of Onset  . Diabetes Father   . Healthy Mother   . Breast cancer Neg Hx    Social History   Socioeconomic History  . Marital status: Single    Spouse name: Not on file  . Number of children: 0  . Years of education: Not on file  . Highest education level: Master's degree (e.g., MA, MS, MEng, MEd, MSW, MBA)  Occupational History  . Occupation: Retired  Social Needs  . Financial resource strain: Not hard at all  . Food insecurity    Worry: Never true    Inability: Never true  . Transportation needs    Medical: No    Non-medical: No  Tobacco Use  . Smoking status: Never Smoker  . Smokeless tobacco: Never Used  . Tobacco comment: smoking cessation materials not required  Substance and Sexual Activity  . Alcohol use: No    Alcohol/week: 0.0 standard drinks  . Drug use: No  . Sexual activity: Not Currently    Birth  control/protection: Spermicide  Lifestyle  . Physical activity    Days per week: 4 days    Minutes per session: 60 min  . Stress: Only a little  Relationships  . Social Herbalist on phone: Patient refused    Gets together: Patient refused    Attends religious service: Patient refused    Active member of club or organization: Patient refused    Attends meetings of clubs or organizations: Patient refused    Relationship status: Patient refused  Other Topics Concern  . Not on file  Social History Narrative  . Not on file    Outpatient Encounter Medications as of 03/28/2019  Medication Sig  . buPROPion (WELLBUTRIN XL) 150 MG 24 hr tablet Take 1 tablet (150 mg total) by mouth daily.  . Calcium Carbonate (CALCIUM 500 PO) Take by mouth.  . cholecalciferol (VITAMIN D) 1000 units tablet Take 1,000 Units by mouth daily.  Marland Kitchen tretinoin (RETIN-A) 0.05 % cream RETIN-A, 0.05% (External Cream) - Historical Medication  (0.05 %) Active  . venlafaxine XR (EFFEXOR-XR) 150 MG 24 hr capsule TAKE 1 CAPSULE BY MOUTH DAILY  . venlafaxine XR (EFFEXOR-XR) 75 MG 24 hr capsule TAKE 1 CAPSULE BY MOUTH DAILY WITH BREAKFAST.  . [DISCONTINUED] NEOMYCIN-POLYMYXIN-HYDROCORTISONE (CORTISPORIN) 1 % SOLN OTIC solution Place 3 drops into both ears 2 (two) times a day. Place 1 to 2 drops on the affected area of your nail   No facility-administered encounter medications on file as of 03/28/2019.     Activities of Daily Living In your present state of health, do you have any difficulty performing the following activities: 03/28/2019  Hearing? Y  Comment wears hearing aids  Vision? N  Difficulty concentrating or making decisions? N  Walking or climbing stairs? N  Dressing or bathing? N  Doing errands, shopping? N  Preparing Food and eating ? N  Using the Toilet? N  In the past six months, have you accidently leaked urine? N  Do you have problems with loss of bowel control? N  Managing your Medications? N   Managing your Finances? N  Housekeeping or managing your Housekeeping? N  Some recent data might be hidden    Patient Care Team: Glean Hess, MD as PCP - General (Internal Medicine) Norman Clay, MD as Consulting Physician (Psychiatry) Jannet Mantis, MD as Consulting Physician (Dermatology)    Assessment:   This is a routine wellness examination for Konica.  Exercise Activities and Dietary recommendations Current Exercise Habits: Home exercise routine, Time (Minutes): 60, Frequency (Times/Week): 4, Weekly Exercise (Minutes/Week): 240, Intensity: Moderate, Exercise limited by: Other - see comments(horseback riding)  Goals    . DIET - INCREASE WATER INTAKE     Recommend to drink at least 6-8 8oz glasses of water per day.       Fall Risk Fall Risk  03/28/2019 01/04/2018 02/19/2017 01/08/2016 02/02/2015  Falls in the past year? 0  No No No No  Number falls in past yr: 0 - - - -  Injury with Fall? 0 - - - -  Risk for fall due to : - Impaired vision - - -  Risk for fall due to: Comment - wears eyeglasses - - -  Follow up Falls prevention discussed - - - -   FALL RISK PREVENTION PERTAINING TO THE HOME:  Any stairs in or around the home? Yes  If so, do they handrails? Yes   Home free of loose throw rugs in walkways, pet beds, electrical cords, etc? Yes  Adequate lighting in your home to reduce risk of falls? Yes   ASSISTIVE DEVICES UTILIZED TO PREVENT FALLS:  Life alert? No  Use of a cane, walker or w/c? No  Grab bars in the bathroom? No  Shower chair or bench in shower? No  Elevated toilet seat or a handicapped toilet? No   DME ORDERS:  DME order needed?  No   TIMED UP AND GO:  Was the test performed? No . Telephonic visit.   Education: Fall risk prevention has been discussed.  Intervention(s) required? No   Depression Screen PHQ 2/9 Scores 03/28/2019 12/16/2018 01/04/2018 12/15/2017  PHQ - 2 Score 0 0 0 0  PHQ- 9 Score - 0 0 0     Cognitive Function -  6CIT deferred for 2020 AWV. Pt has no memory issues.      6CIT Screen 01/04/2018  What Year? 0 points  What month? 0 points  What time? 0 points  Count back from 20 0 points  Months in reverse 0 points  Repeat phrase 0 points  Total Score 0    Immunization History  Administered Date(s) Administered  . Influenza, High Dose Seasonal PF 03/17/2018, 02/07/2019  . Influenza,inj,Quad PF,6+ Mos 02/19/2017  . Influenza-Unspecified 02/08/2016, 02/07/2019  . Pneumococcal Conjugate-13 03/12/2016  . Pneumococcal Polysaccharide-23 05/27/2009, 12/15/2017  . Tdap 05/27/2013  . Zoster 05/27/2012    Qualifies for Shingles Vaccine? Yes  Zostavax completed 2014. Due for Shingrix. Education has been provided regarding the importance of this vaccine. Pt has been advised to call insurance company to determine out of pocket expense. Advised may also receive vaccine at local pharmacy or Health Dept. Verbalized acceptance and understanding.  Tdap: Up to date  Flu Vaccine: Up to date  Pneumococcal Vaccine: Up to date    Screening Tests Health Maintenance  Topic Date Due  . Fecal DNA (Cologuard)  04/08/2019  . MAMMOGRAM  02/29/2020  . TETANUS/TDAP  05/28/2023  . INFLUENZA VACCINE  Completed  . DEXA SCAN  Completed  . Hepatitis C Screening  Completed  . PNA vac Low Risk Adult  Completed    Cancer Screenings:  Colorectal Screening: Completed 04/07/16. Repeat every 3 years; Cologuard previously ordered, pt is awaiting new kit.   Mammogram: Completed 03/01/19. Repeat every year.   Bone Density: Completed 03/01/19. Results reflect  OSTEOPENIA. Repeat every 2 years.   Lung Cancer Screening: (Low Dose CT Chest recommended if Age 30-80 years, 30 pack-year currently smoking OR have quit w/in 15years.) does not qualify.    Additional Screening:  Hepatitis C Screening: does qualify; Completed 03/12/16  Vision Screening: Recommended annual ophthalmology exams for early detection of glaucoma and  other disorders of the eye. Is the patient up to date with their annual eye exam?  No  - postponed due to Covid Who is the provider or what is the name of the office in which the pt attends  annual eye exams? Eye provider in [place]: Recommended annual dental exams for proper oral hygiene  Community Resource Referral:  CRR required this visit?  No      Plan:     I have personally reviewed and addressed the Medicare Annual Wellness questionnaire and have noted the following in the patient's chart:  A. Medical and social history B. Use of alcohol, tobacco or illicit drugs  C. Current medications and supplements D. Functional ability and status E.  Nutritional status F.  Physical activity G. Advance directives H. List of other physicians I.  Hospitalizations, surgeries, and ER visits in previous 12 months J.  [place] such as hearing and vision if needed, cognitive and depression L. Referrals and appointments   In addition, I have reviewed and discussed with patient certain preventive protocols, quality metrics, and best practice recommendations. A written personalized care plan for preventive services as well as general preventive health recommendations were provided to patient.   Signed,  Samantha Marker, LPN Nurse Health Advisor   Nurse Notes: pt doing well and appreciative of visit today.

## 2019-04-26 DIAGNOSIS — Z1211 Encounter for screening for malignant neoplasm of colon: Secondary | ICD-10-CM | POA: Diagnosis not present

## 2019-04-26 LAB — COLOGUARD

## 2019-04-27 DIAGNOSIS — Z419 Encounter for procedure for purposes other than remedying health state, unspecified: Secondary | ICD-10-CM | POA: Diagnosis not present

## 2019-04-29 MED FILL — VENLAFAXINE HCL ER 75 MG CA: 75 | 90 days supply | Qty: 90 | Fill #1 | Status: TO

## 2019-05-23 ENCOUNTER — Other Ambulatory Visit: Payer: Self-pay | Admitting: Internal Medicine

## 2019-05-23 ENCOUNTER — Other Ambulatory Visit: Payer: Self-pay | Admitting: Pulmonary Disease

## 2019-05-23 DIAGNOSIS — R0602 Shortness of breath: Secondary | ICD-10-CM

## 2019-05-30 MED FILL — buPROPion HCL ER (XL) 150 M: 150 | 90 days supply | Qty: 90 | Fill #0

## 2019-06-07 ENCOUNTER — Telehealth: Payer: Self-pay | Admitting: Internal Medicine

## 2019-06-07 NOTE — Telephone Encounter (Signed)
Please let patient know that her Cologuard test was negative.  Will repeat in 3 yrs.

## 2019-06-07 NOTE — Telephone Encounter (Signed)
Patient informed on VM of normal cologuard. Told to call back with any further questions otherwise repeat in 3 yrs.

## 2019-06-13 ENCOUNTER — Encounter: Payer: Self-pay | Admitting: Internal Medicine

## 2019-06-13 ENCOUNTER — Ambulatory Visit
Admission: RE | Admit: 2019-06-13 | Discharge: 2019-06-13 | Disposition: A | Discharge status: Home / Self Care | Payer: PPO | Source: Ambulatory Visit | Attending: Internal Medicine | Admitting: Internal Medicine

## 2019-06-13 ENCOUNTER — Ambulatory Visit
Admission: RE | Admit: 2019-06-13 | Discharge: 2019-06-13 | Disposition: A | Discharge status: Home / Self Care | Payer: PPO | Attending: Internal Medicine | Admitting: Internal Medicine

## 2019-06-13 ENCOUNTER — Other Ambulatory Visit: Payer: Self-pay

## 2019-06-13 ENCOUNTER — Ambulatory Visit (INDEPENDENT_AMBULATORY_CARE_PROVIDER_SITE_OTHER): Payer: PPO | Admitting: Internal Medicine

## 2019-06-13 VITALS — BP 134/76 | HR 105 | Temp 98.3°F | Ht 64.0 in | Wt 133.0 lb

## 2019-06-13 DIAGNOSIS — G8929 Other chronic pain: Secondary | ICD-10-CM

## 2019-06-13 DIAGNOSIS — M1612 Unilateral primary osteoarthritis, left hip: Secondary | ICD-10-CM | POA: Diagnosis not present

## 2019-06-13 DIAGNOSIS — M25552 Pain in left hip: Secondary | ICD-10-CM

## 2019-06-13 DIAGNOSIS — F3342 Major depressive disorder, recurrent, in full remission: Secondary | ICD-10-CM | POA: Diagnosis not present

## 2019-06-13 DIAGNOSIS — Z1211 Encounter for screening for malignant neoplasm of colon: Secondary | ICD-10-CM | POA: Diagnosis not present

## 2019-06-13 NOTE — Progress Notes (Signed)
Date:  06/13/2019   Name:  Samantha Barton   DOB:  10-05-1950   MRN:  [id number]   Chief Complaint: Depression (Follow up on Effexor.) and Hip Pain (Left hip pain and loss of range of motion. Started 2 months ago. Takes motrin and does stretches but pain- 6)  Depression        This is a chronic problem.The problem is unchanged.  Associated symptoms include myalgias.  Associated symptoms include no fatigue.  Past treatments include other medications and SNRIs - Serotonin and norepinephrine reuptake inhibitors.  Compliance with treatment is good.  Previous treatment provided moderate relief. Hip Pain  There was no injury mechanism. The pain is present in the left hip. The quality of the pain is described as aching and burning. The pain is mild. The pain has been constant since onset. Associated symptoms include a loss of motion and muscle weakness. She reports no foreign bodies present. The symptoms are aggravated by movement. She has tried ice, heat and NSAIDs for the symptoms. The treatment provided no relief.    Lab Results  Component Value Date   CREATININE 0.95 12/16/2018   BUN 25 12/16/2018   NA 140 12/16/2018   K 4.2 12/16/2018   CL 100 12/16/2018   CO2 25 12/16/2018   Lab Results  Component Value Date   CHOL 260 (H) 12/16/2018   HDL 74 12/16/2018   LDLCALC 156 (H) 12/16/2018   TRIG 151 (H) 12/16/2018   CHOLHDL 3.5 12/16/2018   Lab Results  Component Value Date   TSH 1.600 12/16/2018   No results found for: HGBA1C   Review of Systems  Constitutional: Negative for chills, fatigue and fever.  Respiratory: Negative for cough, shortness of breath and wheezing.   Cardiovascular: Negative for chest pain, palpitations and leg swelling.  Musculoskeletal: Positive for arthralgias and myalgias. Negative for gait problem and joint swelling.  Psychiatric/Behavioral: Positive for depression.    Patient Active Problem List   Diagnosis Date Noted  . Shortness of breath  12/16/2018  . Recurrent major depressive disorder, in full remission ([place]) 06/24/2018  . Osteopenia determined by x-ray 03/19/2016  . Hypertriglyceridemia 01/14/2015    No Known Allergies  Past Surgical History:  Procedure Laterality Date  . BREAST BIOPSY Right 1998   bx/clip-neg  . ORBITAL FRACTURE SURGERY  04/25/2015  . ORIF ORBITAL FRACTURE Right 04/25/2015   Procedure: OPEN REDUCTION INTERNAL FIXATION (ORIF) ORBITAL FRACTURE;  Surgeon: Clyde Canterbury, MD;  Location: [facility] ORS;  Service: ENT;  Laterality: Right;  . SIGMOIDOSCOPY      Social History   Tobacco Use  . Smoking status: Never Smoker  . Smokeless tobacco: Never Used  . Tobacco comment: smoking cessation materials not required  Substance Use Topics  . Alcohol use: No    Alcohol/week: 0.0 standard drinks  . Drug use: No     Medication list has been reviewed and updated.  Current Meds  Medication Sig  . buPROPion (WELLBUTRIN XL) 150 MG 24 hr tablet TAKE 1 TABLET BY MOUTH DAILY.  . Calcium Carbonate (CALCIUM 500 PO) Take by mouth.  . cholecalciferol (VITAMIN D) 1000 units tablet Take 1,000 Units by mouth daily.  Marland Kitchen ibuprofen (ADVIL) 100 MG/5ML suspension Take 200 mg by mouth every 4 (four) hours as needed.  . tretinoin (RETIN-A) 0.05 % cream RETIN-A, 0.05% (External Cream) - Historical Medication  (0.05 %) Active  . venlafaxine XR (EFFEXOR-XR) 150 MG 24 hr capsule TAKE 1 CAPSULE BY MOUTH DAILY  .  venlafaxine XR (EFFEXOR-XR) 75 MG 24 hr capsule TAKE 1 CAPSULE BY MOUTH DAILY WITH BREAKFAST.    PHQ 2/9 Scores 06/13/2019 03/28/2019 12/16/2018 01/04/2018  PHQ - 2 Score 0 0 0 0  PHQ- 9 Score 3 - 0 0    BP Readings from Last 3 Encounters:  06/13/19 134/76  12/23/18 (!) 149/88  12/16/18 108/70    Physical Exam Vitals and nursing note reviewed.  Constitutional:      General: She is not in acute distress.    Appearance: She is well-developed.  HENT:     Head: Normocephalic and atraumatic.  Cardiovascular:      Rate and Rhythm: Normal rate and regular rhythm.  Pulmonary:     Effort: Pulmonary effort is normal. No respiratory distress.     Breath sounds: No wheezing or rhonchi.  Musculoskeletal:     Right hip: Normal.     Left hip: Tenderness present. No crepitus. Decreased range of motion.  Skin:    General: Skin is warm and dry.     Findings: No rash.  Neurological:     Mental Status: She is alert and oriented to person, place, and time.     Sensory: Sensation is intact.     Motor: Motor function is intact.     Gait: Gait is intact.  Psychiatric:        Behavior: Behavior normal.        Thought Content: Thought content normal.     Wt Readings from Last 3 Encounters:  06/13/19 133 lb (60.3 kg)  12/16/18 133 lb 9.6 oz (60.6 kg)  05/05/18 140 lb (63.5 kg)    BP 134/76   Pulse (!) 105   Temp 98.3 F (36.8 C) (Oral)   Ht 5\' 4"  (1.626 m)   Wt 133 lb (60.3 kg)   SpO2 99%   BMI 22.83 kg/m   Assessment and Plan: 1. Hip pain, chronic, left No injury and no benefit from nsaids and heat/ice Will get xray since she has significant decrease in ROM If normal xrays, will treat with steroid taper and refer if no improvement - DG Hip Unilat W OR W/O Pelvis 2-3 Views Left; Future  2. Recurrent major depressive disorder, in full remission ([place]) Clinically doing well on current medication regimen No side effects or SI/HI. Will continue Effexor and Bupropion  3. Colon cancer screening Recently completed Cologuard on 04/26/19   Partially dictated using [employer]. Any errors are unintentional.  Halina Maidens, MD [facility]  06/13/2019

## 2019-06-21 DIAGNOSIS — M1612 Unilateral primary osteoarthritis, left hip: Secondary | ICD-10-CM | POA: Diagnosis not present

## 2019-06-22 MED FILL — VENLAFAXINE HCL ER 150 MG C: 150 | 90 days supply | Qty: 90 | Fill #1

## 2019-06-28 DIAGNOSIS — M1612 Unilateral primary osteoarthritis, left hip: Secondary | ICD-10-CM | POA: Diagnosis not present

## 2019-06-28 DIAGNOSIS — M25652 Stiffness of left hip, not elsewhere classified: Secondary | ICD-10-CM | POA: Diagnosis not present

## 2019-06-28 DIAGNOSIS — M6281 Muscle weakness (generalized): Secondary | ICD-10-CM | POA: Diagnosis not present

## 2019-07-04 ENCOUNTER — Ambulatory Visit: Payer: PPO | Attending: Internal Medicine

## 2019-07-04 DIAGNOSIS — Z23 Encounter for immunization: Secondary | ICD-10-CM

## 2019-07-04 NOTE — Progress Notes (Signed)
   Covid-19 Vaccination Clinic  Name:  Floye Schnaidt    MRN: [id number] DOB: 1951/04/02  07/04/2019  Ms. Parenti was observed post Covid-19 immunization for 15 minutes without incidence. She was provided with Vaccine Information Sheet and instruction to access the V-Safe system.   Ms. Falconer was instructed to call 911 with any severe reactions post vaccine: Marland Kitchen Difficulty breathing  . Swelling of your face and throat  . A fast heartbeat  . A bad rash all over your body  . Dizziness and weakness    Immunizations Administered    Name Date Dose VIS Date Route   Pfizer COVID-19 Vaccine 07/04/2019  4:02 PM 0.3 mL 05/06/2019 Intramuscular   Manufacturer: [place]   Lot: [id number]   [place]: [id number]

## 2019-07-19 DIAGNOSIS — M1612 Unilateral primary osteoarthritis, left hip: Secondary | ICD-10-CM | POA: Diagnosis not present

## 2019-07-19 MED FILL — tiZANidine HCL 2 MG TABS: 2 | 13 days supply | Qty: 40 | Fill #0

## 2019-07-19 MED FILL — OXYCODONE-ACETAMINOPHEN 5-3: 5-325 | 5 days supply | Qty: 40 | Fill #0

## 2019-07-20 DIAGNOSIS — Z01812 Encounter for preprocedural laboratory examination: Secondary | ICD-10-CM | POA: Diagnosis not present

## 2019-07-29 DIAGNOSIS — L814 Other melanin hyperpigmentation: Secondary | ICD-10-CM | POA: Diagnosis not present

## 2019-07-29 DIAGNOSIS — D2239 Melanocytic nevi of other parts of face: Secondary | ICD-10-CM | POA: Diagnosis not present

## 2019-07-29 DIAGNOSIS — L853 Xerosis cutis: Secondary | ICD-10-CM | POA: Diagnosis not present

## 2019-07-29 DIAGNOSIS — L7211 Pilar cyst: Secondary | ICD-10-CM | POA: Diagnosis not present

## 2019-07-29 DIAGNOSIS — L821 Other seborrheic keratosis: Secondary | ICD-10-CM | POA: Diagnosis not present

## 2019-07-29 DIAGNOSIS — D2262 Melanocytic nevi of left upper limb, including shoulder: Secondary | ICD-10-CM | POA: Diagnosis not present

## 2019-07-29 DIAGNOSIS — D225 Melanocytic nevi of trunk: Secondary | ICD-10-CM | POA: Diagnosis not present

## 2019-07-29 DIAGNOSIS — L603 Nail dystrophy: Secondary | ICD-10-CM | POA: Diagnosis not present

## 2019-07-29 MED FILL — VENLAFAXINE HCL ER 75 MG CA: 75 | 90 days supply | Qty: 90 | Fill #2 | Status: TO

## 2019-08-01 ENCOUNTER — Ambulatory Visit: Payer: PPO | Attending: Internal Medicine

## 2019-08-01 DIAGNOSIS — Z23 Encounter for immunization: Secondary | ICD-10-CM

## 2019-08-01 NOTE — Progress Notes (Signed)
   Covid-19 Vaccination Clinic  Name:  Victoire Leavy    MRN: [id number] DOB: December 30, 1950  08/01/2019  Ms. Yochum was observed post Covid-19 immunization for 15 minutes without incident. She was provided with Vaccine Information Sheet and instruction to access the V-Safe system.   Ms. Stonecypher was instructed to call 911 with any severe reactions post vaccine: Marland Kitchen Difficulty breathing  . Swelling of face and throat  . A fast heartbeat  . A bad rash all over body  . Dizziness and weakness   Immunizations Administered    Name Date Dose VIS Date Route   Pfizer COVID-19 Vaccine 08/01/2019 10:16 AM 0.3 mL 05/06/2019 Intramuscular   Manufacturer: [place]   Lot: [id number]   [place]: [id number]

## 2019-08-04 DIAGNOSIS — L7211 Pilar cyst: Secondary | ICD-10-CM | POA: Diagnosis not present

## 2019-08-10 DIAGNOSIS — M1612 Unilateral primary osteoarthritis, left hip: Secondary | ICD-10-CM | POA: Diagnosis not present

## 2019-08-10 HISTORY — PX: TOTAL HIP ARTHROPLASTY: SHX124

## 2019-08-10 HISTORY — PX: JOINT REPLACEMENT: SHX530

## 2019-08-11 DIAGNOSIS — M1612 Unilateral primary osteoarthritis, left hip: Secondary | ICD-10-CM | POA: Diagnosis not present

## 2019-08-11 DIAGNOSIS — Z471 Aftercare following joint replacement surgery: Secondary | ICD-10-CM | POA: Diagnosis not present

## 2019-08-11 MED FILL — CELECOXIB 200 MG CAP: 200 | 20 days supply | Qty: 40 | Fill #0

## 2019-08-12 DIAGNOSIS — Z96641 Presence of right artificial hip joint: Secondary | ICD-10-CM | POA: Diagnosis not present

## 2019-08-24 ENCOUNTER — Other Ambulatory Visit: Payer: Self-pay | Admitting: Internal Medicine

## 2019-08-24 MED FILL — buPROPion HCL ER (XL) 150 M: 150 | 90 days supply | Qty: 90 | Fill #0

## 2019-08-25 DIAGNOSIS — M25652 Stiffness of left hip, not elsewhere classified: Secondary | ICD-10-CM | POA: Diagnosis not present

## 2019-08-25 DIAGNOSIS — Z9889 Other specified postprocedural states: Secondary | ICD-10-CM | POA: Diagnosis not present

## 2019-08-25 DIAGNOSIS — M6281 Muscle weakness (generalized): Secondary | ICD-10-CM | POA: Diagnosis not present

## 2019-08-25 DIAGNOSIS — Z96642 Presence of left artificial hip joint: Secondary | ICD-10-CM | POA: Diagnosis not present

## 2019-09-20 ENCOUNTER — Other Ambulatory Visit: Payer: Self-pay | Admitting: Internal Medicine

## 2019-09-20 DIAGNOSIS — F3342 Major depressive disorder, recurrent, in full remission: Secondary | ICD-10-CM

## 2019-09-20 NOTE — Telephone Encounter (Signed)
PCP aware of lab results- 12/16/18

## 2019-09-22 DIAGNOSIS — Z9889 Other specified postprocedural states: Secondary | ICD-10-CM | POA: Diagnosis not present

## 2019-09-22 DIAGNOSIS — Z96642 Presence of left artificial hip joint: Secondary | ICD-10-CM | POA: Diagnosis not present

## 2019-09-23 MED FILL — VENLAFAXINE HCL ER 150 MG C: 150 | 90 days supply | Qty: 90 | Fill #0

## 2019-10-17 ENCOUNTER — Other Ambulatory Visit: Payer: Self-pay | Admitting: Internal Medicine

## 2019-10-17 DIAGNOSIS — F3342 Major depressive disorder, recurrent, in full remission: Secondary | ICD-10-CM

## 2019-10-17 MED FILL — VENLAFAXINE HCL ER 75 MG CA: 75 | 90 days supply | Qty: 90 | Fill #0 | Status: TO

## 2019-10-17 NOTE — Telephone Encounter (Signed)
Requested Prescriptions  Pending Prescriptions Disp Refills  . venlafaxine XR (EFFEXOR-XR) 75 MG 24 hr capsule [Pharmacy Med Name: VENLAFAXINE HCL ER 75 MG CA 75 Capsule] 90 capsule 0    Sig: TAKE 1 CAPSULE BY MOUTH DAILY WITH BREAKFAST.     Psychiatry: Antidepressants - SNRI - desvenlafaxine & venlafaxine Failed - 10/17/2019  9:53 AM      Failed - LDL in normal range and within 360 days    LDL Calculated  Date Value Ref Range Status  12/16/2018 156 (H) 0 - 99 mg/dL Final         Failed - Total Cholesterol in normal range and within 360 days    Cholesterol, Total  Date Value Ref Range Status  12/16/2018 260 (H) 100 - 199 mg/dL Final         Failed - Triglycerides in normal range and within 360 days    Triglycerides  Date Value Ref Range Status  12/16/2018 151 (H) 0 - 149 mg/dL Final         Passed - Completed PHQ-2 or PHQ-9 in the last 360 days.      Passed - Last BP in normal range    BP Readings from Last 1 Encounters:  06/13/19 134/76         Passed - Valid encounter within last 6 months    Recent Outpatient Visits          4 months ago Hip pain, chronic, left   [facility] Glean Hess, MD   10 months ago Annual physical exam   [facility] Glean Hess, MD   1 year ago Annual physical exam   [facility] Glean Hess, MD   2 years ago Depression, major, recurrent, mild [facility] [place])   [facility] Glean Hess, MD   3 years ago Annual physical exam   [facility] Glean Hess, MD      Future Appointments            In 2 months Army Melia Jesse Sans, MD [facility]

## 2019-11-10 MED FILL — buPROPion HCL ER (XL) 150 M: 150 | 90 days supply | Qty: 90 | Fill #1

## 2019-12-15 ENCOUNTER — Other Ambulatory Visit: Payer: Self-pay | Admitting: Internal Medicine

## 2019-12-15 DIAGNOSIS — F3342 Major depressive disorder, recurrent, in full remission: Secondary | ICD-10-CM

## 2019-12-15 MED FILL — VENLAFAXINE HCL ER 150 MG C: 150 | 90 days supply | Qty: 90 | Fill #0

## 2019-12-15 NOTE — Telephone Encounter (Signed)
Patient has appointment 12/21/19- courtesy RF given

## 2019-12-20 NOTE — Progress Notes (Signed)
Date:  12/21/2019   Name:  Samantha Barton   DOB:  1950-06-14   MRN:  [id number]   Chief Complaint: Annual Exam (breast exam ,no pap)  Samantha Barton is a 69 y.o. female who presents today for her Complete Annual Exam. She feels well. She reports exercising horseback riding x3 times a week. She reports she is sleeping fairly well. Breast complaints none.  She had left THA in March and is doing great.  Mammogram: 02/2019 DEXA: 02/2019 osteopenia Colonoscopy: Cologuard 05/2019  Immunization History  Administered Date(s) Administered  . Influenza, High Dose Seasonal PF 03/17/2018, 02/07/2019  . Influenza,inj,Quad PF,6+ Mos 02/19/2017  . Influenza-Unspecified 02/08/2016, 02/07/2019  . PFIZER SARS-COV-2 Vaccination 07/04/2019, 08/01/2019  . Pneumococcal Conjugate-13 03/12/2016  . Pneumococcal Polysaccharide-23 05/27/2009, 12/15/2017  . Tdap 05/27/2013  . Zoster 05/27/2012    HPI  Lab Results  Component Value Date   CREATININE 0.95 12/16/2018   BUN 25 12/16/2018   NA 140 12/16/2018   K 4.2 12/16/2018   CL 100 12/16/2018   CO2 25 12/16/2018   Lab Results  Component Value Date   CHOL 260 (H) 12/16/2018   HDL 74 12/16/2018   LDLCALC 156 (H) 12/16/2018   TRIG 151 (H) 12/16/2018   CHOLHDL 3.5 12/16/2018   Lab Results  Component Value Date   TSH 1.600 12/16/2018   No results found for: HGBA1C Lab Results  Component Value Date   WBC 5.9 12/16/2018   HGB 13.3 12/16/2018   HCT 38.1 12/16/2018   MCV 97 12/16/2018   PLT 328 12/16/2018   Lab Results  Component Value Date   ALT 14 12/16/2018   AST 18 12/16/2018   ALKPHOS 83 12/16/2018   BILITOT 0.3 12/16/2018     Review of Systems  Constitutional: Negative for chills, fatigue and fever.  HENT: Negative for congestion, hearing loss, tinnitus, trouble swallowing and voice change.   Eyes: Negative for visual disturbance.  Respiratory: Negative for cough, chest tightness, shortness of breath and wheezing.     Cardiovascular: Negative for chest pain, palpitations and leg swelling.  Gastrointestinal: Negative for abdominal pain, constipation, diarrhea and vomiting.  Endocrine: Negative for polydipsia and polyuria.  Genitourinary: Negative for dysuria, frequency, genital sores, vaginal bleeding and vaginal discharge.  Musculoskeletal: Negative for arthralgias (doing well s/p left THA in march), gait problem and joint swelling.  Skin: Negative for color change and rash.  Neurological: Negative for dizziness, tremors, light-headedness and headaches.  Hematological: Negative for adenopathy. Does not bruise/bleed easily.  Psychiatric/Behavioral: Negative for dysphoric mood and sleep disturbance. The patient is not nervous/anxious.     Patient Active Problem List   Diagnosis Date Noted  . Shortness of breath 12/16/2018  . Recurrent major depressive disorder, in full remission ([place]) 06/24/2018  . Osteopenia determined by x-ray 03/19/2016  . Hypertriglyceridemia 01/14/2015    No Known Allergies  Past Surgical History:  Procedure Laterality Date  . BREAST BIOPSY Right 1998   bx/clip-neg  . ORBITAL FRACTURE SURGERY  04/25/2015  . ORIF ORBITAL FRACTURE Right 04/25/2015   Procedure: OPEN REDUCTION INTERNAL FIXATION (ORIF) ORBITAL FRACTURE;  Surgeon: Clyde Canterbury, MD;  Location: [facility] ORS;  Service: ENT;  Laterality: Right;  . SIGMOIDOSCOPY    . TOTAL HIP ARTHROPLASTY Left 08/10/2019    Social History   Tobacco Use  . Smoking status: Never Smoker  . Smokeless tobacco: Never Used  . Tobacco comment: smoking cessation materials not required  Vaping Use  . Vaping Use: Never  used  Substance Use Topics  . Alcohol use: No    Alcohol/week: 0.0 standard drinks  . Drug use: No     Medication list has been reviewed and updated.  Current Meds  Medication Sig  . buPROPion (WELLBUTRIN XL) 150 MG 24 hr tablet TAKE 1 TABLET BY MOUTH DAILY.  . Calcium Carbonate (CALCIUM 500 PO) Take by mouth.  .  cholecalciferol (VITAMIN D) 1000 units tablet Take 1,000 Units by mouth daily.  Marland Kitchen ibuprofen (ADVIL) 100 MG/5ML suspension Take 200 mg by mouth every 4 (four) hours as needed.  . tretinoin (RETIN-A) 0.05 % cream RETIN-A, 0.05% (External Cream) - Historical Medication  (0.05 %) Active  . venlafaxine XR (EFFEXOR-XR) 150 MG 24 hr capsule Take 1 capsule (150 mg total) by mouth daily.  Marland Kitchen venlafaxine XR (EFFEXOR-XR) 75 MG 24 hr capsule 1 po daily  . [DISCONTINUED] venlafaxine XR (EFFEXOR-XR) 150 MG 24 hr capsule TAKE 1 CAPSULE BY MOUTH DAILY  . [DISCONTINUED] venlafaxine XR (EFFEXOR-XR) 75 MG 24 hr capsule TAKE 1 CAPSULE BY MOUTH DAILY WITH BREAKFAST.    PHQ 2/9 Scores 12/21/2019 06/13/2019 03/28/2019 12/16/2018  PHQ - 2 Score 0 0 0 0  PHQ- 9 Score 0 3 - 0    GAD 7 : Generalized Anxiety Score 12/21/2019  Nervous, Anxious, on Edge 0  Control/stop worrying 0  Worry too much - different things 0  Trouble relaxing 0  Restless 0  Easily annoyed or irritable 0  Afraid - awful might happen 0  Total GAD 7 Score 0  Anxiety Difficulty Not difficult at all    BP Readings from Last 3 Encounters:  12/21/19 114/70  06/13/19 134/76  12/23/18 (!) 149/88    Physical Exam Vitals and nursing note reviewed.  Constitutional:      General: She is not in acute distress.    Appearance: She is well-developed.  HENT:     Head: Normocephalic and atraumatic.     Right Ear: Tympanic membrane and ear canal normal.     Left Ear: Tympanic membrane and ear canal normal.     Nose:     Right Sinus: No maxillary sinus tenderness.     Left Sinus: No maxillary sinus tenderness.  Eyes:     General: No scleral icterus.       Right eye: No discharge.        Left eye: No discharge.     Conjunctiva/sclera: Conjunctivae normal.  Neck:     Thyroid: No thyromegaly.     Vascular: No carotid bruit.  Cardiovascular:     Rate and Rhythm: Normal rate and regular rhythm.     Pulses: Normal pulses.     Heart sounds: Normal  heart sounds.  Pulmonary:     Effort: Pulmonary effort is normal. No respiratory distress.     Breath sounds: No wheezing.  Chest:     Breasts:        Right: No mass, nipple discharge, skin change or tenderness.        Left: No mass, nipple discharge, skin change or tenderness.  Abdominal:     General: Bowel sounds are normal.     Palpations: Abdomen is soft.     Tenderness: There is no abdominal tenderness.  Musculoskeletal:     Cervical back: Normal range of motion. No erythema.     Right lower leg: No edema.     Left lower leg: No edema.  Lymphadenopathy:     Cervical: No cervical adenopathy.  Skin:    General: Skin is warm and dry.     Findings: No rash.  Neurological:     Mental Status: She is alert and oriented to person, place, and time.     Cranial Nerves: No cranial nerve deficit.     Sensory: No sensory deficit.     Deep Tendon Reflexes: Reflexes are normal and symmetric.  Psychiatric:        Attention and Perception: Attention normal.        Mood and Affect: Mood normal.     Wt Readings from Last 3 Encounters:  12/21/19 136 lb (61.7 kg)  06/13/19 133 lb (60.3 kg)  12/16/18 133 lb 9.6 oz (60.6 kg)    BP 114/70   Pulse 88   Temp 98 F (36.7 C) (Oral)   Ht 5\' 4"  (1.626 m)   Wt 136 lb (61.7 kg)   SpO2 97%   BMI 23.34 kg/m   Assessment and Plan: 1. Annual physical exam Normal exam Doing well s/p left hip replacement Continue healthy diet, exercise - CBC with Differential/Platelet - Comprehensive metabolic panel - POCT urinalysis dipstick  2. Encounter for screening mammogram for breast cancer Schedule in October at [place]; Future  3. Hypertriglyceridemia Check labs and advise - Lipid panel  4. Recurrent major depressive disorder, in full remission ([place]) Clinically stable on current regimen with good control of symptoms, No SI or HI. Will continue current therapy with Effexor and bupropion - TSH - venlafaxine XR  (EFFEXOR-XR) 75 MG 24 hr capsule; 1 po daily  Dispense: 90 capsule; Refill: 3 - venlafaxine XR (EFFEXOR-XR) 150 MG 24 hr capsule; Take 1 capsule (150 mg total) by mouth daily.  Dispense: 90 capsule; Refill: 3  5. Osteopenia determined by x-ray Continue calcium and vitamin D - VITAMIN D 25 Hydroxy (Vit-D Deficiency, Fractures)   Partially dictated using [employer]. Any errors are unintentional.  Halina Maidens, MD [facility]  12/21/2019

## 2019-12-21 ENCOUNTER — Other Ambulatory Visit: Payer: Self-pay

## 2019-12-21 ENCOUNTER — Ambulatory Visit (INDEPENDENT_AMBULATORY_CARE_PROVIDER_SITE_OTHER): Payer: PPO | Admitting: Internal Medicine

## 2019-12-21 ENCOUNTER — Encounter: Payer: Self-pay | Admitting: Internal Medicine

## 2019-12-21 ENCOUNTER — Other Ambulatory Visit: Payer: Self-pay | Admitting: Internal Medicine

## 2019-12-21 VITALS — BP 114/70 | HR 88 | Temp 98.0°F | Ht 64.0 in | Wt 136.0 lb

## 2019-12-21 DIAGNOSIS — Z0000 Encounter for general adult medical examination without abnormal findings: Secondary | ICD-10-CM

## 2019-12-21 DIAGNOSIS — E781 Pure hyperglyceridemia: Secondary | ICD-10-CM | POA: Diagnosis not present

## 2019-12-21 DIAGNOSIS — F3342 Major depressive disorder, recurrent, in full remission: Secondary | ICD-10-CM

## 2019-12-21 DIAGNOSIS — M8580 Other specified disorders of bone density and structure, unspecified site: Secondary | ICD-10-CM

## 2019-12-21 DIAGNOSIS — Z1231 Encounter for screening mammogram for malignant neoplasm of breast: Secondary | ICD-10-CM | POA: Diagnosis not present

## 2019-12-21 LAB — POCT URINALYSIS DIPSTICK
Bilirubin, UA: NEGATIVE
Blood, UA: NEGATIVE
Glucose, UA: NEGATIVE
Ketones, UA: NEGATIVE
Leukocytes, UA: NEGATIVE
Nitrite, UA: NEGATIVE
Protein, UA: POSITIVE — AB
Spec Grav, UA: 1.015 (ref 1.010–1.025)
Urobilinogen, UA: 0.2 E.U./dL
pH, UA: 7 (ref 5.0–8.0)

## 2019-12-21 MED ORDER — VENLAFAXINE HCL ER 75 MG PO CP24: ORAL_CAPSULE | ORAL | 3 refills | Status: DC

## 2019-12-21 MED ORDER — VENLAFAXINE HCL ER 150 MG PO CP24: 150.0000 mg | ORAL_CAPSULE | Freq: Every day | ORAL | 3 refills | Status: DC

## 2019-12-22 LAB — CBC WITH DIFFERENTIAL/PLATELET
Basophils Absolute: 0.1 10*3/uL (ref 0.0–0.2)
Basos: 1 %
EOS (ABSOLUTE): 0.1 10*3/uL (ref 0.0–0.4)
Eos: 2 %
Hematocrit: 37.8 % (ref 34.0–46.6)
Hemoglobin: 13.1 g/dL (ref 11.1–15.9)
Immature Grans (Abs): 0 10*3/uL (ref 0.0–0.1)
Immature Granulocytes: 0 %
Lymphocytes Absolute: 2.3 10*3/uL (ref 0.7–3.1)
Lymphs: 38 %
MCH: 33.1 pg — ABNORMAL HIGH (ref 26.6–33.0)
MCHC: 34.7 g/dL (ref 31.5–35.7)
MCV: 96 fL (ref 79–97)
Monocytes Absolute: 0.5 10*3/uL (ref 0.1–0.9)
Monocytes: 8 %
Neutrophils Absolute: 3.2 10*3/uL (ref 1.4–7.0)
Neutrophils: 51 %
Platelets: 281 10*3/uL (ref 150–450)
RBC: 3.96 x10E6/uL (ref 3.77–5.28)
RDW: 11.6 % — ABNORMAL LOW (ref 11.7–15.4)
WBC: 6.1 10*3/uL (ref 3.4–10.8)

## 2019-12-22 LAB — COMPREHENSIVE METABOLIC PANEL
ALT: 16 IU/L (ref 0–32)
AST: 29 IU/L (ref 0–40)
Albumin/Globulin Ratio: 1.8 (ref 1.2–2.2)
Albumin: 4.4 g/dL (ref 3.8–4.8)
Alkaline Phosphatase: 103 IU/L (ref 48–121)
BUN/Creatinine Ratio: 20 (ref 12–28)
BUN: 21 mg/dL (ref 8–27)
Bilirubin Total: 0.4 mg/dL (ref 0.0–1.2)
CO2: 23 mmol/L (ref 20–29)
Calcium: 10.5 mg/dL — ABNORMAL HIGH (ref 8.7–10.3)
Chloride: 102 mmol/L (ref 96–106)
Creatinine, Ser: 1.03 mg/dL — ABNORMAL HIGH (ref 0.57–1.00)
GFR calc Af Amer: 65 mL/min/{1.73_m2} (ref >59)
GFR calc non Af Amer: 56 mL/min/{1.73_m2} — ABNORMAL LOW (ref >59)
Globulin, Total: 2.5 g/dL (ref 1.5–4.5)
Glucose: 103 mg/dL — ABNORMAL HIGH (ref 65–99)
Potassium: 5 mmol/L (ref 3.5–5.2)
Sodium: 141 mmol/L (ref 134–144)
Total Protein: 6.9 g/dL (ref 6.0–8.5)

## 2019-12-22 LAB — TSH: TSH: 1.98 u[IU]/mL (ref 0.450–4.500)

## 2019-12-22 LAB — LIPID PANEL
Chol/HDL Ratio: 3.3 ratio (ref 0.0–4.4)
Cholesterol, Total: 246 mg/dL — ABNORMAL HIGH (ref 100–199)
HDL: 74 mg/dL (ref >39)
LDL Chol Calc (NIH): 152 mg/dL — ABNORMAL HIGH (ref 0–99)
Triglycerides: 116 mg/dL (ref 0–149)
VLDL Cholesterol Cal: 20 mg/dL (ref 5–40)

## 2019-12-22 LAB — VITAMIN D 25 HYDROXY (VIT D DEFICIENCY, FRACTURES): Vit D, 25-Hydroxy: 35.6 ng/mL (ref 30.0–100.0)

## 2019-12-26 MED FILL — VENLAFAXINE HCL ER 75 MG CA: 75 | 90 days supply | Qty: 90 | Fill #0

## 2020-01-18 MED FILL — VENLAFAXINE HCL ER 75 MG CA: 75 | 90 days supply | Qty: 90 | Fill #0

## 2020-01-23 MED FILL — AMOXICILLIN 500 MG CAPSULE: 500 | 1 days supply | Qty: 4 | Fill #0

## 2020-02-07 DIAGNOSIS — M25512 Pain in left shoulder: Secondary | ICD-10-CM | POA: Diagnosis not present

## 2020-02-07 DIAGNOSIS — Z96642 Presence of left artificial hip joint: Secondary | ICD-10-CM | POA: Diagnosis not present

## 2020-02-07 DIAGNOSIS — Z471 Aftercare following joint replacement surgery: Secondary | ICD-10-CM | POA: Diagnosis not present

## 2020-02-20 MED FILL — buPROPion HCL ER (XL) 150 M: 150 | 90 days supply | Qty: 90 | Fill #2

## 2020-03-04 DIAGNOSIS — Z23 Encounter for immunization: Secondary | ICD-10-CM | POA: Diagnosis not present

## 2020-03-13 ENCOUNTER — Other Ambulatory Visit (HOSPITAL_COMMUNITY): Payer: Self-pay

## 2020-03-13 MED FILL — AMOXICILLIN 500 MG CAPSULE: 500 | 1 days supply | Qty: 4 | Fill #0

## 2020-03-21 MED FILL — VENLAFAXINE HCL ER 150 MG C: 150 | 90 days supply | Qty: 90 | Fill #0

## 2020-03-28 ENCOUNTER — Ambulatory Visit: Payer: PPO

## 2020-04-14 IMAGING — MG MM DIGITAL SCREENING BILAT W/ TOMO W/ CAD
8 series · 8 of 24 positions shown · non-contrast
Comparison: Previous exam(s).

CLINICAL DATA: Screening.

EXAM:
DIGITAL SCREENING BILATERAL MAMMOGRAM WITH TOMO AND CAD

[L MLO synth-2D]
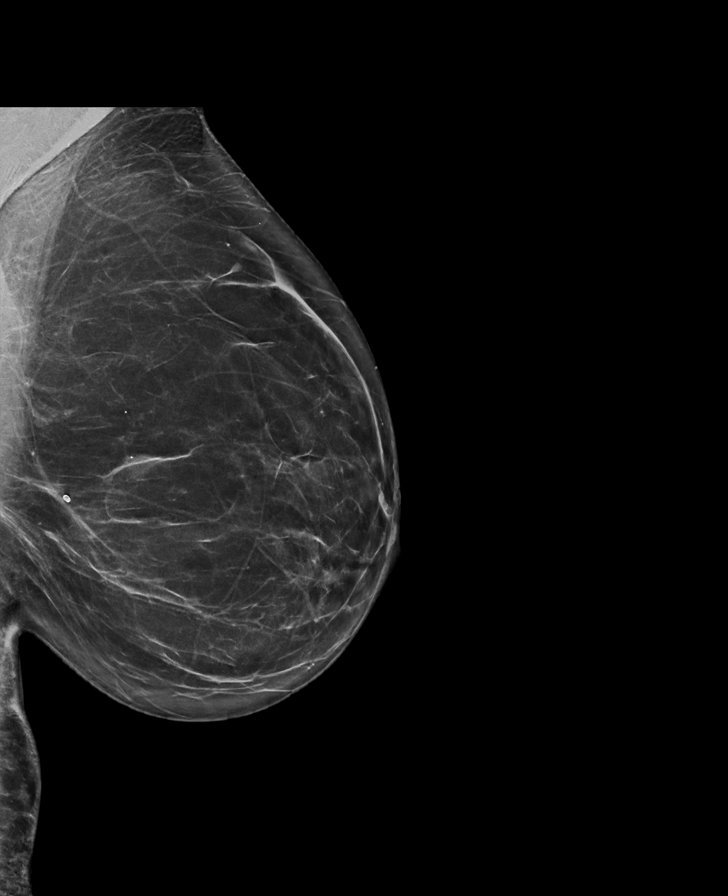

[R MLO synth-2D]
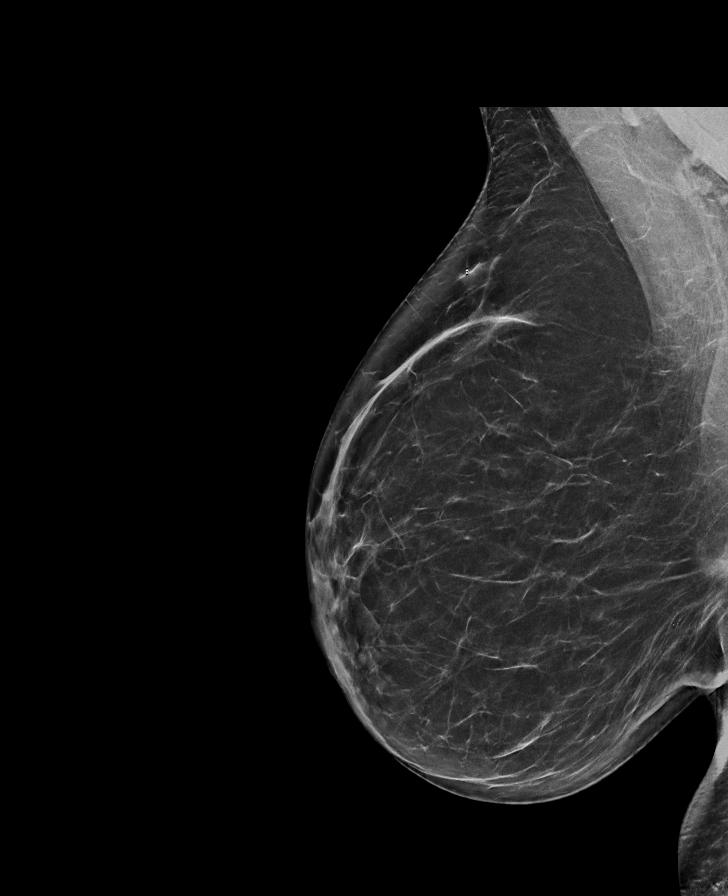

[R CC synth-2D]
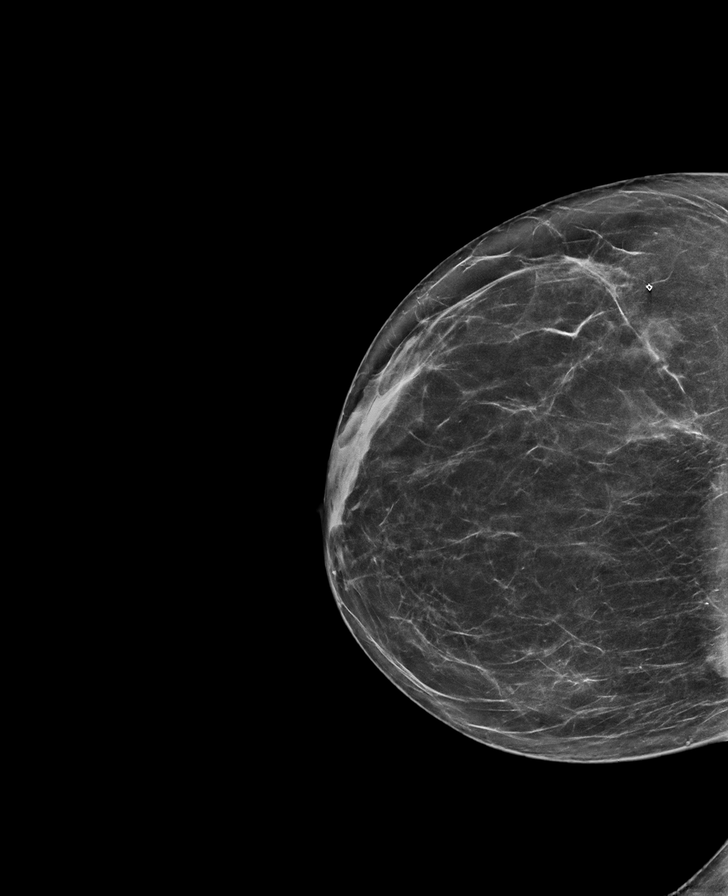

[L CC synth-2D]
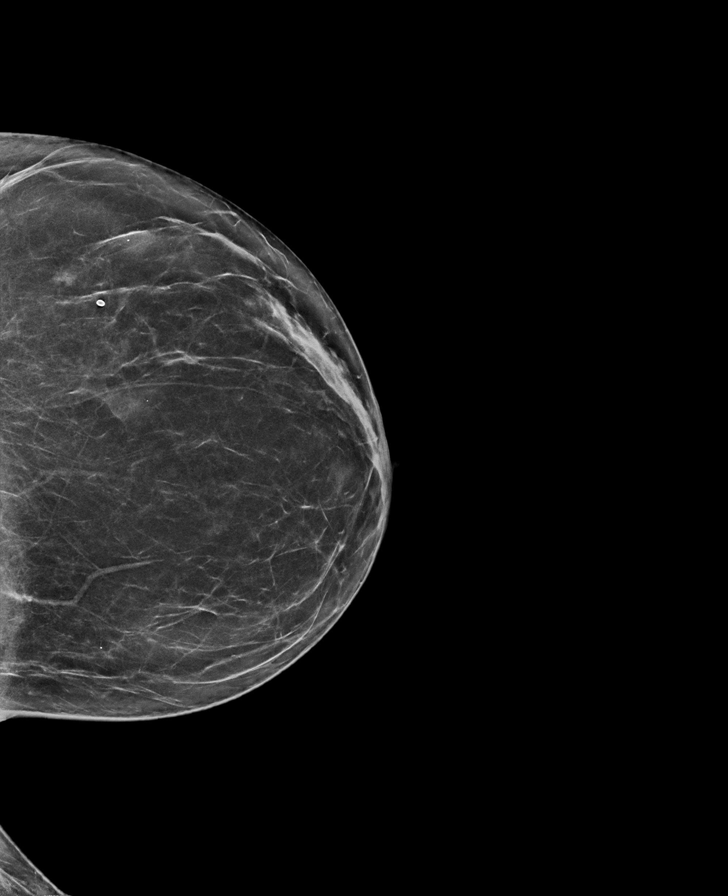

[R MLO tomo · tomo slice 39/77.0]
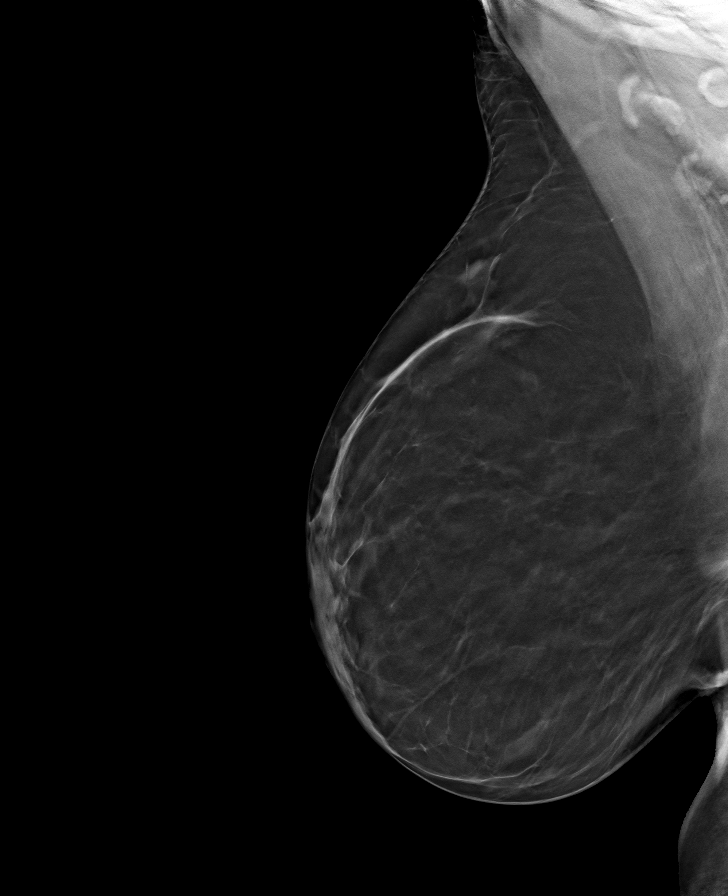

[L MLO tomo · tomo slice 39/77.0]
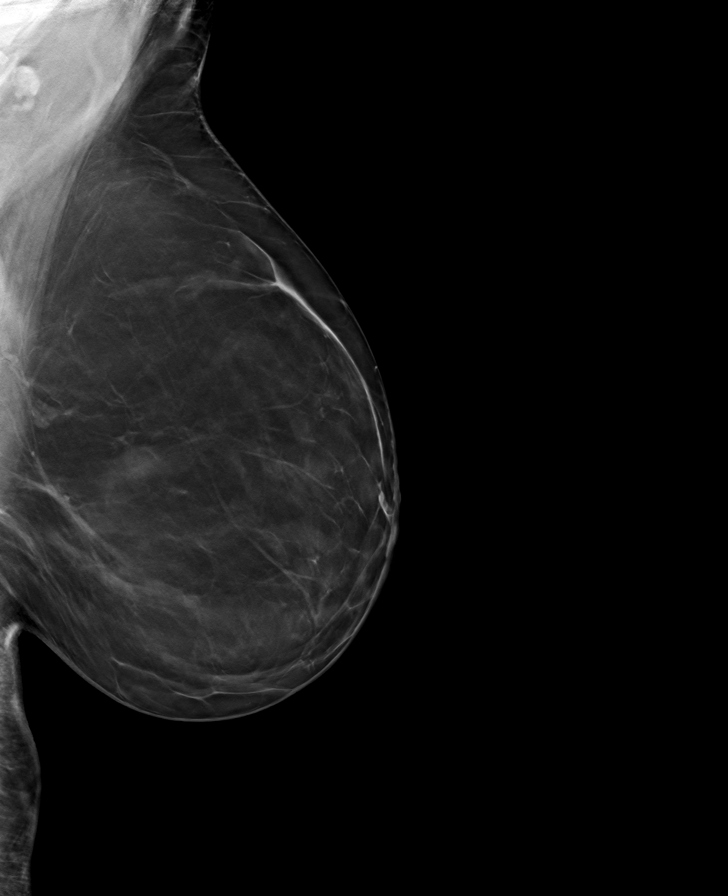

[L CC tomo · tomo slice 35/68.0]
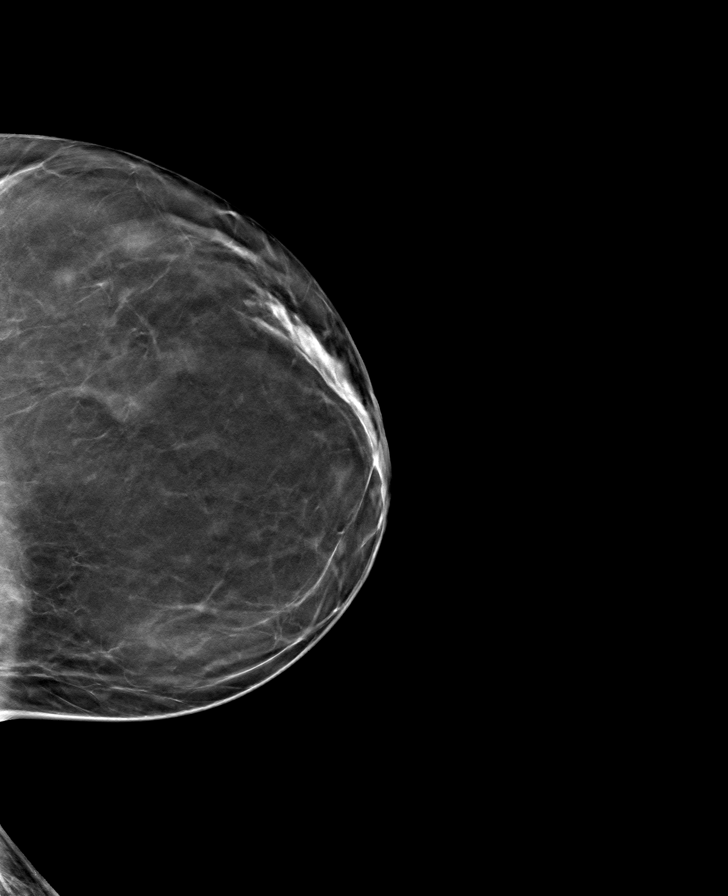

[R CC tomo · tomo slice 35/70.0]
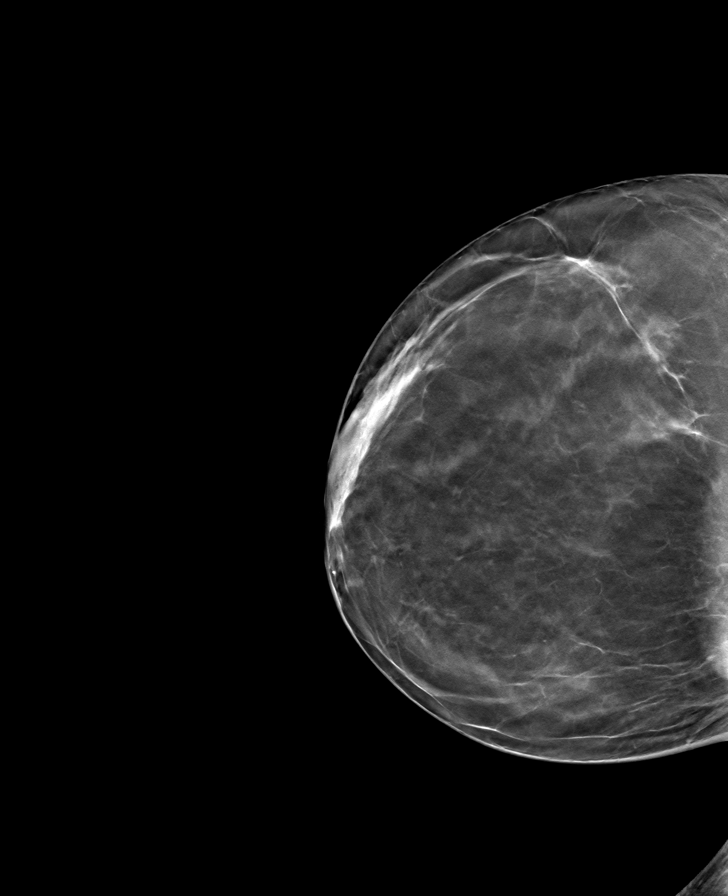

[8 of 24 positions shown; findings below may reference images not displayed]

ACR Breast Density Category b: There are scattered areas of
fibroglandular density.
FINDINGS: There are no findings suspicious for malignancy. Images were
processed with CAD.
IMPRESSION: No mammographic evidence of malignancy. A result letter of this
screening mammogram will be mailed directly to the patient.

RECOMMENDATION:
Screening mammogram in one year. (Code:[id number])

BI-RADS CATEGORY  1: Negative.

## 2020-04-16 ENCOUNTER — Ambulatory Visit
Admission: RE | Admit: 2020-04-16 | Discharge: 2020-04-16 | Disposition: A | Discharge status: Home / Self Care | Payer: PPO | Source: Ambulatory Visit | Attending: Internal Medicine | Admitting: Internal Medicine

## 2020-04-16 ENCOUNTER — Other Ambulatory Visit: Payer: Self-pay

## 2020-04-16 DIAGNOSIS — Z1231 Encounter for screening mammogram for malignant neoplasm of breast: Secondary | ICD-10-CM

## 2020-04-25 MED FILL — VENLAFAXINE HCL ER 75 MG CA: 75 | 90 days supply | Qty: 90 | Fill #1

## 2020-05-08 ENCOUNTER — Other Ambulatory Visit (HOSPITAL_COMMUNITY): Payer: Self-pay

## 2020-05-08 MED FILL — NITROFURANTOIN MONO-MCR 100: 100 | 5 days supply | Qty: 10 | Fill #0

## 2020-05-21 MED FILL — buPROPion HCL ER (XL) 150 M: 150 | 90 days supply | Qty: 90 | Fill #3

## 2020-06-18 MED FILL — VENLAFAXINE HCL ER 150 MG C: 150 | 90 days supply | Qty: 90 | Fill #1

## 2020-07-16 ENCOUNTER — Ambulatory Visit (INDEPENDENT_AMBULATORY_CARE_PROVIDER_SITE_OTHER): Payer: PPO

## 2020-07-16 DIAGNOSIS — Z0000 Encounter for general adult medical examination without abnormal findings: Secondary | ICD-10-CM

## 2020-07-16 NOTE — Patient Instructions (Signed)
Samantha Barton , Thank you for taking time to come for your Medicare Wellness Visit. I appreciate your ongoing commitment to your health goals. Please review the following plan we discussed and let me know if I can assist you in the future.   Screening recommendations/referrals: Colonoscopy: Cologuard completed 06/07/19. Repeat in 2024 Mammogram: done 04/16/20 Bone Density: done 03/01/19 Recommended yearly ophthalmology/optometry visit for glaucoma screening and checkup Recommended yearly dental visit for hygiene and checkup  Vaccinations: Influenza vaccine: done 03/04/20 Pneumococcal vaccine: done 12/15/17 Tdap vaccine: done 2015 Shingles vaccine: Shingrix discussed. Please contact your pharmacy for coverage information.  Covid-19: done 07/04/19 & 08/01/19; please bring a copy of your vaccine record to your next appt for booster information   Advanced directives: Please bring a copy of your health care power of attorney and living will to the office at your convenience.  Conditions/risks identified: Keep up the great work!  Next appointment: Follow up in one year for your annual wellness visit    Preventive Care 65 Years and Older, Female Preventive care refers to lifestyle choices and visits with your health care provider that can promote health and wellness. What does preventive care include?  A yearly physical exam. This is also called an annual well check.  Dental exams once or twice a year.  Routine eye exams. Ask your health care provider how often you should have your eyes checked.  Personal lifestyle choices, including:  Daily care of your teeth and gums.  Regular physical activity.  Eating a healthy diet.  Avoiding tobacco and drug use.  Limiting alcohol use.  Practicing safe sex.  Taking low-dose aspirin every day.  Taking vitamin and mineral supplements as recommended by your health care provider. What happens during an annual well check? The services and  screenings done by your health care provider during your annual well check will depend on your age, overall health, lifestyle risk factors, and family history of disease. Counseling  Your health care provider may ask you questions about your:  Alcohol use.  Tobacco use.  Drug use.  Emotional well-being.  Home and relationship well-being.  Sexual activity.  Eating habits.  History of falls.  Memory and ability to understand (cognition).  Work and work Statistician.  Reproductive health. Screening  You may have the following tests or measurements:  Height, weight, and BMI.  Blood pressure.  Lipid and cholesterol levels. These may be checked every 5 years, or more frequently if you are over 16 years old.  Skin check.  Lung cancer screening. You may have this screening every year starting at age 72 if you have a 30-pack-year history of smoking and currently smoke or have quit within the past 15 years.  Fecal occult blood test (FOBT) of the stool. You may have this test every year starting at age 48.  Flexible sigmoidoscopy or colonoscopy. You may have a sigmoidoscopy every 5 years or a colonoscopy every 10 years starting at age 47.  Hepatitis C blood test.  Hepatitis B blood test.  Sexually transmitted disease (STD) testing.  Diabetes screening. This is done by checking your blood sugar (glucose) after you have not eaten for a while (fasting). You may have this done every 1-3 years.  Bone density scan. This is done to screen for osteoporosis. You may have this done starting at age 75.  Mammogram. This may be done every 1-2 years. Talk to your health care provider about how often you should have regular mammograms. Talk with your health  care provider about your test results, treatment options, and if necessary, the need for more tests. Vaccines  Your health care provider may recommend certain vaccines, such as:  Influenza vaccine. This is recommended every  year.  Tetanus, diphtheria, and acellular pertussis (Tdap, Td) vaccine. You may need a Td booster every 10 years.  Zoster vaccine. You may need this after age 44.  Pneumococcal 13-valent conjugate (PCV13) vaccine. One dose is recommended after age 4.  Pneumococcal polysaccharide (PPSV23) vaccine. One dose is recommended after age 52. Talk to your health care provider about which screenings and vaccines you need and how often you need them. This information is not intended to replace advice given to you by your health care provider. Make sure you discuss any questions you have with your health care provider. Document Released: 06/08/2015 Document Revised: 01/30/2016 Document Reviewed: 03/13/2015 Elsevier Interactive Patient Education  2017 [place] Prevention in the Home Falls can cause injuries. They can happen to people of all ages. There are many things you can do to make your home safe and to help prevent falls. What can I do on the outside of my home?  Regularly fix the edges of walkways and driveways and fix any cracks.  Remove anything that might make you trip as you walk through a door, such as a raised step or threshold.  Trim any bushes or trees on the path to your home.  Use bright outdoor lighting.  Clear any walking paths of anything that might make someone trip, such as rocks or tools.  Regularly check to see if handrails are loose or broken. Make sure that both sides of any steps have handrails.  Any raised decks and porches should have guardrails on the edges.  Have any leaves, snow, or ice cleared regularly.  Use sand or salt on walking paths during winter.  Clean up any spills in your garage right away. This includes oil or grease spills. What can I do in the bathroom?  Use night lights.  Install grab bars by the toilet and in the tub and shower. Do not use towel bars as grab bars.  Use non-skid mats or decals in the tub or shower.  If you  need to sit down in the shower, use a plastic, non-slip stool.  Keep the floor dry. Clean up any water that spills on the floor as soon as it happens.  Remove soap buildup in the tub or shower regularly.  Attach bath mats securely with double-sided non-slip rug tape.  Do not have throw rugs and other things on the floor that can make you trip. What can I do in the bedroom?  Use night lights.  Make sure that you have a light by your bed that is easy to reach.  Do not use any sheets or blankets that are too big for your bed. They should not hang down onto the floor.  Have a firm chair that has side arms. You can use this for support while you get dressed.  Do not have throw rugs and other things on the floor that can make you trip. What can I do in the kitchen?  Clean up any spills right away.  Avoid walking on wet floors.  Keep items that you use a lot in easy-to-reach places.  If you need to reach something above you, use a strong step stool that has a grab bar.  Keep electrical cords out of the way.  Do not use floor  polish or wax that makes floors slippery. If you must use wax, use non-skid floor wax.  Do not have throw rugs and other things on the floor that can make you trip. What can I do with my stairs?  Do not leave any items on the stairs.  Make sure that there are handrails on both sides of the stairs and use them. Fix handrails that are broken or loose. Make sure that handrails are as long as the stairways.  Check any carpeting to make sure that it is firmly attached to the stairs. Fix any carpet that is loose or worn.  Avoid having throw rugs at the top or bottom of the stairs. If you do have throw rugs, attach them to the floor with carpet tape.  Make sure that you have a light switch at the top of the stairs and the bottom of the stairs. If you do not have them, ask someone to add them for you. What else can I do to help prevent falls?  Wear shoes  that:  Do not have high heels.  Have rubber bottoms.  Are comfortable and fit you well.  Are closed at the toe. Do not wear sandals.  If you use a stepladder:  Make sure that it is fully opened. Do not climb a closed stepladder.  Make sure that both sides of the stepladder are locked into place.  Ask someone to hold it for you, if possible.  Clearly mark and make sure that you can see:  Any grab bars or handrails.  First and last steps.  Where the edge of each step is.  Use tools that help you move around (mobility aids) if they are needed. These include:  Canes.  Walkers.  Scooters.  Crutches.  Turn on the lights when you go into a dark area. Replace any light bulbs as soon as they burn out.  Set up your furniture so you have a clear path. Avoid moving your furniture around.  If any of your floors are uneven, fix them.  If there are any pets around you, be aware of where they are.  Review your medicines with your doctor. Some medicines can make you feel dizzy. This can increase your chance of falling. Ask your doctor what other things that you can do to help prevent falls. This information is not intended to replace advice given to you by your health care provider. Make sure you discuss any questions you have with your health care provider. Document Released: 03/08/2009 Document Revised: 10/18/2015 Document Reviewed: 06/16/2014 Elsevier Interactive Patient Education  2017 [employer].
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
## 2020-07-23 MED FILL — VENLAFAXINE HCL ER 75 MG CA: 75 | 90 days supply | Qty: 90 | Fill #2

## 2020-07-30 DIAGNOSIS — D2239 Melanocytic nevi of other parts of face: Secondary | ICD-10-CM | POA: Diagnosis not present

## 2020-07-30 DIAGNOSIS — D2261 Melanocytic nevi of right upper limb, including shoulder: Secondary | ICD-10-CM | POA: Diagnosis not present

## 2020-07-30 DIAGNOSIS — D2262 Melanocytic nevi of left upper limb, including shoulder: Secondary | ICD-10-CM | POA: Diagnosis not present

## 2020-07-30 DIAGNOSIS — D220 Melanocytic nevi of lip: Secondary | ICD-10-CM | POA: Diagnosis not present

## 2020-07-30 DIAGNOSIS — L738 Other specified follicular disorders: Secondary | ICD-10-CM | POA: Diagnosis not present

## 2020-07-30 DIAGNOSIS — L821 Other seborrheic keratosis: Secondary | ICD-10-CM | POA: Diagnosis not present

## 2020-07-30 DIAGNOSIS — D1801 Hemangioma of skin and subcutaneous tissue: Secondary | ICD-10-CM | POA: Diagnosis not present

## 2020-08-14 ENCOUNTER — Other Ambulatory Visit: Payer: Self-pay | Admitting: Internal Medicine

## 2020-08-14 MED FILL — buPROPion HCL ER (XL) 150 M: 150 | 90 days supply | Qty: 90 | Fill #0

## 2020-09-17 ENCOUNTER — Other Ambulatory Visit (HOSPITAL_COMMUNITY): Payer: Self-pay

## 2020-09-17 MED FILL — Venlafaxine HCl Cap ER 24HR 150 MG (Base Equivalent): ORAL | 90 days supply | Qty: 90 | Fill #0 | Status: AC

## 2020-10-18 ENCOUNTER — Other Ambulatory Visit (HOSPITAL_COMMUNITY): Payer: Self-pay

## 2020-10-18 MED FILL — Venlafaxine HCl Cap ER 24HR 75 MG (Base Equivalent): ORAL | 90 days supply | Qty: 90 | Fill #0 | Status: AC

## 2020-11-13 ENCOUNTER — Other Ambulatory Visit (HOSPITAL_COMMUNITY): Payer: Self-pay

## 2020-11-13 MED FILL — Bupropion HCl Tab ER 24HR 150 MG: ORAL | 90 days supply | Qty: 90 | Fill #0 | Status: AC

## 2020-12-10 MED FILL — Venlafaxine HCl Cap ER 24HR 150 MG (Base Equivalent): ORAL | 90 days supply | Qty: 90 | Fill #1 | Status: CN

## 2020-12-11 ENCOUNTER — Other Ambulatory Visit (HOSPITAL_COMMUNITY): Payer: Self-pay

## 2020-12-11 MED FILL — Venlafaxine HCl Cap ER 24HR 150 MG (Base Equivalent): ORAL | 90 days supply | Qty: 90 | Fill #1 | Status: AC

## 2020-12-24 ENCOUNTER — Encounter: Payer: PPO | Admitting: Internal Medicine

## 2021-01-16 ENCOUNTER — Other Ambulatory Visit: Payer: Self-pay | Admitting: Internal Medicine

## 2021-01-16 ENCOUNTER — Other Ambulatory Visit (HOSPITAL_COMMUNITY): Payer: Self-pay

## 2021-01-16 DIAGNOSIS — F3342 Major depressive disorder, recurrent, in full remission: Secondary | ICD-10-CM

## 2021-01-16 MED ORDER — VENLAFAXINE HCL ER 75 MG PO CP24
75.0000 mg | ORAL_CAPSULE | Freq: Every day | ORAL | 0 refills | Status: DC
  Filled 2021-01-16: qty 30, 30d supply, fill #0

## 2021-01-16 NOTE — Telephone Encounter (Signed)
  Notes to clinic:  Patient has appt on 01/24/21 Review for refill  Due for labs and office visit    Requested Prescriptions  Pending Prescriptions Disp Refills   venlafaxine XR (EFFEXOR-XR) 75 MG 24 hr capsule 90 capsule 3    Sig: TAKE 1 CAPSULE BY MOUTH DAILY     Psychiatry: Antidepressants - SNRI - desvenlafaxine & venlafaxine Failed - 01/16/2021 12:59 PM      Failed - LDL in normal range and within 360 days    LDL Chol Calc (NIH)  Date Value Ref Range Status  12/21/2019 152 (H) 0 - 99 mg/dL Final          Failed - Total Cholesterol in normal range and within 360 days    Cholesterol, Total  Date Value Ref Range Status  12/21/2019 246 (H) 100 - 199 mg/dL Final          Failed - Triglycerides in normal range and within 360 days    Triglycerides  Date Value Ref Range Status  12/21/2019 116 0 - 149 mg/dL Final          Failed - Valid encounter within last 6 months    Recent Outpatient Visits           1 year ago Annual physical exam   [place], Laura H, MD   1 year ago Hip pain, chronic, left   [facility] Glean Hess, MD   2 years ago Annual physical exam   [facility] Glean Hess, MD   3 years ago Annual physical exam   [facility] Glean Hess, MD   3 years ago Depression, major, recurrent, mild [facility])   [facility] Glean Hess, MD       Future Appointments             In 1 week Glean Hess, MD [facility] [place] - Completed PHQ-2 or PHQ-9 in the last 360 days      Passed - Last BP in normal range    BP Readings from Last 1 Encounters:  12/21/19 114/70

## 2021-01-24 ENCOUNTER — Other Ambulatory Visit: Payer: Self-pay

## 2021-01-24 ENCOUNTER — Encounter: Payer: Self-pay | Admitting: Internal Medicine

## 2021-01-24 ENCOUNTER — Other Ambulatory Visit (HOSPITAL_COMMUNITY): Payer: Self-pay

## 2021-01-24 ENCOUNTER — Ambulatory Visit (INDEPENDENT_AMBULATORY_CARE_PROVIDER_SITE_OTHER): Payer: PPO | Admitting: Internal Medicine

## 2021-01-24 VITALS — BP 118/78 | HR 82 | Temp 98.3°F | Ht 64.0 in | Wt 134.0 lb

## 2021-01-24 DIAGNOSIS — Z23 Encounter for immunization: Secondary | ICD-10-CM | POA: Diagnosis not present

## 2021-01-24 DIAGNOSIS — E781 Pure hyperglyceridemia: Secondary | ICD-10-CM | POA: Diagnosis not present

## 2021-01-24 DIAGNOSIS — Z1231 Encounter for screening mammogram for malignant neoplasm of breast: Secondary | ICD-10-CM

## 2021-01-24 DIAGNOSIS — M8580 Other specified disorders of bone density and structure, unspecified site: Secondary | ICD-10-CM

## 2021-01-24 DIAGNOSIS — F3342 Major depressive disorder, recurrent, in full remission: Secondary | ICD-10-CM

## 2021-01-24 DIAGNOSIS — Z0000 Encounter for general adult medical examination without abnormal findings: Secondary | ICD-10-CM

## 2021-01-24 MED ORDER — SHINGRIX 50 MCG/0.5ML IM SUSR: 0.5000 mL | Freq: Once | INTRAMUSCULAR | 1 refills | Status: AC

## 2021-01-24 MED ORDER — VENLAFAXINE HCL ER 150 MG PO CP24
150.0000 mg | ORAL_CAPSULE | Freq: Every day | ORAL | 3 refills | Status: DC
  Filled 2021-01-24: qty 90, fill #0
  Filled 2021-03-18: qty 90, 90d supply, fill #0
  Filled 2021-06-17: qty 90, 90d supply, fill #1
  Filled 2021-09-13: qty 90, 90d supply, fill #2
  Filled 2021-12-16: qty 90, 90d supply, fill #3

## 2021-01-24 MED ORDER — VENLAFAXINE HCL ER 75 MG PO CP24
75.0000 mg | ORAL_CAPSULE | Freq: Every day | ORAL | 3 refills | Status: DC
  Filled 2021-01-24 – 2021-02-18 (×2): qty 90, 90d supply, fill #0
  Filled 2021-05-20: qty 90, 90d supply, fill #1
  Filled 2021-08-19: qty 90, 90d supply, fill #2
  Filled 2021-11-18: qty 90, 90d supply, fill #3

## 2021-01-24 NOTE — Progress Notes (Signed)
Date:  01/24/2021   Name:  Samantha Barton   DOB:  1951/03/27   MRN:  [id number]   Chief Complaint: Annual Exam (Breast exam no pap) and Flu Vaccine Samantha Barton is a 70 y.o. female who presents today for her Complete Annual Exam. She feels well. She reports exercising x5 days a week and horse back riding and walking 7 day a week. She reports she is sleeping well. Breast complaints none.  Mammogram: 03/2020 DEXA: 02/2019 Osteopenia Pap smear: discontinued Colonoscopy: Cologuard 05/2019  Immunization History  Administered Date(s) Administered   Influenza, High Dose Seasonal PF 03/17/2018, 02/07/2019, 03/04/2020   Influenza,inj,Quad PF,6+ Mos 02/19/2017   Influenza-Unspecified 02/08/2016, 02/07/2019   PFIZER(Purple Top)SARS-COV-2 Vaccination 07/04/2019, 08/01/2019, 04/08/2020   Pneumococcal Conjugate-13 03/12/2016   Pneumococcal Polysaccharide-23 05/27/2009, 12/15/2017   Tdap 05/27/2013   Zoster, Live 05/27/2012    Depression        This is a chronic problem.The problem is unchanged.  Associated symptoms include no fatigue and no headaches.  Past treatments include SNRIs - Serotonin and norepinephrine reuptake inhibitors (and bupropion).  Lab Results  Component Value Date   CREATININE 1.03 (H) 12/21/2019   BUN 21 12/21/2019   NA 141 12/21/2019   K 5.0 12/21/2019   CL 102 12/21/2019   CO2 23 12/21/2019   Lab Results  Component Value Date   CHOL 246 (H) 12/21/2019   HDL 74 12/21/2019   LDLCALC 152 (H) 12/21/2019   TRIG 116 12/21/2019   CHOLHDL 3.3 12/21/2019   Lab Results  Component Value Date   TSH 1.980 12/21/2019   No results found for: HGBA1C Lab Results  Component Value Date   WBC 6.1 12/21/2019   HGB 13.1 12/21/2019   HCT 37.8 12/21/2019   MCV 96 12/21/2019   PLT 281 12/21/2019   Lab Results  Component Value Date   ALT 16 12/21/2019   AST 29 12/21/2019   ALKPHOS 103 12/21/2019   BILITOT 0.4 12/21/2019  Last vitamin D Lab Results  Component  Value Date   VD25OH 35.6 12/21/2019     Review of Systems  Constitutional:  Negative for chills, fatigue and fever.  HENT:  Negative for congestion, hearing loss, tinnitus, trouble swallowing and voice change.   Eyes:  Negative for visual disturbance.  Respiratory:  Negative for cough, chest tightness, shortness of breath and wheezing.   Cardiovascular:  Negative for chest pain, palpitations and leg swelling.  Gastrointestinal:  Negative for abdominal pain, constipation, diarrhea and vomiting.  Endocrine: Negative for polydipsia and polyuria.  Genitourinary:  Negative for dysuria, frequency, genital sores, vaginal bleeding and vaginal discharge.  Musculoskeletal:  Negative for arthralgias, gait problem and joint swelling.  Skin:  Negative for color change and rash.  Neurological:  Negative for dizziness, tremors, light-headedness and headaches.  Hematological:  Negative for adenopathy. Does not bruise/bleed easily.  Psychiatric/Behavioral:  Positive for depression. Negative for dysphoric mood and sleep disturbance. The patient is not nervous/anxious.    Patient Active Problem List   Diagnosis Date Noted   Shortness of breath 12/16/2018   Recurrent major depressive disorder, in full remission ([place]) 06/24/2018   Osteopenia determined by x-ray 03/19/2016   Hypertriglyceridemia 01/14/2015    No Known Allergies  Past Surgical History:  Procedure Laterality Date   BREAST BIOPSY Right 1998   bx/clip-neg   FRACTURE SURGERY  2017   JOINT REPLACEMENT  08/10/2019   ORBITAL FRACTURE SURGERY  04/25/2015   ORIF ORBITAL FRACTURE Right 04/25/2015  Procedure: OPEN REDUCTION INTERNAL FIXATION (ORIF) ORBITAL FRACTURE;  Surgeon: Clyde Canterbury, MD;  Location: [facility] ORS;  Service: ENT;  Laterality: Right;   SIGMOIDOSCOPY     TOTAL HIP ARTHROPLASTY Left 08/10/2019    Social History   Tobacco Use   Smoking status: Never   Smokeless tobacco: Never   Tobacco comments:    smoking cessation  materials not required  Vaping Use   Vaping Use: Never used  Substance Use Topics   Alcohol use: No    Alcohol/week: 0.0 standard drinks   Drug use: No     Medication list has been reviewed and updated.  Current Meds  Medication Sig   buPROPion (WELLBUTRIN XL) 150 MG 24 hr tablet TAKE 1 TABLET BY MOUTH DAILY.   tretinoin (RETIN-A) 0.05 % cream RETIN-A, 0.05% (External Cream) - Historical Medication  (0.05 %) Active   venlafaxine XR (EFFEXOR-XR) 150 MG 24 hr capsule TAKE 1 CAPSULE (150 MG TOTAL) BY MOUTH DAILY.   venlafaxine XR (EFFEXOR-XR) 75 MG 24 hr capsule Take 1 capsule (75 mg total) by mouth daily.   [DISCONTINUED] Calcium Carbonate (CALCIUM 500 PO) Take by mouth.   [DISCONTINUED] cholecalciferol (VITAMIN D) 1000 units tablet Take 1,000 Units by mouth daily.    PHQ 2/9 Scores 01/24/2021 07/16/2020 12/21/2019 06/13/2019  PHQ - 2 Score 0 2 0 0  PHQ- 9 Score 1 2 0 3    GAD 7 : Generalized Anxiety Score 01/24/2021 12/21/2019  Nervous, Anxious, on Edge 0 0  Control/stop worrying 0 0  Worry too much - different things 0 0  Trouble relaxing 0 0  Restless 0 0  Easily annoyed or irritable 0 0  Afraid - awful might happen 0 0  Total GAD 7 Score 0 0  Anxiety Difficulty - Not difficult at all    BP Readings from Last 3 Encounters:  01/24/21 118/78  12/21/19 114/70  06/13/19 134/76    Physical Exam Vitals and nursing note reviewed.  Constitutional:      General: She is not in acute distress.    Appearance: She is well-developed.  HENT:     Head: Normocephalic and atraumatic.     Right Ear: Tympanic membrane and ear canal normal.     Left Ear: Tympanic membrane and ear canal normal.     Nose:     Right Sinus: No maxillary sinus tenderness.     Left Sinus: No maxillary sinus tenderness.  Eyes:     General: No scleral icterus.       Right eye: No discharge.        Left eye: No discharge.     Conjunctiva/sclera: Conjunctivae normal.  Neck:     Thyroid: No thyromegaly.      Vascular: No carotid bruit.  Cardiovascular:     Rate and Rhythm: Normal rate and regular rhythm.     Pulses: Normal pulses.     Heart sounds: Normal heart sounds.  Pulmonary:     Effort: Pulmonary effort is normal. No respiratory distress.     Breath sounds: No wheezing.  Chest:  Breasts:    Right: No mass, nipple discharge, skin change or tenderness.     Left: No mass, nipple discharge, skin change or tenderness.  Abdominal:     General: Bowel sounds are normal.     Palpations: Abdomen is soft.     Tenderness: There is no abdominal tenderness.  Musculoskeletal:     Cervical back: Normal range of motion. No erythema.  Right lower leg: No edema.     Left lower leg: No edema.  Lymphadenopathy:     Cervical: No cervical adenopathy.  Skin:    General: Skin is warm and dry.     Findings: No rash.  Neurological:     Mental Status: She is alert and oriented to person, place, and time.     Cranial Nerves: No cranial nerve deficit.     Sensory: No sensory deficit.     Deep Tendon Reflexes: Reflexes are normal and symmetric.  Psychiatric:        Attention and Perception: Attention normal.        Mood and Affect: Mood normal.    Wt Readings from Last 3 Encounters:  01/24/21 134 lb (60.8 kg)  12/21/19 136 lb (61.7 kg)  06/13/19 133 lb (60.3 kg)    BP 118/78   Pulse 82   Temp 98.3 F (36.8 C) (Oral)   Ht '5\' 4"'$  (1.626 m)   Wt 134 lb (60.8 kg)   SpO2 98%   BMI 23.00 kg/m   Assessment and Plan: 1. Annual physical exam Normal exam. Up to date on screenings and immunizations. - CBC with Differential/Platelet - Comprehensive metabolic panel  2. Encounter for screening mammogram for breast cancer [facility] MM 3D SCREEN BREAST BILATERAL  3. Recurrent major depressive disorder, in full remission ([place]) Clinically stable on current regimen with good control of symptoms, No SI or HI. Will continue current therapy. - TSH - venlafaxine XR (EFFEXOR-XR)  75 MG 24 hr capsule; Take 1 capsule (75 mg total) by mouth daily.  Dispense: 90 capsule; Refill: 3 - venlafaxine XR (EFFEXOR-XR) 150 MG 24 hr capsule; TAKE 1 CAPSULE (150 MG TOTAL) BY MOUTH DAILY.  Dispense: 90 capsule; Refill: 3  4. Hypertriglyceridemia Will check labs and advise Continue to work on diet and exercise - Lipid panel  5. Osteopenia determined by x-ray Recommend resuming Calcium and vitamin D. Last vitamin D level was normal. Repeat DEXA in one year.   Partially dictated using Editor, commissioning. Any errors are unintentional.  Halina Maidens, MD [facility]  01/24/2021

## 2021-01-25 LAB — TSH: TSH: 1.45 u[IU]/mL (ref 0.450–4.500)

## 2021-01-25 LAB — LIPID PANEL
Chol/HDL Ratio: 3 ratio (ref 0.0–4.4)
Cholesterol, Total: 255 mg/dL — ABNORMAL HIGH (ref 100–199)
HDL: 84 mg/dL (ref >39)
LDL Chol Calc (NIH): 142 mg/dL — ABNORMAL HIGH (ref 0–99)
Triglycerides: 168 mg/dL — ABNORMAL HIGH (ref 0–149)
VLDL Cholesterol Cal: 29 mg/dL (ref 5–40)

## 2021-01-25 LAB — COMPREHENSIVE METABOLIC PANEL
ALT: 13 IU/L (ref 0–32)
AST: 23 IU/L (ref 0–40)
Albumin/Globulin Ratio: 1.8 (ref 1.2–2.2)
Albumin: 4.7 g/dL (ref 3.8–4.8)
Alkaline Phosphatase: 108 IU/L (ref 44–121)
BUN/Creatinine Ratio: 25 (ref 12–28)
BUN: 24 mg/dL (ref 8–27)
Bilirubin Total: 0.3 mg/dL (ref 0.0–1.2)
CO2: 26 mmol/L (ref 20–29)
Calcium: 10.5 mg/dL — ABNORMAL HIGH (ref 8.7–10.3)
Chloride: 101 mmol/L (ref 96–106)
Creatinine, Ser: 0.97 mg/dL (ref 0.57–1.00)
Globulin, Total: 2.6 g/dL (ref 1.5–4.5)
Glucose: 75 mg/dL (ref 65–99)
Potassium: 4.3 mmol/L (ref 3.5–5.2)
Sodium: 141 mmol/L (ref 134–144)
Total Protein: 7.3 g/dL (ref 6.0–8.5)
eGFR: 63 mL/min/{1.73_m2} (ref >59)

## 2021-01-25 LAB — CBC WITH DIFFERENTIAL/PLATELET
Basophils Absolute: 0.1 10*3/uL (ref 0.0–0.2)
Basos: 1 %
EOS (ABSOLUTE): 0.1 10*3/uL (ref 0.0–0.4)
Eos: 2 %
Hematocrit: 38.4 % (ref 34.0–46.6)
Hemoglobin: 13.7 g/dL (ref 11.1–15.9)
Immature Grans (Abs): 0 10*3/uL (ref 0.0–0.1)
Immature Granulocytes: 0 %
Lymphocytes Absolute: 2 10*3/uL (ref 0.7–3.1)
Lymphs: 35 %
MCH: 34.7 pg — ABNORMAL HIGH (ref 26.6–33.0)
MCHC: 35.7 g/dL (ref 31.5–35.7)
MCV: 97 fL (ref 79–97)
Monocytes Absolute: 0.5 10*3/uL (ref 0.1–0.9)
Monocytes: 8 %
Neutrophils Absolute: 3.2 10*3/uL (ref 1.4–7.0)
Neutrophils: 54 %
Platelets: 313 10*3/uL (ref 150–450)
RBC: 3.95 x10E6/uL (ref 3.77–5.28)
RDW: 11.9 % (ref 11.7–15.4)
WBC: 5.9 10*3/uL (ref 3.4–10.8)

## 2021-01-29 NOTE — Progress Notes (Signed)
Called pt left VM to call back. ? ?PEC nurse may give results to patient if they return call to clinic, a CRM has been created. ? ?KP

## 2021-02-16 MED FILL — Bupropion HCl Tab ER 24HR 150 MG: ORAL | 90 days supply | Qty: 90 | Fill #1 | Status: AC

## 2021-02-18 ENCOUNTER — Other Ambulatory Visit (HOSPITAL_COMMUNITY): Payer: Self-pay

## 2021-03-18 ENCOUNTER — Other Ambulatory Visit (HOSPITAL_COMMUNITY): Payer: Self-pay

## 2021-03-19 ENCOUNTER — Other Ambulatory Visit: Payer: Self-pay | Admitting: Internal Medicine

## 2021-03-19 DIAGNOSIS — Z1231 Encounter for screening mammogram for malignant neoplasm of breast: Secondary | ICD-10-CM

## 2021-04-02 ENCOUNTER — Other Ambulatory Visit: Payer: Self-pay

## 2021-04-02 ENCOUNTER — Encounter: Payer: Self-pay | Admitting: Internal Medicine

## 2021-04-02 DIAGNOSIS — F339 Major depressive disorder, recurrent, unspecified: Secondary | ICD-10-CM

## 2021-04-11 ENCOUNTER — Telehealth: Payer: Self-pay | Admitting: Psychiatry
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
## 2021-04-16 ENCOUNTER — Other Ambulatory Visit: Payer: Self-pay

## 2021-04-16 ENCOUNTER — Telehealth (INDEPENDENT_AMBULATORY_CARE_PROVIDER_SITE_OTHER): Payer: PPO | Admitting: Psychiatry

## 2021-04-16 ENCOUNTER — Encounter: Payer: Self-pay | Admitting: Psychiatry

## 2021-04-16 ENCOUNTER — Telehealth: Payer: Self-pay

## 2021-04-16 ENCOUNTER — Other Ambulatory Visit (HOSPITAL_COMMUNITY): Payer: Self-pay

## 2021-04-16 DIAGNOSIS — F331 Major depressive disorder, recurrent, moderate: Secondary | ICD-10-CM

## 2021-04-16 MED ORDER — BUPROPION HCL ER (XL) 300 MG PO TB24
300.0000 mg | ORAL_TABLET | Freq: Every day | ORAL | 1 refills | Status: DC
  Filled 2021-04-16: qty 30, 30d supply, fill #0
  Filled 2021-06-02: qty 30, 30d supply, fill #1

## 2021-04-16 NOTE — Patient Instructions (Signed)
Continue Effexor 225 mg daily   Increase bupropion 300 mg daily Next appointment- 1/9 at 8:30

## 2021-04-16 NOTE — Telephone Encounter (Signed)
per Dr. Modesta Messing could you contact [facility], and ancel bupropion 150 mg order from [address] [facility], [place] ? new order of 300 mg was sent in, pharamcy notified.

## 2021-04-22 ENCOUNTER — Other Ambulatory Visit: Payer: Self-pay

## 2021-04-22 ENCOUNTER — Ambulatory Visit
Admission: RE | Admit: 2021-04-22 | Discharge: 2021-04-22 | Disposition: A | Discharge status: Home / Self Care | Payer: PPO | Source: Ambulatory Visit

## 2021-04-22 DIAGNOSIS — Z1231 Encounter for screening mammogram for malignant neoplasm of breast: Secondary | ICD-10-CM

## 2021-05-21 ENCOUNTER — Other Ambulatory Visit (HOSPITAL_COMMUNITY): Payer: Self-pay

## 2021-05-30 NOTE — Progress Notes (Signed)
Virtual Visit via Video Note  I connected with Samantha Barton on 06/03/21 at  8:30 AM EST by a video enabled telemedicine application and verified that I am speaking with the correct person using two identifiers.  Location: Patient: home Provider: office Persons participated in the visit- patient, provider    I discussed the limitations of evaluation and management by telemedicine and the availability of in person appointments. The patient expressed understanding and agreed to proceed.   I discussed the assessment and treatment plan with the patient. The patient was provided an opportunity to ask questions and all were answered. The patient agreed with the plan and demonstrated an understanding of the instructions.   The patient was advised to call back or seek an in-person evaluation if the symptoms worsen or if the condition fails to improve as anticipated.  I provided 20 minutes of non-face-to-face time during this encounter.   Norman Clay, MD    [facility] MD/PA/NP OP Progress Note  06/03/2021 9:09 AM Samantha Barton  MRN:  [id number]  Chief Complaint:  Chief Complaint   Depression; Follow-up    HPI:  This is a follow-up appointment for depression.  She states that she has not noticed much difference since starting bupropion.  She continues to feel sad in the morning.  However, it does not last so long.  She wonders if this is situational given the things going on in the world.  She also talks about her parents, who are in the 77s.  They live in [place].  Although they do have frequent fall and contact the patient, he did not want home health nor any help.  She feels hopeless about the situation.  She and her sister in [place] will visit them.  She also agrees to try having an appointment with PCP with them. She states that she wishes not to live long as her parents unless she has somebody to take care of her.  She is not interested in going back to [place] as she would not be able  to afford it. She has been trying to have social connection at least once a week.  She does horseback riding every day.  She denies most of the depressive symptoms as in PHQ-9.  She denies SI.  She denies anxiety.  She feels comfortable to stay on the medication as it is while trying therapy.  She wishes to see a therapist who is specialize in geriatric population.    Visit Diagnosis:    ICD-10-CM   1. Recurrent major depressive disorder, in partial remission ([place])  F33.41       Past Psychiatric History: Please see initial evaluation for full details. I have reviewed the history. No updates at this time.     Past Medical History:  Past Medical History:  Diagnosis Date   Arthritis    Depression    Depression, major, recurrent, mild ([place]) 01/14/2015   Fracture, orbital ([place]) 06/2015   Right Eye -Horse ACCIDENT   Hand fracture, right 06/2015   Horse Accident    Hypertriglyceridemia     Past Surgical History:  Procedure Laterality Date   BREAST BIOPSY Right 1998   bx/clip-neg   FRACTURE SURGERY  2017   JOINT REPLACEMENT  08/10/2019   ORBITAL FRACTURE SURGERY  04/25/2015   ORIF ORBITAL FRACTURE Right 04/25/2015   Procedure: OPEN REDUCTION INTERNAL FIXATION (ORIF) ORBITAL FRACTURE;  Surgeon: Clyde Canterbury, MD;  Location: [facility] ORS;  Service: ENT;  Laterality: Right;  SIGMOIDOSCOPY     TOTAL HIP ARTHROPLASTY Left 08/10/2019    Family Psychiatric History: Please see initial evaluation for full details. I have reviewed the history. No updates at this time.     Family History:  Family History  Problem Relation Age of Onset   Diabetes Father    Healthy Mother    Breast cancer Neg Hx     Social History:  Social History   Socioeconomic History   Marital status: Single    Spouse name: Not on file   Number of children: 0   Years of education: Not on file   Highest education level: Master's degree (e.g., MA, MS, MEng, MEd, MSW, MBA)  Occupational History   Occupation: Retired   Tobacco Use   Smoking status: Never   Smokeless tobacco: Never   Tobacco comments:    smoking cessation materials not required  Vaping Use   Vaping Use: Never used  Substance and Sexual Activity   Alcohol use: No    Alcohol/week: 0.0 standard drinks   Drug use: No   Sexual activity: Not Currently    Birth control/protection: Post-menopausal  Other Topics Concern   Not on file  Social History Narrative   Not on file   Social Determinants of Health   Financial Resource Strain: Low Risk    Difficulty of Paying Living Expenses: Not hard at all  Food Insecurity: No Food Insecurity   Worried About Charity fundraiser in the Last Year: Never true   [place] in the Last Year: Never true  Transportation Needs: No Transportation Needs   Lack of Transportation (Medical): No   Lack of Transportation (Non-Medical): No  Physical Activity: Sufficiently Active   Days of Exercise per Week: 4 days   Minutes of Exercise per Session: 60 min  Stress: No Stress Concern Present   Feeling of Stress : Only a little  Social Connections: Socially Isolated   Frequency of Communication with Friends and Family: Once a week   Frequency of Social Gatherings with Friends and Family: Once a week   Attends Religious Services: Never   Marine scientist or Organizations: No   Attends Music therapist: Never   Marital Status: Divorced    Allergies: No Known Allergies  Metabolic Disorder Labs: No results found for: HGBA1C, MPG No results found for: PROLACTIN Lab Results  Component Value Date   CHOL 255 (H) 01/24/2021   TRIG 168 (H) 01/24/2021   HDL 84 01/24/2021   CHOLHDL 3.0 01/24/2021   LDLCALC 142 (H) 01/24/2021   LDLCALC 152 (H) 12/21/2019   Lab Results  Component Value Date   TSH 1.450 01/24/2021   TSH 1.980 12/21/2019    Therapeutic Level Labs: No results found for: LITHIUM No results found for: VALPROATE No components found for:  CBMZ  Current  Medications: Current Outpatient Medications  Medication Sig Dispense Refill   [START ON 07/04/2021] buPROPion (WELLBUTRIN XL) 300 MG 24 hr tablet Take 1 tablet (300 mg total) by mouth daily. 90 tablet 0   tretinoin (RETIN-A) 0.05 % cream RETIN-A, 0.05% (External Cream) - Historical Medication  (0.05 %) Active     venlafaxine XR (EFFEXOR-XR) 150 MG 24 hr capsule Take 1 capsule (150 mg total) by mouth daily. 90 capsule 3   venlafaxine XR (EFFEXOR-XR) 75 MG 24 hr capsule Take 1 capsule (75 mg total) by mouth daily. 90 capsule 3   No current facility-administered medications for this visit.  Musculoskeletal: Strength & Muscle Tone:  N/A Gait & Station:  N/A Patient leans: N/A  Psychiatric Specialty Exam: Review of Systems  Psychiatric/Behavioral:  Positive for dysphoric mood. Negative for agitation, behavioral problems, confusion, decreased concentration, hallucinations, self-injury, sleep disturbance and suicidal ideas. The patient is not nervous/anxious and is not hyperactive.   All other systems reviewed and are negative.  There were no vitals taken for this visit.There is no height or weight on file to calculate BMI.  General Appearance: Fairly Groomed  Eye Contact:  Good  Speech:  Clear and Coherent  Volume:  Normal  Mood:   sad  Affect:  Appropriate, Congruent, and euthymic, down at times  Thought Process:  Coherent  Orientation:  Full (Time, Place, and Person)  Thought Content: Logical   Suicidal Thoughts:  No  Homicidal Thoughts:  No  Memory:  Immediate;   Good  Judgement:  Good  Insight:  Good  Psychomotor Activity:  Normal  Concentration:  Concentration: Good and Attention Span: Good  Recall:  Good  Fund of Knowledge: Good  Language: Good  Akathisia:  No  Handed:  Right  AIMS (if indicated): not done  Assets:  Communication Skills Desire for Improvement  ADL's:  Intact  Cognition: WNL  Sleep:  Good   Screenings: GAD-7    Flowsheet Row Office Visit from  01/24/2021 in [facility] Office Visit from 12/21/2019 in [facility]  Total GAD-7 Score 0 0      PHQ2-9    Flowsheet Row Video Visit from 06/03/2021 in [facility] Video Visit from 04/16/2021 in [facility] Office Visit from 01/24/2021 in [facility] from 07/16/2020 in [facility] Office Visit from 12/21/2019 in [facility]  PHQ-2 Total Score 1 6 0 2 0  PHQ-9 Total Score 1 13 1 2  0        Assessment and Plan:  Laetitia Quisha Mabie is a 71 y.o. year old female with a history of depression, arthritis, who presents for follow up appointment for below.   1. Recurrent major depressive disorder, in partial remission ([place]) Although she continues to report sadness in the morning, there has been improvement in other depressive symptoms despite her subjective feeling of medication adjustment has made no difference.  Psychosocial stressors includes her parents in [place], and her own aging.  She is willing to engage in daily activities includes horseback riding.  It is likely that her sadness is more attributable to her current situation.  She agrees to first try therapy while continuing her medication.  Will continue venlafaxine and bupropion to target depression.  Will make referral for therapy.     Plan  Continue Effexor 225 mg daily   Continue bupropion 300 mg daily Next appointment- 4/3 at 8 AM for 30 mins, video [email or web address]  Past trials of medication: fluoxetine, citalopram, venlafaxine, bupropion, lithium    The patient demonstrates the following risk factors for suicide: Chronic risk factors for suicide include: psychiatric disorder of depression. Acute risk factors for suicide include: unemployment and social withdrawal/isolation. Protective factors for this patient include: coping skills and hope for the future. Considering these factors, the overall suicide risk at  this point appears to be low. Patient is appropriate for outpatient follow up.  Norman Clay, MD 06/03/2021, 9:09 AM

## 2021-06-03 ENCOUNTER — Encounter: Payer: Self-pay | Admitting: Psychiatry

## 2021-06-03 ENCOUNTER — Other Ambulatory Visit: Payer: Self-pay

## 2021-06-03 ENCOUNTER — Other Ambulatory Visit (HOSPITAL_COMMUNITY): Payer: Self-pay

## 2021-06-03 ENCOUNTER — Telehealth: Payer: PPO | Admitting: Psychiatry

## 2021-06-03 DIAGNOSIS — F3341 Major depressive disorder, recurrent, in partial remission: Secondary | ICD-10-CM | POA: Diagnosis not present

## 2021-06-03 MED ORDER — BUPROPION HCL ER (XL) 300 MG PO TB24
300.0000 mg | ORAL_TABLET | Freq: Every day | ORAL | 0 refills | Status: DC
  Filled 2021-06-03 – 2021-06-04 (×3): qty 90, 90d supply, fill #0

## 2021-06-03 NOTE — Patient Instructions (Signed)
Continue Effexor 225 mg daily   Continue bupropion 300 mg daily Next appointment- 4/3 at 8 AM

## 2021-06-04 ENCOUNTER — Other Ambulatory Visit (HOSPITAL_COMMUNITY): Payer: Self-pay

## 2021-06-17 ENCOUNTER — Other Ambulatory Visit (HOSPITAL_COMMUNITY): Payer: Self-pay

## 2021-07-11 ENCOUNTER — Ambulatory Visit: Payer: PPO | Admitting: Psychologist

## 2021-07-17 ENCOUNTER — Ambulatory Visit: Payer: PPO

## 2021-07-30 ENCOUNTER — Ambulatory Visit (INDEPENDENT_AMBULATORY_CARE_PROVIDER_SITE_OTHER): Payer: PPO | Admitting: Psychologist

## 2021-07-30 ENCOUNTER — Other Ambulatory Visit: Payer: Self-pay

## 2021-07-30 DIAGNOSIS — L814 Other melanin hyperpigmentation: Secondary | ICD-10-CM | POA: Diagnosis not present

## 2021-07-30 DIAGNOSIS — D224 Melanocytic nevi of scalp and neck: Secondary | ICD-10-CM | POA: Diagnosis not present

## 2021-07-30 DIAGNOSIS — D692 Other nonthrombocytopenic purpura: Secondary | ICD-10-CM | POA: Diagnosis not present

## 2021-07-30 DIAGNOSIS — L821 Other seborrheic keratosis: Secondary | ICD-10-CM | POA: Diagnosis not present

## 2021-07-30 DIAGNOSIS — L570 Actinic keratosis: Secondary | ICD-10-CM | POA: Diagnosis not present

## 2021-07-30 DIAGNOSIS — F330 Major depressive disorder, recurrent, mild: Secondary | ICD-10-CM | POA: Diagnosis not present

## 2021-07-30 DIAGNOSIS — D2262 Melanocytic nevi of left upper limb, including shoulder: Secondary | ICD-10-CM | POA: Diagnosis not present

## 2021-07-30 DIAGNOSIS — D225 Melanocytic nevi of trunk: Secondary | ICD-10-CM | POA: Diagnosis not present

## 2021-07-30 DIAGNOSIS — D2272 Melanocytic nevi of left lower limb, including hip: Secondary | ICD-10-CM | POA: Diagnosis not present

## 2021-07-30 NOTE — Progress Notes (Signed)
[place] Counselor Initial Adult Exam ? ?Name: Samantha Barton ?Date: 07/30/2021 ?MRN: [id number] ?DOB: [birth year] ?PCP: Glean Hess, MD ? ?Time spent: 10:03 am to 10:45 am; total time: 42 minutes ? ?This session was held via in person. The patient consented to in-person therapy and was in the clinician's office. Limits of confidentiality were discussed with the patient.  ? ?Guardian/Payee:  NA   ? ?Paperwork requested: No  ? ?Reason for Visit /Presenting Problem: Depression ? ?Mental Status Exam: ?Appearance:   Casual     ?Behavior:  Appropriate  ?Motor:  Normal  ?Speech/Language:   Clear and Coherent  ?Affect:  Appropriate  ?Mood:  normal  ?Thought process:  normal  ?Thought content:    WNL  ?Sensory/Perceptual disturbances:    WNL  ?Orientation:  oriented to person, place, and time/date  ?Attention:  Good  ?Concentration:  Good  ?Memory:  WNL  ?Fund of knowledge:   Good  ?Insight:    Good  ?Judgment:   Good  ?Impulse Control:  Good  ? ? ?Reported Symptoms:  The patient endorsed experiencing the following: feeling down, sad, lack of motivation, social isolation, avoiding pleasurable activities, rumination of negative thoughts, and low self-esteem. She denied suicidal and homicidal ideation.  ? ?Risk Assessment: ?Danger to Self:  No ?Self-injurious Behavior: No ?Danger to Others: No ?Duty to Warn:no ?Physical Aggression / Violence:No  ?Access to Firearms a concern: No  ?Gang Involvement:No  ?Patient / guardian was educated about steps to take if suicide or homicide risk level increases between visits: n/a ?While future psychiatric events cannot be accurately predicted, the patient does not currently require acute inpatient psychiatric care and does not currently meet [facility] involuntary commitment criteria. ? ?Substance Abuse History: ?Current substance abuse: No    ? ?Past Psychiatric History:   ?Previous psychological history is significant for depression ?Outpatient  Providers:NA ?History of Psych Hospitalization: No  ?Psychological Testing:  NA   ? ?Abuse History:  ?Victim of: No.,  NA    ?Report needed: No. ?Victim of Neglect:No. ?Perpetrator of  NA   ?Witness / Exposure to Domestic Violence: No   ?Protective Services Involvement: No  ?Witness to [employer] Violence:  No  ? ?Family History:  ?Family History  ?Problem Relation Age of Onset  ? Diabetes Father   ? Healthy Mother   ? Breast cancer Neg Hx   ? ? ?Living situation: the patient lives alone ? ?Sexual Orientation: Straight ? ?Relationship Status: divorced  ?Name of spouse / other:NA ?If a parent, number of children / ages:NA ? ?Support Systems: Denied ? ?Financial Stress:  No  ? ?Income/Employment/Disability: Social Security Retirement ? ?Military Service: No  ? ?Educational History: ?Education: post Forensic psychologist work or degree ? ?Religion/Sprituality/World View: ?Atheist ? ?Any cultural differences that may affect / interfere with treatment:  not applicable  ? ?Recreation/Hobbies: Equestrian  ? ?Stressors: Other: [place] 70 and concerns about next phase of life   ? ?Strengths: Able to Communicate Effectively ? ?Barriers:  Social isolation  ? ?Legal History: ?Pending legal issue / charges: The patient has no significant history of legal issues. ?History of legal issue / charges:  NA ? ?Medical History/Surgical History: reviewed ?Past Medical History:  ?Diagnosis Date  ? Arthritis   ? Depression   ? Depression, major, recurrent, mild ([place]) 01/14/2015  ? Fracture, orbital ([place]) 06/2015  ? Right Eye -Horse ACCIDENT  ? Hand fracture, right 06/2015  ? Horse Accident   ? Hypertriglyceridemia   ? ? ?  Past Surgical History:  ?Procedure Laterality Date  ? BREAST BIOPSY Right 1998  ? bx/clip-neg  ? FRACTURE SURGERY  2017  ? JOINT REPLACEMENT  08/10/2019  ? ORBITAL FRACTURE SURGERY  04/25/2015  ? ORIF ORBITAL FRACTURE Right 04/25/2015  ? Procedure: OPEN REDUCTION INTERNAL FIXATION (ORIF) ORBITAL FRACTURE;  Surgeon: Clyde Canterbury,  MD;  Location: [facility] ORS;  Service: ENT;  Laterality: Right;  ? SIGMOIDOSCOPY    ? TOTAL HIP ARTHROPLASTY Left 08/10/2019  ? ? ?Medications: ?Current Outpatient Medications  ?Medication Sig Dispense Refill  ? buPROPion (WELLBUTRIN XL) 300 MG 24 hr tablet Take 1 tablet (300 mg total) by mouth daily. 90 tablet 0  ? tretinoin (RETIN-A) 0.05 % cream RETIN-A, 0.05% (External Cream) - Historical Medication ? (0.05 %) ?Active    ? venlafaxine XR (EFFEXOR-XR) 150 MG 24 hr capsule Take 1 capsule (150 mg total) by mouth daily. 90 capsule 3  ? venlafaxine XR (EFFEXOR-XR) 75 MG 24 hr capsule Take 1 capsule (75 mg total) by mouth daily. 90 capsule 3  ? ?No current facility-administered medications for this visit.  ? ? ?No Known Allergies ? ?Diagnoses:  ?F33.0 major depressive affective disorder, recurrent, mild ? ?Plan of Care: The patient is a 71 year old Caucasian female who was referred due to experiencing depression. The patient lives with her dogs and cat. The patient meets criteria for a diagnosis of F33.0 major depressive affective disorder, recurrent, mild based off of the following: feeling down, sad, lack of motivation, social isolation, avoiding pleasurable activities, rumination of negative thoughts, and low self-esteem. She denied suicidal and homicidal ideation.  ? ?The patient stated that she wanted a place to process her thoughts. ? ?This psychologist makes the recommendation that the patient participate in therapy at least once a month.  ? ? ?Conception Chancy, PsyD  ? ? ? ?

## 2021-07-30 NOTE — Plan of Care (Signed)

## 2021-07-30 NOTE — Progress Notes (Signed)
                Lyonel Morejon, PsyD 

## 2021-08-01 ENCOUNTER — Telehealth: Payer: Self-pay

## 2021-08-01 NOTE — Telephone Encounter (Signed)
Could you ask her what her difficulty is. Thanks

## 2021-08-01 NOTE — Telephone Encounter (Signed)
pt left a message that she is having difficulty with her medication and would like for you to call her. ?

## 2021-08-07 NOTE — Progress Notes (Signed)
Virtual Visit via Video Note ? ?I connected with Samantha Barton on 08/09/21 at  9:30 AM EDT by a video enabled telemedicine application and verified that I am speaking with the correct person using two identifiers. ? ?Location: ?Patient: home ?Provider: office ?Persons participated in the visit- patient, provider  ?  ?I discussed the limitations of evaluation and management by telemedicine and the availability of in person appointments. The patient expressed understanding and agreed to proceed. ? ?  ?I discussed the assessment and treatment plan with the patient. The patient was provided an opportunity to ask questions and all were answered. The patient agreed with the plan and demonstrated an understanding of the instructions. ?  ?The patient was advised to call back or seek an in-person evaluation if the symptoms worsen or if the condition fails to improve as anticipated. ? ?I provided 18 minutes of non-face-to-face time during this encounter. ? ? ?Norman Clay, MD ? ? ? ?BH MD/PA/NP OP Progress Note ? ?08/09/2021 10:00 AM ?Samantha Barton  ?MRN:  [id number] ? ?Chief Complaint:  ?Chief Complaint  ?Patient presents with  ? Follow-up  ? Depression  ? ?HPI:  ?This is a follow-up appointment for depression.  ?She states that she made this appointment sooner as she had some bad days.  She wonders if bupropion is making her feel worse. She states that she has some days, which she tends to be distracted and cannot make decision.  She takes a shower twice a week, although she used to take it every day.  She talks about an episode of her leaving keys in the front door a few times.  She also lost money, and found that in driveway.  She feels discouraged by these episodes.  She tends to be fragile, and has crying spells at times.  She has been communicating with her mother in [place].  Although she found a good place/assistant living close to her place, her mother declined that as it is in [place].  Although she was  told by her mother that not taking it personally, she feels frustrated with the situation.  She has started to see a therapist.  She is willing to try another visit.  There are some days, she has been productive.  She enjoys horseback riding.  She has good concentration on those days; she enjoyed reading yesterday.  She has initial and middle insomnia.  She denies SI.  She denies hallucinations or paranoia.  She denies alcohol use or drug use. She agrees to try higher dose of bupropion at this time.  ? ?Visit Diagnosis:  ?  ICD-10-CM   ?1. Mild episode of recurrent major depressive disorder ([place])  F33.0   ?  ? ? ?Past Psychiatric History: Please see initial evaluation for full details. I have reviewed the history. No updates at this time.  ?  ? ?Past Medical History:  ?Past Medical History:  ?Diagnosis Date  ? Arthritis   ? Depression   ? Depression, major, recurrent, mild ([place]) 01/14/2015  ? Fracture, orbital ([place]) 06/2015  ? Right Eye -Horse ACCIDENT  ? Hand fracture, right 06/2015  ? Horse Accident   ? Hypertriglyceridemia   ?  ?Past Surgical History:  ?Procedure Laterality Date  ? BREAST BIOPSY Right 1998  ? bx/clip-neg  ? FRACTURE SURGERY  2017  ? JOINT REPLACEMENT  08/10/2019  ? ORBITAL FRACTURE SURGERY  04/25/2015  ? ORIF ORBITAL FRACTURE Right 04/25/2015  ? Procedure: OPEN REDUCTION INTERNAL FIXATION (ORIF) ORBITAL  FRACTURE;  Surgeon: Clyde Canterbury, MD;  Location: [facility] ORS;  Service: ENT;  Laterality: Right;  ? SIGMOIDOSCOPY    ? TOTAL HIP ARTHROPLASTY Left 08/10/2019  ? ? ?Family Psychiatric History: Please see initial evaluation for full details. I have reviewed the history. No updates at this time.  ?  ? ?Family History:  ?Family History  ?Problem Relation Age of Onset  ? Diabetes Father   ? Healthy Mother   ? Breast cancer Neg Hx   ? ? ?Social History:  ?Social History  ? ?Socioeconomic History  ? Marital status: Single  ?  Spouse name: Not on file  ? Number of children: 0  ? Years of education: Not on file   ? Highest education level: Master's degree (e.g., MA, MS, MEng, MEd, MSW, MBA)  ?Occupational History  ? Occupation: Retired  ?Tobacco Use  ? Smoking status: Never  ? Smokeless tobacco: Never  ? Tobacco comments:  ?  smoking cessation materials not required  ?Vaping Use  ? Vaping Use: Never used  ?Substance and Sexual Activity  ? Alcohol use: No  ?  Alcohol/week: 0.0 standard drinks  ? Drug use: No  ? Sexual activity: Not Currently  ?  Birth control/protection: Post-menopausal  ?Other Topics Concern  ? Not on file  ?Social History Narrative  ? Not on file  ? ?Social Determinants of Health  ? ?Financial Resource Strain: Not on file  ?Food Insecurity: Not on file  ?Transportation Needs: Not on file  ?Physical Activity: Not on file  ?Stress: Not on file  ?Social Connections: Not on file  ? ? ?Allergies: No Known Allergies ? ?Metabolic Disorder Labs: ?No results found for: HGBA1C, MPG ?No results found for: PROLACTIN ?Lab Results  ?Component Value Date  ? CHOL 255 (H) 01/24/2021  ? TRIG 168 (H) 01/24/2021  ? HDL 84 01/24/2021  ? CHOLHDL 3.0 01/24/2021  ? LDLCALC 142 (H) 01/24/2021  ? LDLCALC 152 (H) 12/21/2019  ? ?Lab Results  ?Component Value Date  ? TSH 1.450 01/24/2021  ? TSH 1.980 12/21/2019  ? ? ?Therapeutic Level Labs: ?No results found for: LITHIUM ?No results found for: VALPROATE ?No components found for:  CBMZ ? ?Current Medications: ?Current Outpatient Medications  ?Medication Sig Dispense Refill  ? buPROPion (WELLBUTRIN XL) 150 MG 24 hr tablet Take 1 tablet (150 mg total) by mouth daily. Total of 450 mg daily. Take along with 300 mg tab 30 tablet 1  ? buPROPion (WELLBUTRIN XL) 300 MG 24 hr tablet Take 1 tablet (300 mg total) by mouth daily. 90 tablet 0  ? tretinoin (RETIN-A) 0.05 % cream RETIN-A, 0.05% (External Cream) - Historical Medication ? (0.05 %) ?Active    ? venlafaxine XR (EFFEXOR-XR) 150 MG 24 hr capsule Take 1 capsule (150 mg total) by mouth daily. 90 capsule 3  ? venlafaxine XR (EFFEXOR-XR) 75 MG  24 hr capsule Take 1 capsule (75 mg total) by mouth daily. 90 capsule 3  ? ?No current facility-administered medications for this visit.  ? ? ? ?Musculoskeletal: ?Strength & Muscle Tone:  N/A ?Gait & Station:  N/A ?Patient leans: N/A ? ?Psychiatric Specialty Exam: ?Review of Systems  ?Psychiatric/Behavioral:  Positive for decreased concentration, dysphoric mood and sleep disturbance. Negative for agitation, behavioral problems, confusion, hallucinations, self-injury and suicidal ideas. The patient is not nervous/anxious and is not hyperactive.   ?All other systems reviewed and are negative.  ?There were no vitals taken for this visit.There is no height or weight on file to calculate  BMI.  ?General Appearance: Fairly Groomed  ?Eye Contact:  Good  ?Speech:  Clear and Coherent  ?Volume:  Normal  ?Mood:   good and bad  ?Affect:  Appropriate, Congruent, and down at times  ?Thought Process:  Coherent  ?Orientation:  Full (Time, Place, and Person)  ?Thought Content: Logical   ?Suicidal Thoughts:  No  ?Homicidal Thoughts:  No  ?Memory:  Immediate;   Good  ?Judgement:  Good  ?Insight:  Good  ?Psychomotor Activity:  Normal  ?Concentration:  Concentration: Good and Attention Span: Good  ?Recall:  Good  ?Fund of Knowledge: Good  ?Language: Good  ?Akathisia:  No  ?Handed:  Right  ?AIMS (if indicated): not done  ?Assets:  Communication Skills ?Desire for Improvement  ?ADL's:  Intact  ?Cognition: WNL  ?Sleep:  Poor  ? ?Screenings: ?GAD-7   ? ?[place] Office Visit from 01/24/2021 in [facility] Office Visit from 12/21/2019 in [facility]  ?Total GAD-7 Score 0 0  ? ?  ? ?PHQ2-9   ? ?Flowsheet Row Video Visit from 06/03/2021 in [facility] Video Visit from 04/16/2021 in [facility] Office Visit from 01/24/2021 in [facility] from 07/16/2020 in [facility] Office Visit from 12/21/2019 in [facility]  ?PHQ-2 Total  Score 1 6 0 2 0  ?PHQ-9 Total Score '1 13 1 2 '$ 0  ? ?  ? ? ? ?Assessment and Plan:  ?Samantha Barton is a 71 y.o. year old female with a history of depression, arthritis, who presents for follow up a

## 2021-08-09 ENCOUNTER — Other Ambulatory Visit (HOSPITAL_COMMUNITY): Payer: Self-pay

## 2021-08-09 ENCOUNTER — Encounter: Payer: Self-pay | Admitting: Psychiatry

## 2021-08-09 ENCOUNTER — Other Ambulatory Visit: Payer: Self-pay

## 2021-08-09 ENCOUNTER — Telehealth (INDEPENDENT_AMBULATORY_CARE_PROVIDER_SITE_OTHER): Payer: PPO | Admitting: Psychiatry

## 2021-08-09 DIAGNOSIS — F330 Major depressive disorder, recurrent, mild: Secondary | ICD-10-CM

## 2021-08-09 MED ORDER — BUPROPION HCL ER (XL) 150 MG PO TB24
150.0000 mg | ORAL_TABLET | Freq: Every day | ORAL | 1 refills | Status: DC
  Filled 2021-08-09: qty 30, 30d supply, fill #0
  Filled 2021-09-09: qty 30, 30d supply, fill #1

## 2021-08-09 NOTE — Patient Instructions (Signed)
Continue Effexor 225 mg daily   ?Increase bupropion 450 mg daily ?Next appointment- 5/2 at 2 PM, video ?

## 2021-08-14 NOTE — Telephone Encounter (Signed)
Closing this message left voicemail twice with no call back ?

## 2021-08-19 ENCOUNTER — Other Ambulatory Visit (HOSPITAL_COMMUNITY): Payer: Self-pay

## 2021-08-21 ENCOUNTER — Ambulatory Visit (INDEPENDENT_AMBULATORY_CARE_PROVIDER_SITE_OTHER): Payer: PPO | Admitting: Psychologist

## 2021-08-21 ENCOUNTER — Other Ambulatory Visit (HOSPITAL_COMMUNITY): Payer: Self-pay

## 2021-08-21 ENCOUNTER — Other Ambulatory Visit: Payer: Self-pay

## 2021-08-21 DIAGNOSIS — F330 Major depressive disorder, recurrent, mild: Secondary | ICD-10-CM

## 2021-08-21 NOTE — Progress Notes (Signed)
[facility] Counselor/Therapist Progress Note ? ?Patient ID: Samantha Barton, MRN: [id number],   ? ?Date: 08/21/2021 ? ?Time Spent: 10:05 am to 10:46 am; total time: 41 minutes ? ? This session was held via in person. The patient consented to in-person therapy and was in the clinician's office. Limits of confidentiality were discussed with the patient.  ? ?Treatment Type: Individual Therapy ? ?Reported Symptoms: Depression ? ?Mental Status Exam: ?Appearance:  Well Groomed     ?Behavior: Appropriate  ?Motor: Normal  ?Speech/Language:  Clear and Coherent  ?Affect: Appropriate  ?Mood: normal  ?Thought process: normal  ?Thought content:   WNL  ?Sensory/Perceptual disturbances:   WNL  ?Orientation: oriented to person, place, and time/date  ?Attention: Good  ?Concentration: Good  ?Memory: WNL  ?Fund of knowledge:  Good  ?Insight:   Fair  ?Judgment:  Fair  ?Impulse Control: Good  ? ?Risk Assessment: ?Danger to Self:  No ?Self-injurious Behavior: No ?Danger to Others: No ?Duty to Warn:no ?Physical Aggression / Violence:No  ?Access to Firearms a concern: No  ?Gang Involvement:No  ? ?Subjective: Beginning the session, the patient described herself as better. After reviewing the treatment plan, patient indicated she had increased her medication that has assisted her. From there, she briefly processed concerns related to cognitive decline. She then spent the session reflecting on frustrations with aging. She processed thoughts and emotions. She agreed to follow up. She denied suicidal and homicidal ideation.   ? ?Interventions:  Worked on developing a therapeutic relationship with the patient using active listening and reflective statements. Provided emotional support using empathy and validation. Reviewed the treatment plan with the patient. Reviewed events since the intake. Praised patient for doing better and explored what has assisted the patient. Normalized and validated expressed thoughts. Identified goals  for the session. Used socratic questions to assist the patient. Provided psychoeducation about behavioral steps to offset possibly cognitive decline. Processed the idea of sleep and making dietary changes. Explored the idea of meeting with PT to discuss concerns related to loss of strength and stability. Assessed for suicidal and homicidal ideation.  ? ?Homework: NA ? ?Next Session: Emotional support ? ?Diagnosis: F33.0 major depressive affective disorder, recurrent, mild  ? ?Plan:  ? ?Client Abilities: Friendly and easy to develop rapport ? ?Client Preferences: ACT and CBT ? ?Client statement of Needs: Process aging ? ?Treatment Level: Outpatient  ? ?Goals ?Alleviate depressive symptoms ?Recognize, accept, and cope with depressive feelings ?Develop healthy thinking patterns ?Develop healthy interpersonal relationships ? ?Objectives target date for all objectives is 07/31/2022 ?Cooperate with a medication evaluation by a physician ?Verbalize an accurate understanding of depression ?Verbalize an understanding of the treatment ?Identify and replace thoughts that support depression ?Learn and implement behavioral strategies ?Verbalize an understanding and resolution of current interpersonal problems ?Learn and implement problem solving and decision making skills ?Learn and implement conflict resolution skills to resolve interpersonal problems ?Verbalize an understanding of healthy and unhealthy emotions verbalize insight into how past relationships may be influence current experiences with depression ?Use mindfulness and acceptance strategies and increase value based behavior  ?Increase hopeful statements about the future.  ?Interventions ?Consistent with treatment model, discuss how change in cognitive, behavioral, and interpersonal can help client alleviate depression ?CBT ?Behavioral activation help the client explore the relationship, nature of the dispute,  ?Help the client develop new interpersonal skills and  relationships ?Conduct Problem so living therapy ?Teach conflict resolution skills ?Use a process-experiential approach ?Conduct TLDP ?Conduct ACT ?Evaluate need for psychotropic medication ?  Monitor adherence to medication  ? ?The patient and clinician reviewed the treatment plan on 08/21/2021. The patient approved of the treatment plan.  ? ?Conception Chancy, PsyD ? ? ? ?

## 2021-08-21 NOTE — Progress Notes (Signed)
                Lael Pilch, PsyD 

## 2021-08-23 ENCOUNTER — Ambulatory Visit (INDEPENDENT_AMBULATORY_CARE_PROVIDER_SITE_OTHER): Payer: PPO | Admitting: Internal Medicine

## 2021-08-23 ENCOUNTER — Ambulatory Visit
Admission: RE | Admit: 2021-08-23 | Discharge: 2021-08-23 | Disposition: A | Discharge status: Home / Self Care | Payer: PPO | Attending: Internal Medicine | Admitting: Internal Medicine

## 2021-08-23 ENCOUNTER — Other Ambulatory Visit (HOSPITAL_COMMUNITY): Payer: Self-pay

## 2021-08-23 ENCOUNTER — Ambulatory Visit
Admission: RE | Admit: 2021-08-23 | Discharge: 2021-08-23 | Disposition: A | Discharge status: Home / Self Care | Payer: PPO | Source: Ambulatory Visit | Attending: Internal Medicine | Admitting: Internal Medicine

## 2021-08-23 ENCOUNTER — Encounter: Payer: Self-pay | Admitting: Internal Medicine

## 2021-08-23 VITALS — BP 130/82 | HR 82 | Ht 64.0 in | Wt 138.0 lb

## 2021-08-23 DIAGNOSIS — M545 Low back pain, unspecified: Secondary | ICD-10-CM

## 2021-08-23 DIAGNOSIS — R413 Other amnesia: Secondary | ICD-10-CM

## 2021-08-23 DIAGNOSIS — F3342 Major depressive disorder, recurrent, in full remission: Secondary | ICD-10-CM

## 2021-08-23 MED ORDER — CYCLOBENZAPRINE HCL 10 MG PO TABS
10.0000 mg | ORAL_TABLET | Freq: Every day | ORAL | 0 refills | Status: DC
  Filled 2021-08-23: qty 30, 30d supply, fill #0

## 2021-08-23 NOTE — Progress Notes (Signed)
? ? ?Date:  08/23/2021  ? ?Name:  Samantha Barton   DOB:  [birth year]   MRN:  [id number] ? ? ?Chief Complaint: Back Pain and Memory Loss ? ?Back Pain ?This is a recurrent problem. The current episode started more than 1 month ago. The problem occurs intermittently. The problem has been gradually worsening since onset. The pain is present in the lumbar spine. The quality of the pain is described as aching, shooting and stabbing. The pain does not radiate (left hip). The pain is moderate. The pain is The same all the time. The symptoms are aggravated by sitting, twisting and bending. Pertinent negatives include no chest pain, fever, headaches, numbness or weakness. She has tried heat, ice, NSAIDs and home exercises for the symptoms. The treatment provided no relief.  ? ?Memory Issues: This is a new problem. The last episode was yesterday. Patient says she is concerned about her short term memory. She says when she tries to put something away in the fridge, she ends up in the cabinet. She has lost money. She has left her keys in her front door several times, and forgot them. Losing her keys constantly, and cannot remember where she laid them down after just leaving the room. No history of brain issues, dementia, or alzheimer's in her family. ? ?  08/23/2021  ?  9:39 AM 01/04/2018  ? 11:10 AM  ?6CIT Screen  ?What Year? 0 points 0 points  ?What month? 0 points 0 points  ?What time? 0 points 0 points  ?Count back from 20 0 points 0 points  ?Months in reverse 0 points 0 points  ?Repeat phrase 0 points 0 points  ?Total Score 0 points 0 points  ? ?  ?Lab Results  ?Component Value Date  ? NA 141 01/24/2021  ? K 4.3 01/24/2021  ? CO2 26 01/24/2021  ? GLUCOSE 75 01/24/2021  ? BUN 24 01/24/2021  ? CREATININE 0.97 01/24/2021  ? CALCIUM 10.5 (H) 01/24/2021  ? EGFR 63 01/24/2021  ? GFRNONAA 56 (L) 12/21/2019  ? ?Lab Results  ?Component Value Date  ? CHOL 255 (H) 01/24/2021  ? HDL 84 01/24/2021  ? LDLCALC 142 (H) 01/24/2021  ? TRIG 168  (H) 01/24/2021  ? CHOLHDL 3.0 01/24/2021  ? ?Lab Results  ?Component Value Date  ? TSH 1.450 01/24/2021  ? ?No results found for: HGBA1C ?Lab Results  ?Component Value Date  ? WBC 5.9 01/24/2021  ? HGB 13.7 01/24/2021  ? HCT 38.4 01/24/2021  ? MCV 97 01/24/2021  ? PLT 313 01/24/2021  ? ?Lab Results  ?Component Value Date  ? ALT 13 01/24/2021  ? AST 23 01/24/2021  ? ALKPHOS 108 01/24/2021  ? BILITOT 0.3 01/24/2021  ? ?Lab Results  ?Component Value Date  ? VD25OH 35.6 12/21/2019  ?  ? ?Review of Systems  ?Constitutional:  Negative for chills, fatigue and fever.  ?Respiratory:  Negative for chest tightness, shortness of breath and wheezing.   ?Cardiovascular:  Negative for chest pain and leg swelling.  ?Musculoskeletal:  Positive for back pain.  ?Neurological:  Negative for dizziness, seizures, syncope, speech difficulty, weakness, numbness and headaches.  ?Hematological:  Does not bruise/bleed easily.  ?Psychiatric/Behavioral:  Positive for decreased concentration. Negative for dysphoric mood. The patient is not nervous/anxious.   ? ?Patient Active Problem List  ? Diagnosis Date Noted  ? Shortness of breath 12/16/2018  ? Recurrent major depressive disorder, in full remission ([place]) 06/24/2018  ? Osteopenia determined by x-ray 03/19/2016  ?  Hypertriglyceridemia 01/14/2015  ? ? ?No Known Allergies ? ?Past Surgical History:  ?Procedure Laterality Date  ? BREAST BIOPSY Right 1998  ? bx/clip-neg  ? FRACTURE SURGERY  2017  ? JOINT REPLACEMENT  08/10/2019  ? ORBITAL FRACTURE SURGERY  04/25/2015  ? ORIF ORBITAL FRACTURE Right 04/25/2015  ? Procedure: OPEN REDUCTION INTERNAL FIXATION (ORIF) ORBITAL FRACTURE;  Surgeon: Clyde Canterbury, MD;  Location: [facility] ORS;  Service: ENT;  Laterality: Right;  ? SIGMOIDOSCOPY    ? TOTAL HIP ARTHROPLASTY Left 08/10/2019  ? ? ?Social History  ? ?Tobacco Use  ? Smoking status: Never  ? Smokeless tobacco: Never  ? Tobacco comments:  ?  smoking cessation materials not required  ?Vaping Use  ? Vaping  Use: Never used  ?Substance Use Topics  ? Alcohol use: No  ?  Alcohol/week: 0.0 standard drinks  ? Drug use: No  ? ? ? ?Medication list has been reviewed and updated. ? ?Current Meds  ?Medication Sig  ? buPROPion (WELLBUTRIN XL) 150 MG 24 hr tablet Take 1 tablet (150 mg total) by mouth daily. Total of 450 mg daily. Take along with 300 mg tab  ? buPROPion (WELLBUTRIN XL) 300 MG 24 hr tablet Take 1 tablet (300 mg total) by mouth daily.  ? cyclobenzaprine (FLEXERIL) 10 MG tablet Take 1 tablet (10 mg total) by mouth at bedtime.  ? tretinoin (RETIN-A) 0.05 % cream RETIN-A, 0.05% (External Cream) - Historical Medication ? (0.05 %) ?Active  ? venlafaxine XR (EFFEXOR-XR) 150 MG 24 hr capsule Take 1 capsule (150 mg total) by mouth daily.  ? venlafaxine XR (EFFEXOR-XR) 75 MG 24 hr capsule Take 1 capsule (75 mg total) by mouth daily.  ? ? ? ?  08/23/2021  ?  9:44 AM 01/24/2021  ?  9:42 AM 12/21/2019  ? 10:19 AM  ?GAD 7 : Generalized Anxiety Score  ?Nervous, Anxious, on Edge 0 0 0  ?Control/stop worrying 0 0 0  ?Worry too much - different things 0 0 0  ?Trouble relaxing 0 0 0  ?Restless 0 0 0  ?Easily annoyed or irritable 0 0 0  ?Afraid - awful might happen 0 0 0  ?Total GAD 7 Score 0 0 0  ?Anxiety Difficulty Not difficult at all  Not difficult at all  ? ? ? ?  08/23/2021  ?  9:44 AM  ?Depression screen PHQ 2/9  ?Decreased Interest 0  ?Down, Depressed, Hopeless 0  ?PHQ - 2 Score 0  ?Altered sleeping 0  ?Tired, decreased energy 0  ?Change in appetite 0  ?Feeling bad or failure about yourself  0  ?Trouble concentrating 0  ?Moving slowly or fidgety/restless 0  ?Suicidal thoughts 0  ?PHQ-9 Score 0  ?Difficult doing work/chores Not difficult at all  ? ? ?BP Readings from Last 3 Encounters:  ?08/23/21 130/82  ?01/24/21 118/78  ?12/21/19 114/70  ? ? ?Physical Exam ?Vitals and nursing note reviewed.  ?Constitutional:   ?   General: She is not in acute distress. ?   Appearance: She is well-developed.  ?HENT:  ?   Head: Normocephalic and  atraumatic.  ?Neck:  ?   Vascular: No carotid bruit.  ?Cardiovascular:  ?   Rate and Rhythm: Normal rate and regular rhythm.  ?   Pulses: Normal pulses.  ?   Heart sounds: No murmur heard. ?Pulmonary:  ?   Effort: Pulmonary effort is normal. No respiratory distress.  ?   Breath sounds: No wheezing or rhonchi.  ?Musculoskeletal:  ?  Cervical back: Normal range of motion.  ?   Lumbar back: No spasms or tenderness. Decreased range of motion. Negative right straight leg raise test and negative left straight leg raise test.  ?   Right lower leg: No edema.  ?   Left lower leg: No edema.  ?Lymphadenopathy:  ?   Cervical: No cervical adenopathy.  ?Skin: ?   General: Skin is warm and dry.  ?   Findings: No rash.  ?Neurological:  ?   Mental Status: She is alert and oriented to person, place, and time.  ?   Sensory: Sensation is intact.  ?   Motor: Motor function is intact.  ?   Deep Tendon Reflexes:  ?   Reflex Scores: ?     Patellar reflexes are 2+ on the right side and 2+ on the left side. ?Psychiatric:     ?   Mood and Affect: Mood normal.     ?   Behavior: Behavior normal.  ? ? ?Wt Readings from Last 3 Encounters:  ?08/23/21 138 lb (62.6 kg)  ?01/24/21 134 lb (60.8 kg)  ?12/21/19 136 lb (61.7 kg)  ? ? ?BP 130/82   Pulse 82   Ht _0  (1.626 m)   Wt 138 lb (62.6 kg)   SpO2 99%   BMI 23.69 kg/m?  ? ?Assessment and Plan: ?1. Lumbar back pain ?Will get imaging and add flexeril at bedtime ?Continue Advil or Tylenol at bedtime as well ?- DG Lumbar Spine Complete ?- cyclobenzaprine (FLEXERIL) 10 MG tablet; Take 1 tablet (10 mg total) by mouth at bedtime.  Dispense: 30 tablet; Refill: 0 ? ?2. Memory difficulty ?Appears to be more inattention to daily activities rather than a short or long term memory loss. ?She is doing well from a depression perspective on Bupropion and Effexor. ?Will check labs for metabolic cause. ?- Vitamin B12 ?- Sedimentation rate ?- TSH + free T4 ? ?3. Recurrent major depressive disorder, in full  remission ([place]) ?Clinically stable on current regimen with good control of symptoms, No SI or HI. ?Will continue current therapy and psych care. ? ? ?Partially dictated using Editor, commissioning. Any errors are uninte

## 2021-08-24 LAB — SEDIMENTATION RATE: Sed Rate: 7 mm/hr (ref 0–40)

## 2021-08-24 LAB — VITAMIN B12: Vitamin B-12: 562 pg/mL (ref 232–1245)

## 2021-08-24 LAB — TSH+FREE T4
Free T4: 1.04 ng/dL (ref 0.82–1.77)
TSH: 1.37 u[IU]/mL (ref 0.450–4.500)

## 2021-08-26 ENCOUNTER — Other Ambulatory Visit (HOSPITAL_COMMUNITY): Payer: Self-pay

## 2021-08-26 ENCOUNTER — Telehealth: Payer: PPO | Admitting: Psychiatry

## 2021-08-26 MED ORDER — ZOSTER VAC RECOMB ADJUVANTED 50 MCG/0.5ML IM SUSR
0.5000 mL | INTRAMUSCULAR | 0 refills | Status: DC
  Filled 2021-08-26: qty 0.5, 1d supply, fill #0

## 2021-08-27 ENCOUNTER — Other Ambulatory Visit (HOSPITAL_COMMUNITY): Payer: Self-pay

## 2021-08-29 ENCOUNTER — Other Ambulatory Visit: Payer: Self-pay | Admitting: Psychiatry

## 2021-08-30 ENCOUNTER — Other Ambulatory Visit (HOSPITAL_COMMUNITY): Payer: Self-pay

## 2021-09-03 ENCOUNTER — Other Ambulatory Visit (HOSPITAL_COMMUNITY): Payer: Self-pay

## 2021-09-03 ENCOUNTER — Other Ambulatory Visit: Payer: Self-pay | Admitting: Psychiatry

## 2021-09-03 MED ORDER — BUPROPION HCL ER (XL) 300 MG PO TB24
300.0000 mg | ORAL_TABLET | Freq: Every day | ORAL | 1 refills | Status: DC
  Filled 2021-09-03: qty 90, 90d supply, fill #0
  Filled 2021-11-27: qty 90, 90d supply, fill #1

## 2021-09-09 ENCOUNTER — Other Ambulatory Visit (HOSPITAL_COMMUNITY): Payer: Self-pay

## 2021-09-13 ENCOUNTER — Other Ambulatory Visit (HOSPITAL_COMMUNITY): Payer: Self-pay
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
## 2021-09-24 ENCOUNTER — Encounter: Payer: Self-pay | Admitting: Psychiatry

## 2021-09-24 ENCOUNTER — Telehealth (INDEPENDENT_AMBULATORY_CARE_PROVIDER_SITE_OTHER): Payer: PPO | Admitting: Psychiatry

## 2021-09-24 ENCOUNTER — Other Ambulatory Visit (HOSPITAL_COMMUNITY): Payer: Self-pay

## 2021-09-24 DIAGNOSIS — F330 Major depressive disorder, recurrent, mild: Secondary | ICD-10-CM | POA: Diagnosis not present

## 2021-09-24 MED ORDER — BUPROPION HCL ER (XL) 150 MG PO TB24
150.0000 mg | ORAL_TABLET | Freq: Every day | ORAL | 0 refills | Status: DC
  Filled 2021-09-24 – 2021-10-07 (×2): qty 90, 90d supply, fill #0

## 2021-09-24 NOTE — Patient Instructions (Signed)
Continue venlafaxine 225 mg daily   ?Continue bupropion 450 mg daily ?Next appointment- 5/31 at 9:30, video ?

## 2021-09-25 ENCOUNTER — Ambulatory Visit: Payer: PPO | Admitting: Psychologist

## 2021-10-07 ENCOUNTER — Other Ambulatory Visit (HOSPITAL_COMMUNITY): Payer: Self-pay
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
## 2021-10-23 ENCOUNTER — Telehealth (INDEPENDENT_AMBULATORY_CARE_PROVIDER_SITE_OTHER): Payer: PPO | Admitting: Psychiatry

## 2021-10-23 ENCOUNTER — Encounter: Payer: Self-pay | Admitting: Psychiatry

## 2021-10-23 DIAGNOSIS — F330 Major depressive disorder, recurrent, mild: Secondary | ICD-10-CM | POA: Diagnosis not present

## 2021-10-23 NOTE — Patient Instructions (Signed)
Continue venlafaxine 225 mg daily   Continue bupropion 450 mg daily Next appointment- 7/12 at 10 AM, video

## 2021-11-18 ENCOUNTER — Other Ambulatory Visit (HOSPITAL_COMMUNITY): Payer: Self-pay

## 2021-11-27 ENCOUNTER — Other Ambulatory Visit (HOSPITAL_COMMUNITY): Payer: Self-pay
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
## 2021-12-04 ENCOUNTER — Other Ambulatory Visit (HOSPITAL_COMMUNITY): Payer: Self-pay

## 2021-12-04 ENCOUNTER — Encounter: Payer: Self-pay | Admitting: Psychiatry

## 2021-12-04 ENCOUNTER — Telehealth (INDEPENDENT_AMBULATORY_CARE_PROVIDER_SITE_OTHER): Payer: PPO | Admitting: Psychiatry

## 2021-12-04 DIAGNOSIS — F3341 Major depressive disorder, recurrent, in partial remission: Secondary | ICD-10-CM

## 2021-12-04 MED ORDER — BUPROPION HCL ER (XL) 150 MG PO TB24
150.0000 mg | ORAL_TABLET | Freq: Every day | ORAL | 0 refills | Status: DC
  Filled 2021-12-04 – 2022-01-01 (×2): qty 90, 90d supply, fill #0

## 2021-12-04 NOTE — Patient Instructions (Signed)
Continue venlafaxine 225 mg daily   Continue bupropion 450 mg daily Next appointment- 9/13 at 10:30, video

## 2021-12-16 ENCOUNTER — Other Ambulatory Visit (HOSPITAL_COMMUNITY): Payer: Self-pay

## 2021-12-18 ENCOUNTER — Encounter: Payer: Self-pay | Admitting: Podiatry

## 2021-12-18 ENCOUNTER — Ambulatory Visit: Payer: PPO | Admitting: Podiatry

## 2021-12-18 DIAGNOSIS — L853 Xerosis cutis: Secondary | ICD-10-CM

## 2021-12-18 DIAGNOSIS — M79609 Pain in unspecified limb: Secondary | ICD-10-CM | POA: Diagnosis not present

## 2021-12-18 DIAGNOSIS — B351 Tinea unguium: Secondary | ICD-10-CM | POA: Diagnosis not present
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
## 2022-01-01 ENCOUNTER — Other Ambulatory Visit (HOSPITAL_COMMUNITY): Payer: Self-pay

## 2022-01-02 ENCOUNTER — Other Ambulatory Visit (HOSPITAL_COMMUNITY): Payer: Self-pay

## 2022-01-28 ENCOUNTER — Ambulatory Visit (INDEPENDENT_AMBULATORY_CARE_PROVIDER_SITE_OTHER): Payer: PPO | Admitting: Internal Medicine

## 2022-01-28 ENCOUNTER — Encounter: Payer: Self-pay | Admitting: Internal Medicine

## 2022-01-28 VITALS — BP 118/70 | HR 104 | Ht 64.0 in | Wt 130.8 lb

## 2022-01-28 DIAGNOSIS — Z1211 Encounter for screening for malignant neoplasm of colon: Secondary | ICD-10-CM | POA: Diagnosis not present

## 2022-01-28 DIAGNOSIS — Z0000 Encounter for general adult medical examination without abnormal findings: Secondary | ICD-10-CM

## 2022-01-28 DIAGNOSIS — E781 Pure hyperglyceridemia: Secondary | ICD-10-CM

## 2022-01-28 DIAGNOSIS — Z23 Encounter for immunization: Secondary | ICD-10-CM

## 2022-01-28 DIAGNOSIS — Z131 Encounter for screening for diabetes mellitus: Secondary | ICD-10-CM | POA: Diagnosis not present

## 2022-01-28 DIAGNOSIS — F3342 Major depressive disorder, recurrent, in full remission: Secondary | ICD-10-CM | POA: Diagnosis not present

## 2022-01-28 DIAGNOSIS — Z1231 Encounter for screening mammogram for malignant neoplasm of breast: Secondary | ICD-10-CM

## 2022-01-28 DIAGNOSIS — M8580 Other specified disorders of bone density and structure, unspecified site: Secondary | ICD-10-CM

## 2022-01-28 DIAGNOSIS — R7303 Prediabetes: Secondary | ICD-10-CM | POA: Diagnosis not present

## 2022-01-28 NOTE — Progress Notes (Signed)
Date:  01/28/2022   Name:  Samantha Barton   DOB:  13-Sep-1950   MRN:  [id number]   Chief Complaint: Annual Exam (Declined breast exam.) Samantha Barton is a 71 y.o. female who presents today for her Complete Annual Exam. She feels well. She reports exercising. She reports she is sleeping well. Breast complaints - none.  She feels that she is doing very well.  She continues to ride her horse and play pickle ball regularly.  Mammogram: 03/2021 DEXA: 02/2019 osteopenia Pap smear: discontinued Colonoscopy: Cologuard 2021  There are no preventive care reminders to display for this patient.   Immunization History  Administered Date(s) Administered   Fluad Quad(high Dose 65+) 01/24/2021   Influenza, High Dose Seasonal PF 03/17/2018, 02/07/2019, 03/04/2020   Influenza,inj,Quad PF,6+ Mos 02/19/2017, 01/28/2022   Influenza-Unspecified 02/08/2016, 02/07/2019   PFIZER(Purple Top)SARS-COV-2 Vaccination 07/04/2019, 08/01/2019, 04/08/2020   Pneumococcal Conjugate-13 03/12/2016   Pneumococcal Polysaccharide-23 05/27/2009, 12/15/2017   Tdap 05/27/2013   Zoster Recombinat (Shingrix) 04/29/2021, 08/27/2021   Zoster, Live 05/27/2012    Depression        This is a chronic problem.The problem is unchanged.  Associated symptoms include no fatigue and no headaches.  Past treatments include SNRIs - Serotonin and norepinephrine reuptake inhibitors and other medications (effexor and bupropion).  Compliance with treatment is good.   Lab Results  Component Value Date   NA 141 01/24/2021   K 4.3 01/24/2021   CO2 26 01/24/2021   GLUCOSE 75 01/24/2021   BUN 24 01/24/2021   CREATININE 0.97 01/24/2021   CALCIUM 10.5 (H) 01/24/2021   EGFR 63 01/24/2021   GFRNONAA 56 (L) 12/21/2019   Lab Results  Component Value Date   CHOL 255 (H) 01/24/2021   HDL 84 01/24/2021   LDLCALC 142 (H) 01/24/2021   TRIG 168 (H) 01/24/2021   CHOLHDL 3.0 01/24/2021   Lab Results  Component Value Date   TSH 1.370  08/23/2021   No results found for: "HGBA1C" Lab Results  Component Value Date   WBC 5.9 01/24/2021   HGB 13.7 01/24/2021   HCT 38.4 01/24/2021   MCV 97 01/24/2021   PLT 313 01/24/2021   Lab Results  Component Value Date   ALT 13 01/24/2021   AST 23 01/24/2021   ALKPHOS 108 01/24/2021   BILITOT 0.3 01/24/2021   Lab Results  Component Value Date   VD25OH 35.6 12/21/2019     Review of Systems  Constitutional:  Negative for chills, fatigue and fever.  HENT:  Negative for congestion, hearing loss, tinnitus, trouble swallowing and voice change.   Eyes:  Negative for visual disturbance.  Respiratory:  Negative for cough, chest tightness, shortness of breath and wheezing.   Cardiovascular:  Negative for chest pain, palpitations and leg swelling.  Gastrointestinal:  Negative for abdominal pain, constipation, diarrhea and vomiting.  Endocrine: Negative for polydipsia and polyuria.  Genitourinary:  Negative for dysuria, frequency, genital sores, vaginal bleeding and vaginal discharge.  Musculoskeletal:  Negative for arthralgias, gait problem and joint swelling.  Skin:  Negative for color change and rash.  Neurological:  Negative for dizziness, tremors, light-headedness and headaches.  Hematological:  Negative for adenopathy. Does not bruise/bleed easily.  Psychiatric/Behavioral:  Positive for depression. Negative for dysphoric mood and sleep disturbance. The patient is not nervous/anxious.     Patient Active Problem List   Diagnosis Date Noted   Shortness of breath 12/16/2018   Recurrent major depressive disorder, in full remission ([place]) 06/24/2018  Osteopenia determined by x-ray 03/19/2016   Hypertriglyceridemia 01/14/2015    No Known Allergies  Past Surgical History:  Procedure Laterality Date   BREAST BIOPSY Right 1998   bx/clip-neg   FRACTURE SURGERY  2017   JOINT REPLACEMENT  08/10/2019   ORBITAL FRACTURE SURGERY  04/25/2015   ORIF ORBITAL FRACTURE Right 04/25/2015    Procedure: OPEN REDUCTION INTERNAL FIXATION (ORIF) ORBITAL FRACTURE;  Surgeon: Clyde Canterbury, MD;  Location: [facility] ORS;  Service: ENT;  Laterality: Right;   SIGMOIDOSCOPY     TOTAL HIP ARTHROPLASTY Left 08/10/2019    Social History   Tobacco Use   Smoking status: Never   Smokeless tobacco: Never   Tobacco comments:    smoking cessation materials not required  Vaping Use   Vaping Use: Never used  Substance Use Topics   Alcohol use: No    Alcohol/week: 0.0 standard drinks of alcohol   Drug use: No     Medication list has been reviewed and updated.  Current Meds  Medication Sig   buPROPion (WELLBUTRIN XL) 150 MG 24 hr tablet Take 1 tablet (150 mg total) by mouth daily. Total of 450 mg daily. Take along with 300 mg tab   buPROPion (WELLBUTRIN XL) 300 MG 24 hr tablet Take 1 tablet (300 mg total) by mouth daily.   Cholecalciferol (VITAMIN D) 50 MCG (2000 UT) CAPS    venlafaxine XR (EFFEXOR-XR) 150 MG 24 hr capsule Take 1 capsule (150 mg total) by mouth daily.       01/28/2022   10:09 AM 08/23/2021    9:44 AM 01/24/2021    9:42 AM 12/21/2019   10:19 AM  GAD 7 : Generalized Anxiety Score  Nervous, Anxious, on Edge 0 0 0 0  Control/stop worrying 0 0 0 0  Worry too much - different things 0 0 0 0  Trouble relaxing 0 0 0 0  Restless 0 0 0 0  Easily annoyed or irritable 0 0 0 0  Afraid - awful might happen 0 0 0 0  Total GAD 7 Score 0 0 0 0  Anxiety Difficulty Not difficult at all Not difficult at all  Not difficult at all       01/28/2022   10:09 AM 08/23/2021    9:44 AM 06/03/2021    8:51 AM  Depression screen PHQ 2/9  Decreased Interest 0 0   Down, Depressed, Hopeless 0 0   PHQ - 2 Score 0 0   Altered sleeping 0 0   Tired, decreased energy 0 0   Change in appetite 0 0   Feeling bad or failure about yourself  0 0   Trouble concentrating 0 0   Moving slowly or fidgety/restless 0 0   Suicidal thoughts 0 0   PHQ-9 Score 0 0   Difficult doing work/chores Not difficult at all  Not difficult at all      Information is confidential and restricted. Go to Review Flowsheets to unlock data.    BP Readings from Last 3 Encounters:  01/28/22 118/70  08/23/21 130/82  01/24/21 118/78    Physical Exam Vitals and nursing note reviewed.  Constitutional:      General: She is not in acute distress.    Appearance: She is well-developed.  HENT:     Head: Normocephalic and atraumatic.     Right Ear: Tympanic membrane and ear canal normal.     Left Ear: Tympanic membrane and ear canal normal.     Nose:  Right Sinus: No maxillary sinus tenderness.     Left Sinus: No maxillary sinus tenderness.  Eyes:     General: No scleral icterus.       Right eye: No discharge.        Left eye: No discharge.     Conjunctiva/sclera: Conjunctivae normal.  Neck:     Thyroid: No thyromegaly.     Vascular: No carotid bruit.  Cardiovascular:     Rate and Rhythm: Normal rate and regular rhythm.     Pulses: Normal pulses.     Heart sounds: Normal heart sounds.  Pulmonary:     Effort: Pulmonary effort is normal. No respiratory distress.     Breath sounds: No wheezing.  Abdominal:     General: Bowel sounds are normal.     Palpations: Abdomen is soft.     Tenderness: There is no abdominal tenderness.  Musculoskeletal:     Cervical back: Normal range of motion. No erythema.     Right lower leg: No edema.     Left lower leg: No edema.  Lymphadenopathy:     Cervical: No cervical adenopathy.  Skin:    General: Skin is warm and dry.     Findings: No rash.  Neurological:     Mental Status: She is alert and oriented to person, place, and time.     Cranial Nerves: No cranial nerve deficit.     Sensory: No sensory deficit.     Deep Tendon Reflexes: Reflexes are normal and symmetric.  Psychiatric:        Attention and Perception: Attention normal.        Mood and Affect: Mood normal.     Wt Readings from Last 3 Encounters:  01/28/22 130 lb 12.8 oz (59.3 kg)  08/23/21 138 lb  (62.6 kg)  01/24/21 134 lb (60.8 kg)    BP 118/70   Pulse (!) 104   Ht 5' 4"  (1.626 m)   Wt 130 lb 12.8 oz (59.3 kg)   SpO2 97%   BMI 22.45 kg/m   Assessment and Plan: 1. Annual physical exam Normal exam. Continue healthy diet and regular exercise - CBC with Differential/Platelet  2. Encounter for screening mammogram for breast cancer Schedule at [facility] - MM 3D SCREEN BREAST BILATERAL  3. Hypertriglyceridemia Check labs; continue low fat diet - Lipid panel  4. Osteopenia determined by x-ray Due for repeat DEXA Continue vitamin D daily - DG Bone Density  5. Recurrent major depressive disorder, in full remission ([place]) Clinically stable on current regimen with good control of symptoms, No SI or HI. Will continue current therapy and treatment by Psych. - TSH  6. Colon cancer screening Repeat Cologuard next year  7. Screening for diabetes mellitus - Comprehensive metabolic panel - Hemoglobin A1c   Partially dictated using Editor, commissioning. Any errors are unintentional.  Halina Maidens, MD [facility]  01/28/2022

## 2022-01-29 LAB — CBC WITH DIFFERENTIAL/PLATELET
Basophils Absolute: 0 10*3/uL (ref 0.0–0.2)
Basos: 1 %
EOS (ABSOLUTE): 0.1 10*3/uL (ref 0.0–0.4)
Eos: 1 %
Hematocrit: 38.4 % (ref 34.0–46.6)
Hemoglobin: 13.4 g/dL (ref 11.1–15.9)
Immature Grans (Abs): 0 10*3/uL (ref 0.0–0.1)
Immature Granulocytes: 0 %
Lymphocytes Absolute: 1.6 10*3/uL (ref 0.7–3.1)
Lymphs: 28 %
MCH: 33.8 pg — ABNORMAL HIGH (ref 26.6–33.0)
MCHC: 34.9 g/dL (ref 31.5–35.7)
MCV: 97 fL (ref 79–97)
Monocytes Absolute: 0.5 10*3/uL (ref 0.1–0.9)
Monocytes: 9 %
Neutrophils Absolute: 3.4 10*3/uL (ref 1.4–7.0)
Neutrophils: 61 %
Platelets: 298 10*3/uL (ref 150–450)
RBC: 3.96 x10E6/uL (ref 3.77–5.28)
RDW: 11.4 % — ABNORMAL LOW (ref 11.7–15.4)
WBC: 5.6 10*3/uL (ref 3.4–10.8)

## 2022-01-29 LAB — COMPREHENSIVE METABOLIC PANEL
ALT: 15 IU/L (ref 0–32)
AST: 22 IU/L (ref 0–40)
Albumin/Globulin Ratio: 2.1 (ref 1.2–2.2)
Albumin: 4.5 g/dL (ref 3.9–4.9)
Alkaline Phosphatase: 89 IU/L (ref 44–121)
BUN/Creatinine Ratio: 21 (ref 12–28)
BUN: 25 mg/dL (ref 8–27)
Bilirubin Total: 0.3 mg/dL (ref 0.0–1.2)
CO2: 26 mmol/L (ref 20–29)
Calcium: 10.8 mg/dL — ABNORMAL HIGH (ref 8.7–10.3)
Chloride: 101 mmol/L (ref 96–106)
Creatinine, Ser: 1.17 mg/dL — ABNORMAL HIGH (ref 0.57–1.00)
Globulin, Total: 2.1 g/dL (ref 1.5–4.5)
Glucose: 81 mg/dL (ref 70–99)
Potassium: 4.8 mmol/L (ref 3.5–5.2)
Sodium: 140 mmol/L (ref 134–144)
Total Protein: 6.6 g/dL (ref 6.0–8.5)
eGFR: 50 mL/min/{1.73_m2} — ABNORMAL LOW (ref >59)

## 2022-01-29 LAB — LIPID PANEL
Chol/HDL Ratio: 3 ratio (ref 0.0–4.4)
Cholesterol, Total: 268 mg/dL — ABNORMAL HIGH (ref 100–199)
HDL: 90 mg/dL (ref >39)
LDL Chol Calc (NIH): 159 mg/dL — ABNORMAL HIGH (ref 0–99)
Triglycerides: 114 mg/dL (ref 0–149)
VLDL Cholesterol Cal: 19 mg/dL (ref 5–40)

## 2022-01-29 LAB — TSH: TSH: 1.63 u[IU]/mL (ref 0.450–4.500)

## 2022-01-29 LAB — HEMOGLOBIN A1C
Est. average glucose Bld gHb Est-mCnc: 128 mg/dL
Hgb A1c MFr Bld: 6.1 % — ABNORMAL HIGH (ref 4.8–5.6)
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
## 2022-02-05 ENCOUNTER — Telehealth (INDEPENDENT_AMBULATORY_CARE_PROVIDER_SITE_OTHER): Payer: PPO | Admitting: Psychiatry

## 2022-02-05 ENCOUNTER — Other Ambulatory Visit (HOSPITAL_COMMUNITY): Payer: Self-pay

## 2022-02-05 ENCOUNTER — Encounter: Payer: Self-pay | Admitting: Psychiatry

## 2022-02-05 DIAGNOSIS — F3341 Major depressive disorder, recurrent, in partial remission: Secondary | ICD-10-CM | POA: Diagnosis not present

## 2022-02-05 MED ORDER — BUPROPION HCL ER (XL) 300 MG PO TB24
300.0000 mg | ORAL_TABLET | Freq: Every day | ORAL | 1 refills | Status: DC
  Filled 2022-02-05: qty 90, 90d supply, fill #0
  Filled 2022-02-27 – 2022-05-27 (×2): qty 90, 90d supply, fill #1

## 2022-02-05 MED ORDER — BUPROPION HCL ER (XL) 150 MG PO TB24
150.0000 mg | ORAL_TABLET | Freq: Every day | ORAL | 0 refills | Status: DC
  Filled 2022-02-05 – 2022-04-06 (×2): qty 90, 90d supply, fill #0

## 2022-02-05 NOTE — Patient Instructions (Signed)
Continue venlafaxine 225 mg daily   Continue bupropion 450 mg daily Next appointment- 12/6 at 11:30

## 2022-02-17 ENCOUNTER — Other Ambulatory Visit: Payer: Self-pay | Admitting: Internal Medicine

## 2022-02-17 DIAGNOSIS — F3342 Major depressive disorder, recurrent, in full remission: Secondary | ICD-10-CM

## 2022-02-18 ENCOUNTER — Other Ambulatory Visit (HOSPITAL_COMMUNITY): Payer: Self-pay

## 2022-02-18 MED ORDER — VENLAFAXINE HCL ER 75 MG PO CP24
75.0000 mg | ORAL_CAPSULE | Freq: Every day | ORAL | 3 refills | Status: DC
  Filled 2022-02-18: qty 90, 90d supply, fill #0
  Filled 2022-05-14: qty 90, 90d supply, fill #1
  Filled 2022-08-11: qty 90, 90d supply, fill #2
  Filled 2022-11-10: qty 90, 90d supply, fill #3

## 2022-02-18 NOTE — Telephone Encounter (Signed)
Requested Prescriptions  Pending Prescriptions Disp Refills  . venlafaxine XR (EFFEXOR-XR) 75 MG 24 hr capsule 90 capsule 3    Sig: Take 1 capsule (75 mg total) by mouth daily.     Psychiatry: Antidepressants - SNRI - desvenlafaxine & venlafaxine Failed - 02/17/2022 12:54 PM      Failed - Cr in normal range and within 360 days    Creatinine, Ser  Date Value Ref Range Status  01/28/2022 1.17 (H) 0.57 - 1.00 mg/dL Final         Failed - Lipid Panel in normal range within the last 12 months    Cholesterol, Total  Date Value Ref Range Status  01/28/2022 268 (H) 100 - 199 mg/dL Final   LDL Chol Calc (NIH)  Date Value Ref Range Status  01/28/2022 159 (H) 0 - 99 mg/dL Final   HDL  Date Value Ref Range Status  01/28/2022 90 >39 mg/dL Final   Triglycerides  Date Value Ref Range Status  01/28/2022 114 0 - 149 mg/dL Final         Passed - Completed PHQ-2 or PHQ-9 in the last 360 days      Passed - Last BP in normal range    BP Readings from Last 1 Encounters:  01/28/22 118/70         Passed - Valid encounter within last 6 months    Recent Outpatient Visits          3 weeks ago Annual physical exam   [place] [facility] at [facility], Jesse Sans, MD   5 months ago Lumbar back pain   [place] [facility] at [facility], Jesse Sans, MD   1 year ago Annual physical exam   [place] [facility] at [facility], Jesse Sans, MD   2 years ago Annual physical exam   [facility] at [facility], Jesse Sans, MD   2 years ago Hip pain, chronic, left   [facility] at [facility], Jesse Sans, MD      Future Appointments            In 11 months Army Melia, Jesse Sans, MD [place] [facility] at [facility], [facility]

## 2022-02-27 ENCOUNTER — Other Ambulatory Visit (HOSPITAL_COMMUNITY): Payer: Self-pay

## 2022-03-04 ENCOUNTER — Other Ambulatory Visit (HOSPITAL_COMMUNITY): Payer: Self-pay

## 2022-03-04 DIAGNOSIS — H401132 Primary open-angle glaucoma, bilateral, moderate stage: Secondary | ICD-10-CM | POA: Diagnosis not present

## 2022-03-04 MED ORDER — LATANOPROST 0.005 % OP SOLN
1.0000 [drp] | Freq: Every day | OPHTHALMIC | 1 refills | Status: DC
  Filled 2022-03-04: qty 2.5, 25d supply, fill #0
  Filled 2022-04-06: qty 2.5, 25d supply, fill #1

## 2022-03-05 ENCOUNTER — Other Ambulatory Visit (HOSPITAL_COMMUNITY): Payer: Self-pay

## 2022-03-14 ENCOUNTER — Other Ambulatory Visit (HOSPITAL_COMMUNITY): Payer: Self-pay

## 2022-03-14 ENCOUNTER — Other Ambulatory Visit: Payer: Self-pay | Admitting: Internal Medicine

## 2022-03-14 DIAGNOSIS — F3342 Major depressive disorder, recurrent, in full remission: Secondary | ICD-10-CM

## 2022-03-14 MED ORDER — VENLAFAXINE HCL ER 150 MG PO CP24
150.0000 mg | ORAL_CAPSULE | Freq: Every day | ORAL | 3 refills | Status: DC
  Filled 2022-03-14: qty 90, 90d supply, fill #0
  Filled 2022-06-14: qty 90, 90d supply, fill #1
  Filled 2022-09-08: qty 90, 90d supply, fill #2
  Filled 2022-12-08: qty 90, 90d supply, fill #3

## 2022-03-14 NOTE — Telephone Encounter (Signed)
Requested Prescriptions  Pending Prescriptions Disp Refills  . venlafaxine XR (EFFEXOR-XR) 150 MG 24 hr capsule 90 capsule 3    Sig: Take 1 capsule (150 mg total) by mouth daily.     Psychiatry: Antidepressants - SNRI - desvenlafaxine & venlafaxine Failed - 03/14/2022  9:22 AM      Failed - Cr in normal range and within 360 days    Creatinine, Ser  Date Value Ref Range Status  01/28/2022 1.17 (H) 0.57 - 1.00 mg/dL Final         Failed - Lipid Panel in normal range within the last 12 months    Cholesterol, Total  Date Value Ref Range Status  01/28/2022 268 (H) 100 - 199 mg/dL Final   LDL Chol Calc (NIH)  Date Value Ref Range Status  01/28/2022 159 (H) 0 - 99 mg/dL Final   HDL  Date Value Ref Range Status  01/28/2022 90 >39 mg/dL Final   Triglycerides  Date Value Ref Range Status  01/28/2022 114 0 - 149 mg/dL Final         Passed - Completed PHQ-2 or PHQ-9 in the last 360 days      Passed - Last BP in normal range    BP Readings from Last 1 Encounters:  01/28/22 118/70         Passed - Valid encounter within last 6 months    Recent Outpatient Visits          1 month ago Annual physical exam   [place] [facility] at [facility], Jesse Sans, MD   6 months ago Lumbar back pain   [place] [facility] at [facility], Jesse Sans, MD   1 year ago Annual physical exam   [place] [facility] at [facility], Jesse Sans, MD   2 years ago Annual physical exam   [place] [facility] at [facility], Jesse Sans, MD   2 years ago Hip pain, chronic, left   [facility] at [facility], Jesse Sans, MD      Future Appointments            In 10 months Army Melia Jesse Sans, MD [place] and Sports Medicine at [facility], [facility]

## 2022-03-17 DIAGNOSIS — R7303 Prediabetes: Secondary | ICD-10-CM | POA: Insufficient documentation

## 2022-04-07 ENCOUNTER — Other Ambulatory Visit (HOSPITAL_COMMUNITY): Payer: Self-pay

## 2022-04-15 ENCOUNTER — Ambulatory Visit: Payer: Self-pay

## 2022-04-15 NOTE — Telephone Encounter (Signed)
Message from Samantha Barton sent at 04/15/2022  1:34 PM EST  Summary: Possible UTI symptoms, Abx request   Pt called to report possible UTI symptoms, she is requesting an antibiotic. Wants clinical advice before scheduling an appt  [facility] [address] [place] [address] Phone: [phone] Fax: [phone]         Chief Complaint: urinary urgency and frequency Symptoms: urinary burning, decreased stream Frequency: 1 week Pertinent Negatives: Patient denies fever, flank pain Disposition: '[]'$ ED /'[x]'$ Urgent Care (no appt availability in office) / '[]'$ Appointment(In office/virtual)/ '[]'$  [facility]/ '[]'$ Home Care/ '[]'$ Refused Recommended Disposition /'[]'$ [facility]/ '[]'$  Follow-up with PCP Additional Notes: pt did not want to drive to office for appt- advised pt to go to UC.   Reason for Disposition  Age > 50 years  Answer Assessment - Initial Assessment Questions 1. SEVERITY: "How bad is the pain?"  (e.g., Scale 1-10; mild, moderate, or severe)   - MILD (1-3): complains slightly about urination hurting   - MODERATE (4-7): interferes with normal activities     - SEVERE (8-10): excruciating, unwilling or unable to urinate because of the pain      mild 2. FREQUENCY: "How many times have you had painful urination today?"      Worse in am 3 times 3. PATTERN: "Is pain present every time you urinate or just sometimes?"      yes 4. ONSET: "When did the painful urination start?"      1 week 5. FEVER: "Do you have a fever?" If Yes, ask: "What is your temperature, how was it measured, and when did it start?"     no 6. PAST UTI: "Have you had a urine infection before?" If Yes, ask: "When was the last time?" and "What happened that time?"      Yes- abx 7. CAUSE: "What do you think is causing the painful urination?"  (e.g., UTI, scratch, Herpes sore)     UTI 8. OTHER SYMPTOMS: "Do you have any other symptoms?" (e.g., blood in  urine, flank pain, genital sores, urgency, vaginal discharge)     Urgency, low flow stream, frequency  9. PREGNANCY: "Is there any chance you are pregnant?" "When was your last menstrual period?"     N/a  Protocols used: Urination Pain - Female-A-AH
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
## 2022-04-30 ENCOUNTER — Telehealth (INDEPENDENT_AMBULATORY_CARE_PROVIDER_SITE_OTHER): Payer: PPO | Admitting: Psychiatry

## 2022-04-30 ENCOUNTER — Encounter: Payer: Self-pay | Admitting: Psychiatry

## 2022-04-30 DIAGNOSIS — F411 Generalized anxiety disorder: Secondary | ICD-10-CM

## 2022-04-30 DIAGNOSIS — F330 Major depressive disorder, recurrent, mild: Secondary | ICD-10-CM | POA: Diagnosis not present

## 2022-04-30 NOTE — Patient Instructions (Signed)
Continue venlafaxine 225 mg daily   Continue bupropion 450 mg daily Next appointment- 1/10 at 11 AM

## 2022-06-02 NOTE — Progress Notes (Unsigned)
Virtual Visit via Video Note  I connected with Samantha Barton on 06/04/22 at 11:00 AM EST by a video enabled telemedicine application and verified that I am speaking with the correct person using two identifiers.  Location: Patient: home Provider: office Persons participated in the visit- patient, provider    I discussed the limitations of evaluation and management by telemedicine and the availability of in person appointments. The patient expressed understanding and agreed to proceed.    I discussed the assessment and treatment plan with the patient. The patient was provided an opportunity to ask questions and all were answered. The patient agreed with the plan and demonstrated an understanding of the instructions.   The patient was advised to call back or seek an in-person evaluation if the symptoms worsen or if the condition fails to improve as anticipated.  I provided 22 minutes of non-face-to-face time during this encounter.   Norman Clay, MD    [facility] MD/PA/NP OP Progress Note  06/04/2022 11:34 AM Samantha Barton  MRN:  [id number]  Chief Complaint:  Chief Complaint  Patient presents with   Follow-up   HPI:  This is a follow-up appointment for depression and anxiety.  She states that her mother had a skull fracture.  She was visiting her grandchildren in [place].  She brought her mother over to [facility], and was found to have hypertensive urgency.  Her mother is currently at [facility].  She has been doing mentally better, and her memory is back.  She also states that there is a personality change, and her mother is very nice.  However, she states that it is too late, stating that her mother never wanted to be a mother.  She reports great support from her sisters through this.  Although she has not been doing horseback riding or activities, she enjoys watching movies.  She sleeps well.  She denies change in appetite.  She denies feeling depressed or anxiety.  She denies SI.  She denies  alcohol use or drug use.  She feels comfortable to stay on the current medication regimen at this time.    Visit Diagnosis:    ICD-10-CM   1. MDD (major depressive disorder), recurrent, in partial remission ([place])  F33.41     2. Anxiety state  F41.1       Past Psychiatric History: Please see initial evaluation for full details. I have reviewed the history. No updates at this time.     Past Medical History:  Past Medical History:  Diagnosis Date   Arthritis    Depression    Depression, major, recurrent, mild ([place]) 01/14/2015   Fracture, orbital ([place]) 06/2015   Right Eye -Horse ACCIDENT   Hand fracture, right 06/2015   Horse Accident    Hypertriglyceridemia     Past Surgical History:  Procedure Laterality Date   BREAST BIOPSY Right 1998   bx/clip-neg   FRACTURE SURGERY  2017   JOINT REPLACEMENT  08/10/2019   ORBITAL FRACTURE SURGERY  04/25/2015   ORIF ORBITAL FRACTURE Right 04/25/2015   Procedure: OPEN REDUCTION INTERNAL FIXATION (ORIF) ORBITAL FRACTURE;  Surgeon: Clyde Canterbury, MD;  Location: [facility] ORS;  Service: ENT;  Laterality: Right;   [place] Left 08/10/2019    Family Psychiatric History: Please see initial evaluation for full details. I have reviewed the history. No updates at this time.     Family History:  Family History  Problem Relation Age of Onset   Diabetes Father  Healthy Mother    Breast cancer Neg Hx     Social History:  Social History   Socioeconomic History   Marital status: Single    Spouse name: Not on file   Number of children: 0   Years of education: Not on file   Highest education level: Master's degree (e.g., MA, MS, MEng, MEd, MSW, MBA)  Occupational History   Occupation: Retired  Tobacco Use   Smoking status: Never   Smokeless tobacco: Never   Tobacco comments:    smoking cessation materials not required  Vaping Use   Vaping Use: Never used  Substance and Sexual Activity   Alcohol use: No     Alcohol/week: 0.0 standard drinks of alcohol   Drug use: No   Sexual activity: Not Currently    Birth control/protection: Post-menopausal  Other Topics Concern   Not on file  Social History Narrative   Not on file   Social Determinants of Health   Financial Resource Strain: Low Risk  (07/16/2020)   Overall Financial Resource Strain (CARDIA)    Difficulty of Paying Living Expenses: Not hard at all  Food Insecurity: No Food Insecurity (07/16/2020)   Hunger Vital Sign    Worried About Running Out of Food in the Last Year: Never true    [place] in the Last Year: Never true  Transportation Needs: No Transportation Needs (07/16/2020)   PRAPARE - Hydrologist (Medical): No    Lack of Transportation (Non-Medical): No  Physical Activity: Sufficiently Active (07/16/2020)   Exercise Vital Sign    Days of Exercise per Week: 4 days    Minutes of Exercise per Session: 60 min  Stress: No Stress Concern Present (07/16/2020)   [place]    Feeling of Stress : Only a little  Social Connections: Socially Isolated (07/16/2020)   Social Connection and Isolation Panel [NHANES]    Frequency of Communication with Friends and Family: Once a week    Frequency of Social Gatherings with Friends and Family: Once a week    Attends Religious Services: Never    Marine scientist or Organizations: No    Attends Music therapist: Never    Marital Status: Divorced    Allergies: No Known Allergies  Metabolic Disorder Labs: Lab Results  Component Value Date   HGBA1C 6.1 (H) 01/28/2022   No results found for: "PROLACTIN" Lab Results  Component Value Date   CHOL 268 (H) 01/28/2022   TRIG 114 01/28/2022   HDL 90 01/28/2022   CHOLHDL 3.0 01/28/2022   LDLCALC 159 (H) 01/28/2022   LDLCALC 142 (H) 01/24/2021   Lab Results  Component Value Date   TSH 1.630 01/28/2022   TSH 1.370 08/23/2021     Therapeutic Level Labs: No results found for: "LITHIUM" No results found for: "VALPROATE" No results found for: "CBMZ"  Current Medications: Current Outpatient Medications  Medication Sig Dispense Refill   [START ON 07/07/2022] buPROPion (WELLBUTRIN XL) 150 MG 24 hr tablet Take 1 tablet (150 mg total) by mouth daily. Take along with 300 mg tab for a total of 450 mg daily. 90 tablet 1   buPROPion (WELLBUTRIN XL) 300 MG 24 hr tablet Take 1 tablet (300 mg total) by mouth daily. *fill after 02-24-22 90 tablet 1   Cholecalciferol (VITAMIN D) 50 MCG (2000 UT) CAPS      latanoprost (XALATAN) 0.005 % ophthalmic solution Place 1 drop  into both eyes at bedtime. 2.5 mL 1   venlafaxine XR (EFFEXOR-XR) 150 MG 24 hr capsule Take 1 capsule (150 mg total) by mouth daily. 90 capsule 3   venlafaxine XR (EFFEXOR-XR) 75 MG 24 hr capsule Take 1 capsule (75 mg total) by mouth daily. 90 capsule 3   No current facility-administered medications for this visit.     Musculoskeletal: Strength & Muscle Tone:  N/A Gait & Station:  N/A Patient leans: N/A  Psychiatric Specialty Exam: Review of Systems  Psychiatric/Behavioral:  Negative for agitation, behavioral problems, confusion, decreased concentration, dysphoric mood, hallucinations, self-injury, sleep disturbance and suicidal ideas. The patient is not nervous/anxious and is not hyperactive.   All other systems reviewed and are negative.   There were no vitals taken for this visit.There is no height or weight on file to calculate BMI.  General Appearance: Fairly Groomed  Eye Contact:  Good  Speech:  Clear and Coherent  Volume:  Normal  Mood:   good  Affect:  Appropriate, Congruent, and calm  Thought Process:  Coherent  Orientation:  Full (Time, Place, and Person)  Thought Content: Logical   Suicidal Thoughts:  No  Homicidal Thoughts:  No  Memory:  Immediate;   Good  Judgement:  Good  Insight:  Good  Psychomotor Activity:  Normal  Concentration:   Concentration: Good and Attention Span: Good  Recall:  Good  Fund of Knowledge: Good  Language: Good  Akathisia:  No  Handed:  Right  AIMS (if indicated): not done  Assets:  Communication Skills Desire for Improvement  ADL's:  Intact  Cognition: WNL  Sleep:  Good   Screenings: GAD-7    Flowsheet Row Office Visit from 01/28/2022 in [place] and Sports Medicine at [place] Visit from 08/23/2021 in [place] and Sports Medicine at [place] Visit from 01/24/2021 in [place] and Sports Medicine at [place] Visit from 12/21/2019 in [place] and Sports Medicine at [facility]  Total GAD-7 Score 0 0 0 0      PHQ2-9    [place] Office Visit from 01/28/2022 in [place] and Sports Medicine at [place] Visit from 08/23/2021 in [place] and Sports Medicine at [facility] Video Visit from 06/03/2021 in [place] Video Visit from 04/16/2021 in [place] Office Visit from 01/24/2021 in [place] and Sports Medicine at [place]  PHQ-2 Total Score 0 0 1 6 0  PHQ-9 Total Score 0 0 '1 13 1        '$ Assessment and Plan:  Samantha Barton is a 72 y.o. year old female with a history of depression, arthritis, who presents for follow up appointment for below.   1. MDD (major depressive disorder), recurrent, in partial remission ([place]) 2. Anxiety state There has been overall improvement in depressive symptoms and anxiety since the last visit.  Recent psychosocial stressors includes her mother's medical condition, who is currently admitted at [facility].  Other psychosocial stressors includes grief of loss of her father, her physical condition of glaucoma/aging.  Will continue current medication regimen of venlafaxine and bupropion to target depression and anxiety.    #  Cognitive deficits Improving. She has no known family history of dementia.  TSH, vitamin B12 was within normal limits in March 2023. Head CT is not done.  She does not have any other symptoms to  be concerned of underlying neurological disorder at this time. Will plan to do further evaluation if any significant worsening in her symptoms and/or no improvement despite improvement in her mood in the future.    Plan  Continue venlafaxine 225 mg daily   Continue bupropion 450 mg daily Next appointment- 3/20 at 9:30 AM, video [email or web address]   Past trials of medication: fluoxetine, citalopram, venlafaxine, bupropion, lithium, Abilify (headache),    The patient demonstrates the following risk factors for suicide: Chronic risk factors for suicide include: psychiatric disorder of depression. Acute risk factors for suicide include: unemployment and social withdrawal/isolation. Protective factors for this patient include: coping skills and hope for the future. Considering these factors, the overall suicide risk at this point appears to be low. Patient is appropriate for outpatient follow up.      Collaboration of Care: Collaboration of Care: Other reviewed notes in Epic  Patient/Guardian was advised Release of Information must be obtained prior to any record release in order to collaborate their care with an outside provider. Patient/Guardian was advised if they have not already done so to contact the registration department to sign all necessary forms in order for [place] to release information regarding their care.   Consent: Patient/Guardian gives verbal consent for treatment and assignment of benefits for services provided during this visit. Patient/Guardian expressed understanding and agreed to proceed.    Norman Clay, MD 06/04/2022, 11:34 AM

## 2022-06-04 ENCOUNTER — Telehealth (INDEPENDENT_AMBULATORY_CARE_PROVIDER_SITE_OTHER): Payer: PPO | Admitting: Psychiatry

## 2022-06-04 ENCOUNTER — Other Ambulatory Visit (HOSPITAL_COMMUNITY): Payer: Self-pay

## 2022-06-04 ENCOUNTER — Encounter: Payer: Self-pay | Admitting: Psychiatry

## 2022-06-04 DIAGNOSIS — F411 Generalized anxiety disorder: Secondary | ICD-10-CM

## 2022-06-04 DIAGNOSIS — F3341 Major depressive disorder, recurrent, in partial remission: Secondary | ICD-10-CM

## 2022-06-04 MED ORDER — BUPROPION HCL ER (XL) 150 MG PO TB24
150.0000 mg | ORAL_TABLET | Freq: Every day | ORAL | 1 refills | Status: DC
  Filled 2022-06-04 – 2022-07-02 (×2): qty 90, 90d supply, fill #0
  Filled 2022-09-28: qty 90, 90d supply, fill #1

## 2022-06-04 MED ORDER — BUPROPION HCL ER (XL) 300 MG PO TB24
300.0000 mg | ORAL_TABLET | Freq: Every day | ORAL | 1 refills | Status: DC
  Filled 2022-06-04 – 2022-08-25 (×2): qty 90, 90d supply, fill #0
  Filled 2022-11-21: qty 90, 90d supply, fill #1

## 2022-06-04 NOTE — Patient Instructions (Signed)
Continue venlafaxine 225 mg daily   Continue bupropion 450 mg daily Next appointment- 3/20 at 9:30 AM

## 2022-06-10 ENCOUNTER — Other Ambulatory Visit (HOSPITAL_BASED_OUTPATIENT_CLINIC_OR_DEPARTMENT_OTHER): Payer: Self-pay

## 2022-06-10 DIAGNOSIS — H401122 Primary open-angle glaucoma, left eye, moderate stage: Secondary | ICD-10-CM | POA: Diagnosis not present

## 2022-06-10 MED ORDER — LATANOPROST 0.005 % OP SOLN
1.0000 [drp] | Freq: Every evening | OPHTHALMIC | 5 refills | Status: DC
  Filled 2022-06-10 – 2022-09-08 (×3): qty 5, 100d supply, fill #0
  Filled 2022-11-13: qty 5, 50d supply, fill #1

## 2022-06-11 ENCOUNTER — Other Ambulatory Visit (HOSPITAL_COMMUNITY): Payer: Self-pay

## 2022-06-11 ENCOUNTER — Other Ambulatory Visit (HOSPITAL_BASED_OUTPATIENT_CLINIC_OR_DEPARTMENT_OTHER): Payer: Self-pay

## 2022-06-16 ENCOUNTER — Ambulatory Visit (INDEPENDENT_AMBULATORY_CARE_PROVIDER_SITE_OTHER): Payer: PPO

## 2022-06-16 DIAGNOSIS — Z0000 Encounter for general adult medical examination without abnormal findings: Secondary | ICD-10-CM | POA: Diagnosis not present

## 2022-06-16 NOTE — Patient Instructions (Signed)
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
## 2022-07-02 ENCOUNTER — Other Ambulatory Visit: Payer: Self-pay

## 2022-07-02 ENCOUNTER — Other Ambulatory Visit (HOSPITAL_COMMUNITY): Payer: Self-pay

## 2022-07-04 ENCOUNTER — Other Ambulatory Visit (HOSPITAL_COMMUNITY): Payer: Self-pay

## 2022-07-25 ENCOUNTER — Ambulatory Visit
Admission: RE | Admit: 2022-07-25 | Discharge: 2022-07-25 | Disposition: A | Discharge status: Home / Self Care | Payer: PPO | Source: Ambulatory Visit | Attending: Internal Medicine | Admitting: Internal Medicine

## 2022-07-25 DIAGNOSIS — Z780 Asymptomatic menopausal state: Secondary | ICD-10-CM | POA: Diagnosis not present

## 2022-07-25 DIAGNOSIS — Z1231 Encounter for screening mammogram for malignant neoplasm of breast: Secondary | ICD-10-CM | POA: Diagnosis not present

## 2022-07-25 DIAGNOSIS — M8589 Other specified disorders of bone density and structure, multiple sites: Secondary | ICD-10-CM | POA: Diagnosis not present

## 2022-08-10 NOTE — Progress Notes (Unsigned)
Virtual Visit via Video Note  I connected with Samantha Barton on 08/13/22 at  9:30 AM EDT by a video enabled telemedicine application and verified that I am speaking with the correct person using two identifiers.  Location: Patient: home Provider: office Persons participated in the visit- patient, provider    I discussed the limitations of evaluation and management by telemedicine and the availability of in person appointments. The patient expressed understanding and agreed to proceed.    I discussed the assessment and treatment plan with the patient. The patient was provided an opportunity to ask questions and all were answered. The patient agreed with the plan and demonstrated an understanding of the instructions.   The patient was advised to call back or seek an in-person evaluation if the symptoms worsen or if the condition fails to improve as anticipated.  I provided 21 minutes of non-face-to-face time during this encounter.   Norman Clay, MD    [facility] MD/PA/NP OP Progress Note  08/13/2022 10:02 AM Samantha Barton  MRN:  [id number]  Chief Complaint:  Chief Complaint  Patient presents with   Follow-up   HPI:  This is a follow-up appointment for depression and anxiety.  She states that it has been very difficult.  She does things for her mother every day, including visiting her, doing emails or calling. It is overwhelming. Her mother is now a hospice patient, while her mother has no idea what they are doing.  She is now wheelchair-bound as she did not get out of the bed.  Samantha has started to have vomiting, which she attributes to the stress.  She also has weight loss.  She feels exhausted when she does not do anything.  She has not been able to see her horse as she has no time.  She feels anger, and states that it brings up resentment.  She has erratic sleep.  She denies SI.  She denies panic attacks.  She does not think it is depression as she feels in a different way, and wants  to stay on the current medication regimen.  119 lbs Wt Readings from Last 3 Encounters:  01/28/22 130 lb 12.8 oz (59.3 kg)  08/23/21 138 lb (62.6 kg)  01/24/21 134 lb (60.8 kg)     Visit Diagnosis:    ICD-10-CM   1. MDD (major depressive disorder), recurrent, in partial remission ([place])  F33.41     2. Anxiety disorder, unspecified type  F41.9       Past Psychiatric History: Please see initial evaluation for full details. I have reviewed the history. No updates at this time.     Past Medical History:  Past Medical History:  Diagnosis Date   Arthritis    Depression    Depression, major, recurrent, mild ([place]) 01/14/2015   Fracture, orbital ([place]) 06/2015   Right Eye -Horse ACCIDENT   Hand fracture, right 06/2015   Horse Accident    Hypertriglyceridemia     Past Surgical History:  Procedure Laterality Date   BREAST BIOPSY Right 1998   bx/clip-neg   FRACTURE SURGERY  2017   JOINT REPLACEMENT  08/10/2019   ORBITAL FRACTURE SURGERY  04/25/2015   ORIF ORBITAL FRACTURE Right 04/25/2015   Procedure: OPEN REDUCTION INTERNAL FIXATION (ORIF) ORBITAL FRACTURE;  Surgeon: Clyde Canterbury, MD;  Location: [facility] ORS;  Service: ENT;  Laterality: Right;   [place] Left 08/10/2019    Family Psychiatric History: Please see initial evaluation for full  details. I have reviewed the history. No updates at this time.     Family History:  Family History  Problem Relation Age of Onset   Diabetes Father    Healthy Mother    Breast cancer Neg Hx     Social History:  Social History   Socioeconomic History   Marital status: Single    Spouse name: Not on file   Number of children: 0   Years of education: Not on file   Highest education level: Master's degree (e.g., MA, MS, MEng, MEd, MSW, MBA)  Occupational History   Occupation: Retired  Tobacco Use   Smoking status: Never   Smokeless tobacco: Never   Tobacco comments:    smoking cessation materials not  required  Vaping Use   Vaping Use: Never used  Substance and Sexual Activity   Alcohol use: No    Alcohol/week: 0.0 standard drinks of alcohol   Drug use: No   Sexual activity: Not Currently    Birth control/protection: Post-menopausal  Other Topics Concern   Not on file  Social History Narrative   Not on file   Social Determinants of Health   Financial Resource Strain: Low Risk  (06/16/2022)   Overall Financial Resource Strain (CARDIA)    Difficulty of Paying Living Expenses: Not hard at all  Food Insecurity: No Food Insecurity (06/16/2022)   Hunger Vital Sign    Worried About Running Out of Food in the Last Year: Never true    [place] in the Last Year: Never true  Transportation Needs: No Transportation Needs (06/16/2022)   PRAPARE - Hydrologist (Medical): No    Lack of Transportation (Non-Medical): No  Physical Activity: Insufficiently Active (06/16/2022)   Exercise Vital Sign    Days of Exercise per Week: 2 days    Minutes of Exercise per Session: 40 min  Stress: No Stress Concern Present (06/16/2022)   Energy    Feeling of Stress : Not at all  Social Connections: Socially Isolated (06/16/2022)   Social Connection and Isolation Panel [NHANES]    Frequency of Communication with Friends and Family: Once a week    Frequency of Social Gatherings with Friends and Family: Never    Attends Religious Services: Never    Marine scientist or Organizations: No    Attends Music therapist: Never    Marital Status: Divorced    Allergies: No Known Allergies  Metabolic Disorder Labs: Lab Results  Component Value Date   HGBA1C 6.1 (H) 01/28/2022   No results found for: "PROLACTIN" Lab Results  Component Value Date   CHOL 268 (H) 01/28/2022   TRIG 114 01/28/2022   HDL 90 01/28/2022   CHOLHDL 3.0 01/28/2022   LDLCALC 159 (H) 01/28/2022   LDLCALC 142 (H)  01/24/2021   Lab Results  Component Value Date   TSH 1.630 01/28/2022   TSH 1.370 08/23/2021    Therapeutic Level Labs: No results found for: "LITHIUM" No results found for: "VALPROATE" No results found for: "CBMZ"  Current Medications: Current Outpatient Medications  Medication Sig Dispense Refill   buPROPion (WELLBUTRIN XL) 150 MG 24 hr tablet Take 1 tablet (150 mg total) by mouth daily. Take along with 300 mg tab for a total of 450 mg daily. 90 tablet 1   buPROPion (WELLBUTRIN XL) 300 MG 24 hr tablet Take 1 tablet (300 mg total) by mouth daily. 90 tablet  1   Cholecalciferol (VITAMIN D) 50 MCG (2000 UT) CAPS      latanoprost (XALATAN) 0.005 % ophthalmic solution Place 1 drop into both eyes every evening. 5 mL 5   venlafaxine XR (EFFEXOR-XR) 150 MG 24 hr capsule Take 1 capsule (150 mg total) by mouth daily. 90 capsule 3   venlafaxine XR (EFFEXOR-XR) 75 MG 24 hr capsule Take 1 capsule (75 mg total) by mouth daily. 90 capsule 3   No current facility-administered medications for this visit.     Musculoskeletal: Strength & Muscle Tone:  N/A Gait & Station:  N/A Patient leans: N/A  Psychiatric Specialty Exam: Review of Systems  Psychiatric/Behavioral:  Positive for sleep disturbance. Negative for agitation, behavioral problems, confusion, decreased concentration, dysphoric mood, hallucinations, self-injury and suicidal ideas. The patient is nervous/anxious. The patient is not hyperactive.     There were no vitals taken for this visit.There is no height or weight on file to calculate BMI.  General Appearance: Fairly Groomed  Eye Contact:  Good  Speech:  Clear and Coherent  Volume:  Normal  Mood:   overwhelming  Affect:  Appropriate and Congruent  Thought Process:  Coherent  Orientation:  Full (Time, Place, and Person)  Thought Content: Logical   Suicidal Thoughts:  No  Homicidal Thoughts:  No  Memory:  Immediate;   Good  Judgement:  Good  Insight:  Good  Psychomotor  Activity:  Normal  Concentration:  Concentration: Good and Attention Span: Good  Recall:  Good  Fund of Knowledge: Good  Language: Good  Akathisia:  No  Handed:  Right  AIMS (if indicated): not done  Assets:  Communication Skills Desire for Improvement  ADL's:  Intact  Cognition: WNL  Sleep:  Poor   Screenings: GAD-7    Flowsheet Row Office Visit from 01/28/2022 in [place] at [place] Visit from 08/23/2021 in [place] at [place] Visit from 01/24/2021 in [place] at [place] Visit from 12/21/2019 in [place] at [place]  Total GAD-7 Score 0 0 0 0      PHQ2-9    [place] from 06/16/2022 in [place] at [place] Visit from 01/28/2022 in [place] at [place] Visit from 08/23/2021 in [place] at [facility] Video Visit from 06/03/2021 in [place] Video Visit from 04/16/2021 in [place]  PHQ-2 Total Score 0 0 0 1 6  PHQ-9 Total Score -- 0 0 1 13        Assessment and Plan:  Samantha Barton is a 72 y.o. year old female with a history of depression, arthritis, who presents for follow up appointment for below.    1. MDD (major depressive disorder), recurrent, in partial remission ([place]) 2. Anxiety disorder, unspecified type Acute stressors include: taking care of her mother in ALF/hospice care  Other stressors include: loss of her father   History:    There is worsening in the sense of overwhelmed and started to have symptoms of nausea and vomiting, although she adamantly denies feeling depressed in the setting of stressors as above.  Although she may benefit from  adjusting in her medication, she prefers to stay on the current medication regimen  while she is amenable to consider if any worsening in her symptoms.  Will continue venlafaxine to target depression and anxiety.  Will continue bupropion as adjunctive treatment for depression.  Although she will benefit from supportive therapy/CBT, the resources limited given she prefers weekly appointment.   # weight loss/vomiting She started to have vomiting in the past few months, and has had weight loss despite good appetite.  She is advised to contact her PCP for further evaluation.    # Cognitive deficits Improving. She has no known family history of dementia.  TSH, vitamin B12 was within normal limits in March 2023. Head CT is not done.  She does not have any other symptoms to be concerned of underlying neurological disorder at this time. Will plan to do further evaluation if any significant worsening in her symptoms and/or no improvement despite improvement in her mood in the future.    Plan  Continue venlafaxine 225 mg daily   Continue bupropion 450 mg daily Next appointment- 4/24 at 10 30, video [email or web address]   Past trials of medication: fluoxetine, citalopram, venlafaxine, bupropion, lithium, Abilify (headache),    The patient demonstrates the following risk factors for suicide: Chronic risk factors for suicide include: psychiatric disorder of depression. Acute risk factors for suicide include: unemployment and social withdrawal/isolation. Protective factors for this patient include: coping skills and hope for the future. Considering these factors, the overall suicide risk at this point appears to be low. Patient is appropriate for outpatient follow up.    Collaboration of Care: Collaboration of Care: Other reviewed notes in Epic  Patient/Guardian was advised Release of Information must be obtained prior to any record release in order to collaborate their care with an outside provider.  Patient/Guardian was advised if they have not already done so to contact the registration department to sign all necessary forms in order for [place] to release information regarding their care.   Consent: Patient/Guardian gives verbal consent for treatment and assignment of benefits for services provided during this visit. Patient/Guardian expressed understanding and agreed to proceed.    Norman Clay, MD 08/13/2022, 10:02 AM

## 2022-08-13 ENCOUNTER — Telehealth (INDEPENDENT_AMBULATORY_CARE_PROVIDER_SITE_OTHER): Payer: PPO | Admitting: Psychiatry

## 2022-08-13 ENCOUNTER — Encounter: Payer: Self-pay | Admitting: Psychiatry

## 2022-08-13 DIAGNOSIS — F3341 Major depressive disorder, recurrent, in partial remission: Secondary | ICD-10-CM

## 2022-08-13 DIAGNOSIS — F419 Anxiety disorder, unspecified: Secondary | ICD-10-CM

## 2022-08-25 ENCOUNTER — Other Ambulatory Visit (HOSPITAL_COMMUNITY): Payer: Self-pay

## 2022-09-10 DIAGNOSIS — L738 Other specified follicular disorders: Secondary | ICD-10-CM | POA: Diagnosis not present

## 2022-09-10 DIAGNOSIS — D2262 Melanocytic nevi of left upper limb, including shoulder: Secondary | ICD-10-CM | POA: Diagnosis not present

## 2022-09-10 DIAGNOSIS — L821 Other seborrheic keratosis: Secondary | ICD-10-CM | POA: Diagnosis not present

## 2022-09-10 DIAGNOSIS — Z419 Encounter for procedure for purposes other than remedying health state, unspecified: Secondary | ICD-10-CM | POA: Diagnosis not present

## 2022-09-10 DIAGNOSIS — D225 Melanocytic nevi of trunk: Secondary | ICD-10-CM | POA: Diagnosis not present

## 2022-09-10 DIAGNOSIS — D2239 Melanocytic nevi of other parts of face: Secondary | ICD-10-CM | POA: Diagnosis not present

## 2022-09-10 DIAGNOSIS — L814 Other melanin hyperpigmentation: Secondary | ICD-10-CM | POA: Diagnosis not present

## 2022-09-14 NOTE — Progress Notes (Deleted)
BH MD/PA/NP OP Progress Note  09/14/2022 4:58 AM Samantha Barton  MRN:  [id number]  Chief Complaint: No chief complaint on file.  HPI: *** Visit Diagnosis: No diagnosis found.  Past Psychiatric History: Please see initial evaluation for full details. I have reviewed the history. No updates at this time.     Past Medical History:  Past Medical History:  Diagnosis Date   Arthritis    Depression    Depression, major, recurrent, mild (H[facility]) 01/14/2015   Fracture, orbital ([facility]) 06/2015   Right Eye -Horse ACCIDENT   Hand fracture, right 06/2015   Horse Accident    Hypertriglyceridemia     Past Surgical History:  Procedure Laterality Date   BREAST BIOPSY Right 1998   bx/clip-neg   FRACTURE SURGERY  2017   JOINT REPLACEMENT  08/10/2019   ORBITAL FRACTURE SURGERY  04/25/2015   ORIF ORBITAL FRACTURE Right 04/25/2015   Procedure: OPEN REDUCTION INTERNAL FIXATION (ORIF) ORBITAL FRACTURE;  Surgeon: Geanie Logan, MD;  Location: [facility] ORS;  Service: ENT;  Laterality: Right;   SIGMOIDOSCOPY     TOTAL HIP ARTHROPLASTY Left 08/10/2019    Family Psychiatric History: Please see initial evaluation for full details. I have reviewed the history. No updates at this time.     Family History:  Family History  Problem Relation Age of Onset   Diabetes Father    Healthy Mother    Breast cancer Neg Hx     Social History:  Social History   Socioeconomic History   Marital status: Single    Spouse name: Not on file   Number of children: 0   Years of education: Not on file   Highest education level: Master's degree (e.g., MA, MS, MEng, MEd, MSW, MBA)  Occupational History   Occupation: Retired  Tobacco Use   Smoking status: Never   Smokeless tobacco: Never   Tobacco comments:    smoking cessation materials not required  Vaping Use   Vaping Use: Never used  Substance and Sexual Activity   Alcohol use: No    Alcohol/week: 0.0 standard drinks of alcohol   Drug use: No   Sexual activity:  Not Currently    Birth control/protection: Post-menopausal  Other Topics Concern   Not on file  Social History Narrative   Not on file   Social Determinants of Health   Financial Resource Strain: Low Risk  (06/16/2022)   Overall Financial Resource Strain (CARDIA)    Difficulty of Paying Living Expenses: Not hard at all  Food Insecurity: No Food Insecurity (06/16/2022)   Hunger Vital Sign    Worried About Running Out of Food in the Last Year: Never true    Ran Out of Food in the Last Year: Never true  Transportation Needs: No Transportation Needs (06/16/2022)   PRAPARE - Administrator, Civil Service (Medical): No    Lack of Transportation (Non-Medical): No  Physical Activity: Insufficiently Active (06/16/2022)   Exercise Vital Sign    Days of Exercise per Week: 2 days    Minutes of Exercise per Session: 40 min  Stress: No Stress Concern Present (06/16/2022)   [employer] of Occupational Health - Occupational Stress Questionnaire    Feeling of Stress : Not at all  Social Connections: Socially Isolated (06/16/2022)   Social Connection and Isolation Panel [NHANES]    Frequency of Communication with Friends and Family: Once a week    Frequency of Social Gatherings with Friends and Family: Never    Attends  Religious Services: Never    Active Member of Clubs or Organizations: No    Attends Banker Meetings: Never    Marital Status: Divorced    Allergies: No Known Allergies  Metabolic Disorder Labs: Lab Results  Component Value Date   HGBA1C 6.1 (H) 01/28/2022   No results found for: "PROLACTIN" Lab Results  Component Value Date   CHOL 268 (H) 01/28/2022   TRIG 114 01/28/2022   HDL 90 01/28/2022   CHOLHDL 3.0 01/28/2022   LDLCALC 159 (H) 01/28/2022   LDLCALC 142 (H) 01/24/2021   Lab Results  Component Value Date   TSH 1.630 01/28/2022   TSH 1.370 08/23/2021    Therapeutic Level Labs: No results found for: "LITHIUM" No results found for:  "VALPROATE" No results found for: "CBMZ"  Current Medications: Current Outpatient Medications  Medication Sig Dispense Refill   buPROPion (WELLBUTRIN XL) 150 MG 24 hr tablet Take 1 tablet (150 mg total) by mouth daily. Take along with 300 mg tab for a total of 450 mg daily. 90 tablet 1   buPROPion (WELLBUTRIN XL) 300 MG 24 hr tablet Take 1 tablet (300 mg total) by mouth daily. 90 tablet 1   Cholecalciferol (VITAMIN D) 50 MCG (2000 UT) CAPS      latanoprost (XALATAN) 0.005 % ophthalmic solution Place 1 drop into both eyes every evening. 5 mL 5   venlafaxine XR (EFFEXOR-XR) 150 MG 24 hr capsule Take 1 capsule (150 mg total) by mouth daily. 90 capsule 3   venlafaxine XR (EFFEXOR-XR) 75 MG 24 hr capsule Take 1 capsule (75 mg total) by mouth daily. 90 capsule 3   No current facility-administered medications for this visit.     Musculoskeletal: Strength & Muscle Tone:  N/A Gait & Station:  N/A Patient leans: N/A  Psychiatric Specialty Exam: Review of Systems  There were no vitals taken for this visit.There is no height or weight on file to calculate BMI.  General Appearance: {Appearance:22683}  Eye Contact:  {BHH EYE CONTACT:22684}  Speech:  Clear and Coherent  Volume:  Normal  Mood:  {BHH MOOD:22306}  Affect:  {Affect (PAA):22687}  Thought Process:  Coherent  Orientation:  Full (Time, Place, and Person)  Thought Content: Logical   Suicidal Thoughts:  {ST/HT (PAA):22692}  Homicidal Thoughts:  {ST/HT (PAA):22692}  Memory:  Immediate;   Good  Judgement:  {Judgement (PAA):22694}  Insight:  {Insight (PAA):22695}  Psychomotor Activity:  Normal  Concentration:  Concentration: Good and Attention Span: Good  Recall:  Good  Fund of Knowledge: Good  Language: Good  Akathisia:  No  Handed:  Right  AIMS (if indicated): not done  Assets:  Communication Skills Desire for Improvement  ADL's:  Intact  Cognition: WNL  Sleep:  {BHH GOOD/FAIR/POOR:22877}   Screenings: GAD-7     Flowsheet Row Office Visit from 01/28/2022 in [facility] at [facility] Office Visit from 08/23/2021 in [facility] at [facility] Office Visit from 01/24/2021 in [facility] at [facility] Office Visit from 12/21/2019 in [facility] at [facility]  Total GAD-7 Score 0 0 0 0      PHQ2-9    Flowsheet Row Clinical Support from 06/16/2022 in [facility] Care & [facility] Medicine at [facility] Office Visit from 01/28/2022 in [facility] & [facility] at [place] [facility] Office Visit from 08/23/2021 in [facility] at [facility] Video Visit from 06/03/2021 in [facility] Video Visit from 04/16/2021 in [facility]  PHQ-2 Total Score 0 0 0 1 6  PHQ-9 Total Score -- 0 0 1 13        Assessment and Plan:  Dalaina Samariah Hokenson is a 72 y.o. year old female with a history of depression, arthritis, who presents for follow up appointment for below.    1. MDD (major depressive disorder), recurrent, in partial remission (HCC) 2. Anxiety disorder, unspecified type Acute stressors include: taking care of her mother in ALF/hospice care  Other stressors include: loss of her father   History:    There is worsening in the sense of overwhelmed and started to have symptoms of nausea and vomiting, although she adamantly denies feeling depressed in the setting of stressors as above.  Although she may benefit from adjusting in her medication, she prefers to stay on the current medication regimen while she is amenable to consider if any worsening in her symptoms.  Will continue venlafaxine to target depression and anxiety.  Will continue bupropion as adjunctive treatment for depression.  Although she will benefit from supportive therapy/CBT, the  resources limited given she prefers weekly appointment.    # weight loss/vomiting She started to have vomiting in the past few months, and has had weight loss despite good appetite.  She is advised to contact her PCP for further evaluation.    # Cognitive deficits Improving. She has no known family history of dementia.  TSH, vitamin B12 was within normal limits in March 2023. Head CT is not done.  She does not have any other symptoms to be concerned of underlying neurological disorder at this time. Will plan to do further evaluation if any significant worsening in her symptoms and/or no improvement despite improvement in her mood in the future.    Plan  Continue venlafaxine 225 mg daily   Continue bupropion 450 mg daily Next appointment- 4/24 at 10 30, video [email or web address]   Past trials of medication: fluoxetine, citalopram, venlafaxine, bupropion, lithium, Abilify (headache),    The patient demonstrates the following risk factors for suicide: Chronic risk factors for suicide include: psychiatric disorder of depression. Acute risk factors for suicide include: unemployment and social withdrawal/isolation. Protective factors for this patient include: coping skills and hope for the future. Considering these factors, the overall suicide risk at this point appears to be low. Patient is appropriate for outpatient follow up.    Collaboration of Care: Collaboration of Care: {BH OP Collaboration of Care:21014065}  Patient/Guardian was advised Release of Information must be obtained prior to any record release in order to collaborate their care with an outside provider. Patient/Guardian was advised if they have not already done so to contact the registration department to sign all necessary forms in order for [place] to release information regarding their care.   Consent: Patient/Guardian gives verbal consent for treatment and assignment of benefits for services provided during this visit.  Patient/Guardian expressed understanding and agreed to proceed.    Neysa Hotter, MD 09/14/2022, 4:58 AM

## 2022-09-17 ENCOUNTER — Telehealth: Payer: PPO | Admitting: Psychiatry

## 2022-09-24 ENCOUNTER — Ambulatory Visit: Payer: Self-pay

## 2022-09-24 NOTE — Telephone Encounter (Signed)
     Chief Complaint: SOB with minimal exertion Symptoms: Above Frequency: Several months, getting worse. Pertinent Negatives: Patient denies cough or wheezing Disposition: [] ED /[] Urgent Care (no appt availability in office) / [] Appointment(In office/virtual)/ []  [place] [facility]/ [] Home Care/ [] Refused Recommended Disposition /[] [place] [facility]/ [x]  Follow-up with PCP Additional Notes: Instructed to call back for worsening of symptoms.  Answer Assessment - Initial Assessment Questions 1. RESPIRATORY STATUS: "Describe your breathing?" (e.g., wheezing, shortness of breath, unable to speak, severe coughing)      SOB 2. ONSET: "When did this breathing problem begin?"      Several months, getting worse 3. PATTERN "Does the difficult breathing come and go, or has it been constant since it started?"      Comes and goes 4. SEVERITY: "How bad is your breathing?" (e.g., mild, moderate, severe)    - MILD: No SOB at rest, mild SOB with walking, speaks normally in sentences, can lie down, no retractions, pulse < 100.    - MODERATE: SOB at rest, SOB with minimal exertion and prefers to sit, cannot lie down flat, speaks in phrases, mild retractions, audible wheezing, pulse 100-120.    - SEVERE: Very SOB at rest, speaks in single words, struggling to breathe, sitting hunched forward, retractions, pulse > 120      Mild 5. RECURRENT SYMPTOM: "Have you had difficulty breathing before?" If Yes, ask: "When was the last time?" and "What happened that time?"      No 6. CARDIAC HISTORY: "Do you have any history of heart disease?" (e.g., heart attack, angina, bypass surgery, angioplasty)      No 7. LUNG HISTORY: "Do you have any history of lung disease?"  (e.g., pulmonary embolus, asthma, emphysema)     No 8. CAUSE: "What do you think is causing the breathing problem?"      Unsure 9. OTHER SYMPTOMS: "Do you have any other symptoms? (e.g., dizziness, runny nose, cough, chest pain, fever)      No 10. O2 SATURATION MONITOR:  "Do you use an oxygen saturation monitor (pulse oximeter) at home?" If Yes, ask: "What is your reading (oxygen level) today?" "What is your usual oxygen saturation reading?" (e.g., 95%)       No 11. PREGNANCY: "Is there any chance you are pregnant?" "When was your last menstrual period?"       No 12. TRAVEL: "Have you traveled out of the country in the last month?" (e.g., travel history, exposures)       No  Protocols used: Breathing Difficulty-A-AH

## 2022-09-29 ENCOUNTER — Other Ambulatory Visit (HOSPITAL_COMMUNITY): Payer: Self-pay

## 2022-09-29 ENCOUNTER — Ambulatory Visit (INDEPENDENT_AMBULATORY_CARE_PROVIDER_SITE_OTHER): Payer: PPO | Admitting: Internal Medicine

## 2022-09-29 ENCOUNTER — Encounter: Payer: Self-pay | Admitting: Internal Medicine

## 2022-09-29 VITALS — BP 122/70 | HR 111 | Ht 64.0 in | Wt 117.4 lb

## 2022-09-29 DIAGNOSIS — R634 Abnormal weight loss: Secondary | ICD-10-CM

## 2022-09-29 DIAGNOSIS — J4540 Moderate persistent asthma, uncomplicated: Secondary | ICD-10-CM

## 2022-09-29 DIAGNOSIS — R0602 Shortness of breath: Secondary | ICD-10-CM | POA: Diagnosis not present

## 2022-09-29 DIAGNOSIS — R7303 Prediabetes: Secondary | ICD-10-CM

## 2022-09-29 LAB — POCT GLYCOSYLATED HEMOGLOBIN (HGB A1C): Hemoglobin A1C: 5.7 % — AB (ref 4.0–5.6)

## 2022-09-29 MED ORDER — ALBUTEROL SULFATE HFA 108 (90 BASE) MCG/ACT IN AERS
2.0000 | INHALATION_SPRAY | Freq: Four times a day (QID) | RESPIRATORY_TRACT | 3 refills | Status: DC | PRN
  Filled 2022-09-29: qty 6.7, 25d supply, fill #0
  Filled 2022-11-13: qty 6.7, 25d supply, fill #1
  Filled 2022-12-18: qty 6.7, 25d supply, fill #2
  Filled 2023-03-22: qty 6.7, 25d supply, fill #3

## 2022-09-29 MED ORDER — FLUTICASONE-SALMETEROL 100-50 MCG/ACT IN AEPB
1.0000 | INHALATION_SPRAY | Freq: Two times a day (BID) | RESPIRATORY_TRACT | 3 refills | Status: DC
  Filled 2022-09-29: qty 60, 30d supply, fill #0

## 2022-09-29 NOTE — Assessment & Plan Note (Signed)
Suspect recurrence of asthma with shortness of breath and wheezing; not currently treated Begin Advair bid and albuterol as needed

## 2022-09-29 NOTE — Assessment & Plan Note (Addendum)
Concern for overt DM with unintentional weight loss. A1C today is 5.7 so weight loss is likely due to anxiety, chronic shortness of breath, etc

## 2022-09-29 NOTE — Assessment & Plan Note (Signed)
DM ruled out. Continue efforts with healthy diet Monitor weights at home and call if persistent

## 2022-09-29 NOTE — Progress Notes (Signed)
Date:  09/29/2022   Name:  Samantha Barton   DOB:  1950-10-09   MRN:  [id number]   Chief Complaint: Shortness of Breath (On minimum exertion, she has shortness of breathe and has to sit down. Tachycardia. Hx of asthma in her 74s.)  Shortness of Breath This is a recurrent problem. The current episode started more than 1 year ago. The problem occurs daily. Associated symptoms include chest pain and wheezing. Pertinent negatives include no abdominal pain, fever, headaches, orthopnea or sputum production. She has tried nothing for the symptoms. Her past medical history is significant for asthma.  Weight loss - she has not had a change in diet but has lost 17 lbs since last visit.  She is thirsty and had urinary frequency.  Last A1C was 6.1.  Lab Results  Component Value Date   NA 140 01/28/2022   K 4.8 01/28/2022   CO2 26 01/28/2022   GLUCOSE 81 01/28/2022   BUN 25 01/28/2022   CREATININE 1.17 (H) 01/28/2022   CALCIUM 10.8 (H) 01/28/2022   EGFR 50 (L) 01/28/2022   GFRNONAA 56 (L) 12/21/2019   Lab Results  Component Value Date   CHOL 268 (H) 01/28/2022   HDL 90 01/28/2022   LDLCALC 159 (H) 01/28/2022   TRIG 114 01/28/2022   CHOLHDL 3.0 01/28/2022   Lab Results  Component Value Date   TSH 1.630 01/28/2022   Lab Results  Component Value Date   HGBA1C 5.7 (A) 09/29/2022   Lab Results  Component Value Date   WBC 5.6 01/28/2022   HGB 13.4 01/28/2022   HCT 38.4 01/28/2022   MCV 97 01/28/2022   PLT 298 01/28/2022   Lab Results  Component Value Date   ALT 15 01/28/2022   AST 22 01/28/2022   ALKPHOS 89 01/28/2022   BILITOT 0.3 01/28/2022   Lab Results  Component Value Date   VD25OH 35.6 12/21/2019     Review of Systems  Constitutional:  Positive for unexpected weight change. Negative for appetite change, fatigue and fever.  HENT:  Negative for trouble swallowing.   Respiratory:  Positive for chest tightness, shortness of breath and wheezing. Negative for sputum  production.   Cardiovascular:  Positive for chest pain. Negative for orthopnea.  Gastrointestinal:  Negative for abdominal pain, constipation and diarrhea.  Genitourinary:  Positive for frequency.  Musculoskeletal:  Negative for arthralgias.  Neurological:  Negative for weakness, numbness and headaches.  Psychiatric/Behavioral:  Positive for dysphoric mood and sleep disturbance. The patient is nervous/anxious.     Patient Active Problem List   Diagnosis Date Noted   Moderate persistent asthma without complication 09/29/2022   Weight loss, unintentional 09/29/2022   Prediabetes 03/17/2022   Shortness of breath 12/16/2018   Recurrent major depressive disorder, in full remission (HCC) 06/24/2018   Osteopenia determined by x-ray 03/19/2016   Hypertriglyceridemia 01/14/2015    No Known Allergies  Past Surgical History:  Procedure Laterality Date   BREAST BIOPSY Right 1998   bx/clip-neg   FRACTURE SURGERY  2017   JOINT REPLACEMENT  08/10/2019   ORBITAL FRACTURE SURGERY  04/25/2015   ORIF ORBITAL FRACTURE Right 04/25/2015   Procedure: OPEN REDUCTION INTERNAL FIXATION (ORIF) ORBITAL FRACTURE;  Surgeon: Geanie Logan, MD;  Location: [facility] ORS;  Service: ENT;  Laterality: Right;   SIGMOIDOSCOPY     TOTAL HIP ARTHROPLASTY Left 08/10/2019    Social History   Tobacco Use   Smoking status: Never   Smokeless tobacco: Never   Tobacco  comments:    smoking cessation materials not required  Vaping Use   Vaping Use: Never used  Substance Use Topics   Alcohol use: No    Alcohol/week: 0.0 standard drinks of alcohol   Drug use: No     Medication list has been reviewed and updated.  Current Meds  Medication Sig   albuterol (VENTOLIN HFA) 108 (90 Base) MCG/ACT inhaler Inhale 2 puffs into the lungs every 6 (six) hours as needed for wheezing or shortness of breath.   buPROPion (WELLBUTRIN XL) 150 MG 24 hr tablet Take 1 tablet (150 mg total) by mouth daily. Take along with 300 mg tab for a  total of 450 mg daily.   buPROPion (WELLBUTRIN XL) 300 MG 24 hr tablet Take 1 tablet (300 mg total) by mouth daily.   Cholecalciferol (VITAMIN D) 50 MCG (2000 UT) CAPS    fluticasone-salmeterol (ADVAIR) 100-50 MCG/ACT AEPB Inhale 1 puff into the lungs 2 (two) times daily.   latanoprost (XALATAN) 0.005 % ophthalmic solution Place 1 drop into both eyes every evening.   venlafaxine XR (EFFEXOR-XR) 150 MG 24 hr capsule Take 1 capsule (150 mg total) by mouth daily.   venlafaxine XR (EFFEXOR-XR) 75 MG 24 hr capsule Take 1 capsule (75 mg total) by mouth daily.       01/28/2022   10:09 AM 08/23/2021    9:44 AM 01/24/2021    9:42 AM 12/21/2019   10:19 AM  GAD 7 : Generalized Anxiety Score  Nervous, Anxious, on Edge 0 0 0 0  Control/stop worrying 0 0 0 0  Worry too much - different things 0 0 0 0  Trouble relaxing 0 0 0 0  Restless 0 0 0 0  Easily annoyed or irritable 0 0 0 0  Afraid - awful might happen 0 0 0 0  Total GAD 7 Score 0 0 0 0  Anxiety Difficulty Not difficult at all Not difficult at all  Not difficult at all       06/16/2022    3:31 PM 01/28/2022   10:09 AM 08/23/2021    9:44 AM  Depression screen PHQ 2/9  Decreased Interest 0 0 0  Down, Depressed, Hopeless 0 0 0  PHQ - 2 Score 0 0 0  Altered sleeping  0 0  Tired, decreased energy  0 0  Change in appetite  0 0  Feeling bad or failure about yourself   0 0  Trouble concentrating  0 0  Moving slowly or fidgety/restless  0 0  Suicidal thoughts  0 0  PHQ-9 Score  0 0  Difficult doing work/chores  Not difficult at all Not difficult at all    BP Readings from Last 3 Encounters:  09/29/22 122/70  01/28/22 118/70  08/23/21 130/82    Physical Exam Vitals and nursing note reviewed.  Constitutional:      General: She is not in acute distress.    Appearance: She is well-developed.  HENT:     Head: Normocephalic and atraumatic.  Eyes:     Extraocular Movements: Extraocular movements intact.     Pupils: Pupils are equal,  round, and reactive to light.  Cardiovascular:     Rate and Rhythm: Normal rate and regular rhythm.     Heart sounds: No murmur heard. Pulmonary:     Effort: Pulmonary effort is normal. No respiratory distress.     Breath sounds: Decreased breath sounds present. No wheezing or rhonchi.  Musculoskeletal:  General: Normal range of motion.     Cervical back: Normal range of motion and neck supple.  Lymphadenopathy:     Cervical: No cervical adenopathy.  Skin:    General: Skin is warm and dry.     Findings: No rash.  Neurological:     Mental Status: She is alert and oriented to person, place, and time.  Psychiatric:        Mood and Affect: Mood normal.        Behavior: Behavior normal.     Wt Readings from Last 3 Encounters:  09/29/22 117 lb 6.4 oz (53.3 kg)  01/28/22 130 lb 12.8 oz (59.3 kg)  08/23/21 138 lb (62.6 kg)    BP 122/70   Pulse (!) 111   Ht 5\' 4"  (1.626 m)   Wt 117 lb 6.4 oz (53.3 kg)   SpO2 100%   BMI 20.15 kg/m   Assessment and Plan:  Problem List Items Addressed This Visit       Respiratory   Moderate persistent asthma without complication    Suspect recurrence of asthma with shortness of breath and wheezing; not currently treated Begin Advair bid and albuterol as needed      Relevant Medications   fluticasone-salmeterol (ADVAIR) 100-50 MCG/ACT AEPB   albuterol (VENTOLIN HFA) 108 (90 Base) MCG/ACT inhaler     Other   Prediabetes    Concern for overt DM with unintentional weight loss. A1C today is 5.7 so weight loss is likely due to anxiety, chronic shortness of breath, etc      Relevant Orders   POCT glycosylated hemoglobin (Hb A1C) (Completed)   Shortness of breath - Primary   Relevant Orders   EKG 12-Lead (Completed)   Weight loss, unintentional    DM ruled out. Continue efforts with healthy diet Monitor weights at home and call if persistent       No follow-ups on file.   Partially dictated using Dragon software, any errors  are not intentional.  Reubin Milan, MD [facility] Medicine [place], [place]

## 2022-10-07 ENCOUNTER — Other Ambulatory Visit (HOSPITAL_BASED_OUTPATIENT_CLINIC_OR_DEPARTMENT_OTHER): Payer: Self-pay

## 2022-10-07 DIAGNOSIS — H401112 Primary open-angle glaucoma, right eye, moderate stage: Secondary | ICD-10-CM | POA: Diagnosis not present

## 2022-10-07 DIAGNOSIS — H401122 Primary open-angle glaucoma, left eye, moderate stage: Secondary | ICD-10-CM | POA: Diagnosis not present

## 2022-10-07 MED ORDER — LATANOPROST 0.005 % OP SOLN
1.0000 [drp] | Freq: Every evening | OPHTHALMIC | 12 refills | Status: DC
  Filled 2022-10-07: qty 5, 50d supply, fill #0
  Filled 2022-11-17: qty 5, 100d supply, fill #0
  Filled 2022-12-18: qty 5, 50d supply, fill #0
  Filled 2023-02-11: qty 5, 50d supply, fill #1
  Filled 2023-04-01: qty 5, 50d supply, fill #2
  Filled 2023-06-28: qty 5, 50d supply, fill #3
  Filled 2023-08-26: qty 5, 50d supply, fill #4
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
## 2022-10-13 ENCOUNTER — Other Ambulatory Visit (HOSPITAL_COMMUNITY): Payer: Self-pay

## 2022-10-13 ENCOUNTER — Encounter: Payer: Self-pay | Admitting: Internal Medicine

## 2022-10-13 ENCOUNTER — Other Ambulatory Visit: Payer: Self-pay | Admitting: Internal Medicine

## 2022-10-13 DIAGNOSIS — J4540 Moderate persistent asthma, uncomplicated: Secondary | ICD-10-CM

## 2022-10-13 MED ORDER — FLUTICASONE-SALMETEROL 250-50 MCG/ACT IN AEPB
1.0000 | INHALATION_SPRAY | Freq: Two times a day (BID) | RESPIRATORY_TRACT | 5 refills | Status: DC
Start: 2022-10-13 — End: 2023-07-07
  Filled 2022-10-13: qty 60, 30d supply, fill #0
  Filled 2022-11-10: qty 60, 30d supply, fill #1
  Filled 2022-12-18: qty 60, 30d supply, fill #2
  Filled 2023-01-20: qty 60, 30d supply, fill #3
  Filled 2023-02-17: qty 60, 30d supply, fill #4
  Filled 2023-03-22: qty 60, 30d supply, fill #5

## 2022-10-15 ENCOUNTER — Telehealth (INDEPENDENT_AMBULATORY_CARE_PROVIDER_SITE_OTHER): Payer: PPO | Admitting: Psychiatry

## 2022-10-15 ENCOUNTER — Encounter: Payer: Self-pay | Admitting: Psychiatry

## 2022-10-15 DIAGNOSIS — F419 Anxiety disorder, unspecified: Secondary | ICD-10-CM | POA: Diagnosis not present

## 2022-10-15 DIAGNOSIS — F3341 Major depressive disorder, recurrent, in partial remission: Secondary | ICD-10-CM

## 2022-11-13 ENCOUNTER — Other Ambulatory Visit: Payer: Self-pay

## 2022-11-17 ENCOUNTER — Other Ambulatory Visit: Payer: Self-pay
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
## 2022-11-26 ENCOUNTER — Telehealth (INDEPENDENT_AMBULATORY_CARE_PROVIDER_SITE_OTHER): Payer: PPO | Admitting: Psychiatry

## 2022-11-26 ENCOUNTER — Other Ambulatory Visit (HOSPITAL_COMMUNITY): Payer: Self-pay

## 2022-11-26 ENCOUNTER — Encounter: Payer: Self-pay | Admitting: Psychiatry

## 2022-11-26 DIAGNOSIS — F419 Anxiety disorder, unspecified: Secondary | ICD-10-CM | POA: Diagnosis not present

## 2022-11-26 DIAGNOSIS — F3341 Major depressive disorder, recurrent, in partial remission: Secondary | ICD-10-CM | POA: Diagnosis not present

## 2022-11-26 MED ORDER — BUPROPION HCL ER (XL) 150 MG PO TB24
150.0000 mg | ORAL_TABLET | Freq: Every day | ORAL | 1 refills | Status: DC
  Filled 2022-11-26 – 2022-12-31 (×2): qty 90, 90d supply, fill #0
  Filled 2023-04-01: qty 90, 90d supply, fill #1

## 2022-12-18 ENCOUNTER — Other Ambulatory Visit (HOSPITAL_COMMUNITY): Payer: Self-pay

## 2022-12-31 ENCOUNTER — Other Ambulatory Visit (HOSPITAL_COMMUNITY): Payer: Self-pay
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
## 2023-01-21 ENCOUNTER — Encounter: Payer: Self-pay | Admitting: Psychiatry

## 2023-01-21 ENCOUNTER — Telehealth (INDEPENDENT_AMBULATORY_CARE_PROVIDER_SITE_OTHER): Payer: PPO | Admitting: Psychiatry

## 2023-01-21 DIAGNOSIS — F419 Anxiety disorder, unspecified: Secondary | ICD-10-CM | POA: Diagnosis not present

## 2023-01-21 DIAGNOSIS — F3341 Major depressive disorder, recurrent, in partial remission: Secondary | ICD-10-CM

## 2023-01-21 NOTE — Patient Instructions (Signed)
Continue venlafaxine 225 mg daily   Continue bupropion 450 mg daily Next appointment- 10/30 at 10 30

## 2023-02-02 ENCOUNTER — Telehealth: Payer: Self-pay | Admitting: Internal Medicine

## 2023-02-02 ENCOUNTER — Encounter: Payer: PPO | Admitting: Internal Medicine

## 2023-02-02 ENCOUNTER — Other Ambulatory Visit: Payer: Self-pay | Admitting: Internal Medicine

## 2023-02-02 DIAGNOSIS — Z1211 Encounter for screening for malignant neoplasm of colon: Secondary | ICD-10-CM

## 2023-02-02 NOTE — Assessment & Plan Note (Deleted)
Continue diet changes Lab Results  Component Value Date   HGBA1C 5.7 (A) 09/29/2022

## 2023-02-02 NOTE — Assessment & Plan Note (Deleted)
Symptoms improved on Advair bid and PRN albuterol

## 2023-02-02 NOTE — Assessment & Plan Note (Deleted)
Followed by Psych On Effexor and Wellbutrin

## 2023-02-02 NOTE — Telephone Encounter (Signed)
Copied from CRM [id number]. Topic: General - Inquiry >> Feb 02, 2023  1:31 PM De Blanch wrote: Reason for CRM: Pt missed her physical today and it was rescheduled for next available 07/07/2023; however, pt asked if it was possible for a Cologuard kit to be mailed to her home.  Please advise.

## 2023-02-02 NOTE — Telephone Encounter (Signed)
Please review.  KP

## 2023-02-02 NOTE — Progress Notes (Deleted)
Date:  02/02/2023   Name:  Samantha Barton   DOB:  [birth year]   MRN:  [id number]   Chief Complaint: No chief complaint on file. Samantha Barton is a 72 y.o. female who presents today for her Complete Annual Exam. She feels {DESC; WELL/FAIRLY WELL/POORLY:18703}. She reports exercising ***. She reports she is sleeping {DESC; WELL/FAIRLY WELL/POORLY:18703}. Breast complaints ***.  Mammogram: 07/2022 DEXA: 07/2022 osteopenia Colonoscopy: Cologuard 05/2019  Health Maintenance Due  Topic Date Due   Fecal DNA (Cologuard)  06/06/2022   INFLUENZA VACCINE  12/25/2022   COVID-19 Vaccine (4 - 2023-24 season) 01/25/2023    Immunization History  Administered Date(s) Administered   Fluad Quad(high Dose 65+) 01/24/2021   Influenza, High Dose Seasonal PF 03/17/2018, 02/07/2019, 03/04/2020   Influenza,inj,Quad PF,6+ Mos 02/19/2017, 01/28/2022   Influenza-Unspecified 02/08/2016, 02/07/2019   PFIZER(Purple Top)SARS-COV-2 Vaccination 07/04/2019, 08/01/2019, 04/08/2020   Pneumococcal Conjugate-13 03/12/2016   Pneumococcal Polysaccharide-23 05/27/2009, 12/15/2017   Tdap 05/27/2013   Zoster Recombinant(Shingrix) 04/29/2021, 08/27/2021   Zoster, Live 05/27/2012    HPI  Lab Results  Component Value Date   NA 140 01/28/2022   K 4.8 01/28/2022   CO2 26 01/28/2022   GLUCOSE 81 01/28/2022   BUN 25 01/28/2022   CREATININE 1.17 (H) 01/28/2022   CALCIUM 10.8 (H) 01/28/2022   EGFR 50 (L) 01/28/2022   GFRNONAA 56 (L) 12/21/2019   Lab Results  Component Value Date   CHOL 268 (H) 01/28/2022   HDL 90 01/28/2022   LDLCALC 159 (H) 01/28/2022   TRIG 114 01/28/2022   CHOLHDL 3.0 01/28/2022   Lab Results  Component Value Date   TSH 1.630 01/28/2022   Lab Results  Component Value Date   HGBA1C 5.7 (A) 09/29/2022   Lab Results  Component Value Date   WBC 5.6 01/28/2022   HGB 13.4 01/28/2022   HCT 38.4 01/28/2022   MCV 97 01/28/2022   PLT 298 01/28/2022   Lab Results  Component Value Date    ALT 15 01/28/2022   AST 22 01/28/2022   ALKPHOS 89 01/28/2022   BILITOT 0.3 01/28/2022   Lab Results  Component Value Date   VD25OH 35.6 12/21/2019     Review of Systems  Patient Active Problem List   Diagnosis Date Noted   Moderate persistent asthma without complication 09/29/2022   Weight loss, unintentional 09/29/2022   Prediabetes 03/17/2022   Shortness of breath 12/16/2018   Recurrent major depressive disorder, in full remission (HCC) 06/24/2018   Osteopenia determined by x-ray 03/19/2016   Hypertriglyceridemia 01/14/2015    No Known Allergies  Past Surgical History:  Procedure Laterality Date   BREAST BIOPSY Right 1998   bx/clip-neg   FRACTURE SURGERY  2017   JOINT REPLACEMENT  08/10/2019   ORBITAL FRACTURE SURGERY  04/25/2015   ORIF ORBITAL FRACTURE Right 04/25/2015   Procedure: OPEN REDUCTION INTERNAL FIXATION (ORIF) ORBITAL FRACTURE;  Surgeon: Geanie Logan, MD;  Location: [facility] ORS;  Service: ENT;  Laterality: Right;   SIGMOIDOSCOPY     TOTAL HIP ARTHROPLASTY Left 08/10/2019    Social History   Tobacco Use   Smoking status: Never   Smokeless tobacco: Never   Tobacco comments:    smoking cessation materials not required  Vaping Use   Vaping status: Never Used  Substance Use Topics   Alcohol use: No    Alcohol/week: 0.0 standard drinks of alcohol   Drug use: No     Medication list has been reviewed and updated.  No  outpatient medications have been marked as taking for the 02/02/23 encounter (Appointment) with Reubin Milan, MD.       09/29/2022    4:29 PM 01/28/2022   10:09 AM 08/23/2021    9:44 AM 01/24/2021    9:42 AM  GAD 7 : Generalized Anxiety Score  Nervous, Anxious, on Edge 0 0 0 0  Control/stop worrying 0 0 0 0  Worry too much - different things 0 0 0 0  Trouble relaxing 0 0 0 0  Restless 0 0 0 0  Easily annoyed or irritable 0 0 0 0  Afraid - awful might happen 0 0 0 0  Total GAD 7 Score 0 0 0 0  Anxiety Difficulty Not difficult at  all Not difficult at all Not difficult at all        09/29/2022    4:29 PM 06/16/2022    3:31 PM 01/28/2022   10:09 AM  Depression screen PHQ 2/9  Decreased Interest 0 0 0  Down, Depressed, Hopeless 0 0 0  PHQ - 2 Score 0 0 0  Altered sleeping 0  0  Tired, decreased energy 0  0  Change in appetite 0  0  Feeling bad or failure about yourself  0  0  Trouble concentrating 0  0  Moving slowly or fidgety/restless 0  0  Suicidal thoughts 0  0  PHQ-9 Score 0  0  Difficult doing work/chores Not difficult at all  Not difficult at all    BP Readings from Last 3 Encounters:  09/29/22 122/70  01/28/22 118/70  08/23/21 130/82    Physical Exam  Wt Readings from Last 3 Encounters:  09/29/22 117 lb 6.4 oz (53.3 kg)  01/28/22 130 lb 12.8 oz (59.3 kg)  08/23/21 138 lb (62.6 kg)    There were no vitals taken for this visit.  Assessment and Plan:  Problem List Items Addressed This Visit   None   No follow-ups on file.    Reubin Milan, MD [facility] Care and [facility] [place]

## 2023-02-02 NOTE — Assessment & Plan Note (Deleted)
Lipids managed with diet and exercise.

## 2023-02-02 NOTE — Assessment & Plan Note (Deleted)
Repeat DEXA this year showed slight worsening She will continue on Calcium and Vitamin D

## 2023-02-05 ENCOUNTER — Ambulatory Visit (INDEPENDENT_AMBULATORY_CARE_PROVIDER_SITE_OTHER): Payer: PPO

## 2023-02-05 ENCOUNTER — Ambulatory Visit (HOSPITAL_COMMUNITY)
Admission: EM | Admit: 2023-02-05 | Discharge: 2023-02-05 | Disposition: A | Discharge status: Home / Self Care | Payer: PPO | Attending: Family Medicine | Admitting: Family Medicine

## 2023-02-05 ENCOUNTER — Telehealth (HOSPITAL_COMMUNITY): Payer: Self-pay

## 2023-02-05 ENCOUNTER — Encounter (HOSPITAL_COMMUNITY): Payer: Self-pay

## 2023-02-05 DIAGNOSIS — M79672 Pain in left foot: Secondary | ICD-10-CM

## 2023-02-05 DIAGNOSIS — M7732 Calcaneal spur, left foot: Secondary | ICD-10-CM | POA: Diagnosis not present

## 2023-02-05 NOTE — Telephone Encounter (Signed)
Left message to return call 
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
## 2023-02-05 NOTE — Discharge Instructions (Signed)
Continue taking ibuprofen as needed for pain. Ice on foot tonight and tomorrow may help.
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
## 2023-02-10 ENCOUNTER — Other Ambulatory Visit: Payer: Self-pay | Admitting: Internal Medicine

## 2023-02-10 ENCOUNTER — Other Ambulatory Visit (HOSPITAL_COMMUNITY): Payer: Self-pay

## 2023-02-10 ENCOUNTER — Ambulatory Visit: Payer: PPO | Admitting: Podiatry

## 2023-02-10 ENCOUNTER — Encounter: Payer: Self-pay | Admitting: Podiatry

## 2023-02-10 DIAGNOSIS — S93325A Dislocation of tarsometatarsal joint of left foot, initial encounter: Secondary | ICD-10-CM | POA: Diagnosis not present

## 2023-02-10 DIAGNOSIS — F3342 Major depressive disorder, recurrent, in full remission: Secondary | ICD-10-CM

## 2023-02-10 DIAGNOSIS — Z1211 Encounter for screening for malignant neoplasm of colon: Secondary | ICD-10-CM | POA: Diagnosis not present

## 2023-02-10 MED ORDER — VENLAFAXINE HCL ER 150 MG PO CP24
150.0000 mg | ORAL_CAPSULE | Freq: Every day | ORAL | 3 refills | Status: DC
  Filled 2023-02-10 – 2023-03-09 (×2): qty 90, 90d supply, fill #0
  Filled 2023-06-04: qty 90, 90d supply, fill #1
  Filled 2023-09-05: qty 90, 90d supply, fill #2
  Filled 2023-12-05: qty 90, 90d supply, fill #3

## 2023-02-10 NOTE — ED Provider Notes (Signed)
[facility] CARE CENTER   [id number] 02/05/23 Arrival Time: 1509  ASSESSMENT & PLAN:  1. Left foot pain     I have personally viewed and independently interpreted the imaging studies ordered this visit. L foot: ques Lisfranc injury; agree with radiology report. Pt notified by CMA to arrange f/u with [facility].  DG Foot Complete Left  Result Date: 02/05/2023 CLINICAL DATA:  Left foot pain after fall from a horse EXAM: LEFT FOOT - COMPLETE 3+ VIEW COMPARISON:  None Available. FINDINGS: Mild spurring of the first metatarsal head laterally. Bifid medial and lateral sesamoid, medial sesamoid gap up to 3 mm, correlate with any point tenderness over the first digit sesamoids in determining whether this may be acute versus chronic separation. There is a linear lucency at the medial base of the second metatarsal. I cannot exclude a fracture in the vicinity of the attachment site of the Lisfranc ligament. Although some of this lucency may be artifactual related to the configuration of the medial cuneiform, CT of the foot with attention to the Lisfranc joint is recommended. There is no overt malalignment currently at the Lisfranc joint. Plantar calcaneal spur noted. IMPRESSION: 1. Linear lucency at the medial base of the second metatarsal, potentially a fracture in the vicinity of the attachment site of the Lisfranc ligament. CT of the foot with attention to the Lisfranc joint is recommended to definitively characterize. 2. Bifid medial and lateral sesamoid, medial sesamoid gap up to 3 mm, correlate with any point tenderness over the first digit sesamoids in determining whether this may be acute versus chronic separation. 3. Plantar calcaneal spur. Electronically Signed   By: Gaylyn Rong M.D.   On: 02/05/2023 18:43    Prefers OTC analgesics.  Orders Placed This Encounter  Procedures   DG Foot Complete Left   Apply CAM boot   Recommend:  Follow-up Information     Schedule an  appointment as soon as possible for a visit  with [facility]).   Why: If worsening or failing to improve as anticipated. Contact information: [address] [place],  [place]  [address]  [phone]               Reviewed expectations re: course of current medical issues. Questions answered. Outlined signs and symptoms indicating need for more acute intervention. Patient verbalized understanding. After Visit Summary given.  SUBJECTIVE: History from: patient. Samantha Barton is a 72 y.o. female who reports LEFT foot pain; today; thrown from horse x 2. Trouble bearing wt. Mild swelling. No extremity sensation changes or weakness. 800mg  ibuprofen with some relief.   Past Surgical History:  Procedure Laterality Date   BREAST BIOPSY Right 1998   bx/clip-neg   FRACTURE SURGERY  2017   JOINT REPLACEMENT  08/10/2019   ORBITAL FRACTURE SURGERY  04/25/2015   ORIF ORBITAL FRACTURE Right 04/25/2015   Procedure: OPEN REDUCTION INTERNAL FIXATION (ORIF) ORBITAL FRACTURE;  Surgeon: Geanie Logan, MD;  Location: [facility] ORS;  Service: ENT;  Laterality: Right;   SIGMOIDOSCOPY     TOTAL HIP ARTHROPLASTY Left 08/10/2019      OBJECTIVE:  Vitals:   02/05/23 1604  BP: 119/77  Pulse: 91  Resp: 18  Temp: 98.2 F (36.8 C)  TempSrc: Oral  SpO2: 97%  Height: 5\' 4"  (1.626 m)    General appearance: alert; no distress HEENT: [place]; AT Neck: supple with FROM Resp: unlabored respirations Extremities: LLE: warm with well perfused appearance; poorly localized marked tenderness over  left dorsal mid to distal foot; without gross deformities; swelling: moderate; bruising: minimal; ankle and toes with FROM but with reported pain CV: brisk extremity capillary refill of LLE; 1+ DP pulse of LLE. 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   Fracture, orbital (HCC) 06/2015   Right Eye -Horse ACCIDENT   Hand fracture, right 06/2015   Horse Accident    Hypertriglyceridemia    Social History   Socioeconomic History   Marital status: Single    Spouse name: Not on file   Number of children: 0   Years of education: Not on file   Highest education level: Master's degree (e.g., MA, MS, MEng, MEd, MSW, MBA)  Occupational History   Occupation: Retired  Tobacco Use   Smoking status: Never   Smokeless tobacco: Never   Tobacco comments:    smoking cessation materials not required  Vaping Use   Vaping status: Never Used  Substance and Sexual Activity   Alcohol use: No    Alcohol/week: 0.0 standard drinks of alcohol   Drug use: No   Sexual activity: Not Currently    Birth control/protection: Post-menopausal  Other Topics Concern   Not on file  Social History Narrative   Not on file   Social Determinants of Health   Financial Resource Strain: Low Risk  (09/28/2022)   Overall Financial Resource Strain (CARDIA)    Difficulty of Paying Living Expenses: Not hard at all  Food Insecurity: No Food Insecurity (09/28/2022)   Hunger Vital Sign    Worried About Running Out of Food in the Last Year: Never true    Ran Out of Food in the Last Year: Never true  Transportation Needs: No Transportation Needs (09/28/2022)   PRAPARE - Administrator, Civil Service (Medical): No    Lack of Transportation (Non-Medical): No  Physical Activity: Unknown (09/28/2022)   Exercise Vital Sign    Days of Exercise per Week: Patient declined    Minutes of Exercise per Session: 40 min  Stress: No Stress Concern Present (09/28/2022)   [employer] of Occupational Health - Occupational Stress Questionnaire    Feeling of Stress : Only a little  Social Connections: Unknown (09/28/2022)   Social Connection and Isolation Panel  [NHANES]    Frequency of Communication with Friends and Family: Patient declined    Frequency of Social Gatherings with Friends and Family: Patient declined    Attends Religious Services: Never    Database administrator or Organizations: No    Attends Engineer, structural: Never    Marital Status: Divorced   Family History  Problem Relation Age of Onset   Diabetes Father    Healthy Mother    Breast cancer Neg Hx    Past Surgical History:  Procedure Laterality Date   BREAST BIOPSY Right 1998   bx/clip-neg   FRACTURE SURGERY  2017   JOINT REPLACEMENT  08/10/2019   ORBITAL FRACTURE SURGERY  04/25/2015   ORIF ORBITAL FRACTURE Right 04/25/2015   Procedure: OPEN REDUCTION INTERNAL FIXATION (ORIF) ORBITAL FRACTURE;  Surgeon: Geanie Logan, MD;  Location: [facility] ORS;  Service: ENT;  Laterality: Right;   SIGMOIDOSCOPY     TOTAL HIP ARTHROPLASTY Left 08/10/2019  Mardella Layman, MD 02/10/23 1045

## 2023-02-11 ENCOUNTER — Other Ambulatory Visit (HOSPITAL_COMMUNITY): Payer: Self-pay

## 2023-02-11 ENCOUNTER — Other Ambulatory Visit: Payer: Self-pay | Admitting: Internal Medicine

## 2023-02-11 DIAGNOSIS — F3342 Major depressive disorder, recurrent, in full remission: Secondary | ICD-10-CM

## 2023-02-11 MED ORDER — VENLAFAXINE HCL ER 75 MG PO CP24
75.0000 mg | ORAL_CAPSULE | Freq: Every day | ORAL | 3 refills | Status: DC
Start: 2023-02-11 — End: 2024-02-04
  Filled 2023-02-11: qty 90, 90d supply, fill #0
  Filled 2023-05-08: qty 90, 90d supply, fill #1
  Filled 2023-08-08: qty 90, 90d supply, fill #2
  Filled 2023-11-06: qty 90, 90d supply, fill #3

## 2023-02-11 NOTE — Progress Notes (Signed)
She presents today for follow-up of a injury sustained from being thrown by a horse this past Saturday she went to the emergency department they x-rayed and states that it might be a Lisfranc's fracture and would like for [place] to take a look at it.  She states that while in the boot it does not hurt as bad.  Objective: Vital signs stable alert oriented x 3.  Pulses are palpable.  Boot was removed demonstrates ecchymosis to the foot minimal edema she does have pain on direct palpation at the base of the second metatarsal and the first intermetatarsal space.  This does not appear to be unstable.  Radiographs were reviewed demonstrating what appears to be an avulsion on the plantar aspect of the second metatarsal base as well as an oblique fracture along the second metatarsal base medially.  These were radiographs taken at the emergency department.  Assessment: Lisfranc's fracture second met base left  Plan: Continue the use of the cam boot for another 6 to 8 weeks for another set of x-rays.

## 2023-02-17 ENCOUNTER — Other Ambulatory Visit: Payer: Self-pay

## 2023-02-17 ENCOUNTER — Other Ambulatory Visit: Payer: Self-pay | Admitting: Psychiatry

## 2023-02-17 ENCOUNTER — Ambulatory Visit: Payer: Self-pay | Admitting: *Deleted

## 2023-02-17 DIAGNOSIS — Z1211 Encounter for screening for malignant neoplasm of colon: Secondary | ICD-10-CM

## 2023-02-17 LAB — COLOGUARD: COLOGUARD: POSITIVE — AB

## 2023-02-17 NOTE — Telephone Encounter (Signed)
Reason for Disposition  [1] Follow-up call to recent contact AND [2] information only call, no triage required  Answer Assessment - Initial Assessment Questions 1. REASON FOR CALL or QUESTION: "What is your reason for calling today?" or "How can I best help you?" or "What question do you have that I can help answer?"     Returned call for Cologuard result.   It's positive so Dr. Jerrell Belfast has put in for a GI consult for a colonoscopy.  Protocols used: Information Only Call - No Triage-A-AH

## 2023-02-17 NOTE — Progress Notes (Signed)
LVM to call back. PEC may give restuls, CRM created

## 2023-02-17 NOTE — Telephone Encounter (Signed)
  Chief Complaint: Pt given lab results per notes of Dr. Judithann Graves on 02/17/2023 at 10:50 AM.   Pt verbalized understanding.  Symptoms: N/A Frequency: N/A Pertinent Negatives: Patient denies N/A Disposition: [] ED /[] Urgent Care (no appt availability in office) / [] Appointment(In office/virtual)/ []  [place] [facility]/ [] Home Care/ [] Refused Recommended Disposition /[] [place] [facility]/ [x]  Follow-up with PCP Additional Notes: I let her know the GI office would be calling her to set up an appt for her colonoscopy.

## 2023-02-18 ENCOUNTER — Other Ambulatory Visit (HOSPITAL_COMMUNITY): Payer: Self-pay

## 2023-02-18 ENCOUNTER — Encounter (HOSPITAL_COMMUNITY): Payer: Self-pay

## 2023-02-21 ENCOUNTER — Other Ambulatory Visit: Payer: Self-pay | Admitting: Psychiatry

## 2023-02-23 ENCOUNTER — Other Ambulatory Visit (HOSPITAL_COMMUNITY): Payer: Self-pay

## 2023-02-25 ENCOUNTER — Other Ambulatory Visit: Payer: Self-pay | Admitting: Psychiatry

## 2023-02-25 ENCOUNTER — Other Ambulatory Visit (HOSPITAL_COMMUNITY): Payer: Self-pay

## 2023-02-26 ENCOUNTER — Other Ambulatory Visit (HOSPITAL_COMMUNITY): Payer: Self-pay

## 2023-02-26 ENCOUNTER — Encounter: Payer: Self-pay | Admitting: Internal Medicine

## 2023-02-26 ENCOUNTER — Other Ambulatory Visit: Payer: Self-pay | Admitting: Psychiatry

## 2023-02-26 MED ORDER — BUPROPION HCL ER (XL) 300 MG PO TB24
300.0000 mg | ORAL_TABLET | Freq: Every day | ORAL | 1 refills | Status: DC
  Filled 2023-02-26: qty 90, 90d supply, fill #0
  Filled 2023-05-23: qty 90, 90d supply, fill #1

## 2023-02-26 NOTE — Telephone Encounter (Signed)
Please review.  KP

## 2023-03-09 ENCOUNTER — Other Ambulatory Visit (HOSPITAL_COMMUNITY): Payer: Self-pay

## 2023-03-18 ENCOUNTER — Other Ambulatory Visit (HOSPITAL_COMMUNITY): Payer: Self-pay

## 2023-03-18 ENCOUNTER — Ambulatory Visit: Payer: PPO

## 2023-03-18 ENCOUNTER — Encounter: Payer: Self-pay | Admitting: Gastroenterology

## 2023-03-18 VITALS — Ht 64.0 in | Wt 117.0 lb

## 2023-03-18 DIAGNOSIS — Z1211 Encounter for screening for malignant neoplasm of colon: Secondary | ICD-10-CM

## 2023-03-18 MED ORDER — ONDANSETRON HCL 4 MG PO TABS
4.0000 mg | ORAL_TABLET | Freq: Two times a day (BID) | ORAL | 0 refills | Status: DC | PRN
Start: 2023-03-18 — End: 2023-07-07
  Filled 2023-03-18: qty 4, 2d supply, fill #0

## 2023-03-18 MED ORDER — NA SULFATE-K SULFATE-MG SULF 17.5-3.13-1.6 GM/177ML PO SOLN
1.0000 | Freq: Once | ORAL | 0 refills | Status: AC
Start: 2023-03-18 — End: 2023-03-21
  Filled 2023-03-18: qty 354, 1d supply, fill #0

## 2023-03-18 NOTE — Progress Notes (Signed)
Virtual Visit via Video Note  I connected with Samantha Barton on 03/25/23 at 10:30 AM EDT by a video enabled telemedicine application and verified that I am speaking with the correct person using two identifiers.  Location: Patient: home Provider: office Persons participated in the visit- patient, provider    I discussed the limitations of evaluation and management by telemedicine and the availability of in person appointments. The patient expressed understanding and agreed to proceed.   I discussed the assessment and treatment plan with the patient. The patient was provided an opportunity to ask questions and all were answered. The patient agreed with the plan and demonstrated an understanding of the instructions.   The patient was advised to call back or seek an in-person evaluation if the symptoms worsen or if the condition fails to improve as anticipated.  I provided 30 minutes of non-face-to-face time during this encounter.   Neysa Hotter, MD    [facility] MD/PA/NP OP Progress Note  03/25/2023 12:12 PM Samantha Barton  MRN:  [id number]  Chief Complaint: No chief complaint on file.  HPI:  This is a follow-up appointment for depression and anxiety.  She states that she is not doing well.  She does not leave the house.  She tends to stay in the bed until 2:00.  She had a fracture on her foot, falling from horseback riding.  She wears a boot.  She agrees that she feels very lonely.  She had to arrange to bring her to the appointment for colonoscopy.  She will recognize that although she used to have friends in other state, she does not have any.  She has limited contact with her sister.  She feels overwhelmed.  Although she sleeps up to 7 hours, she does not feel she had good sleep.  She thinks she has being experiencing symptoms of depression, in addition to feeling overwhelmed.  However, she is concerned of adding another medication as she is already on many medications for her physical  health.  She is willing to try therapy to get through this challenge.  She denies SI.   Wt Readings from Last 3 Encounters:  03/18/23 117 lb (53.1 kg)  09/29/22 117 lb 6.4 oz (53.3 kg)  01/28/22 130 lb 12.8 oz (59.3 kg)     Visit Diagnosis:    ICD-10-CM   1. MDD (major depressive disorder), recurrent episode, mild (HCC)  F33.0     2. Anxiety disorder, unspecified type  F41.9       Past Psychiatric History: Please see initial evaluation for full details. I have reviewed the history. No updates at this time.     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   Fracture, orbital (HCC) 06/2015   Right Eye -Horse ACCIDENT   Hand fracture, right 06/2015   Horse Accident    Hypertriglyceridemia     Past Surgical History:  Procedure Laterality Date   BREAST BIOPSY Right 1998   bx/clip-neg   FRACTURE SURGERY  2017   JOINT REPLACEMENT  08/10/2019   ORBITAL FRACTURE SURGERY  04/25/2015   ORIF ORBITAL FRACTURE Right 04/25/2015   Procedure: OPEN REDUCTION INTERNAL FIXATION (ORIF) ORBITAL FRACTURE;  Surgeon: Geanie Logan, MD;  Location: [facility] ORS;  Service: ENT;  Laterality: Right;   SIGMOIDOSCOPY     TOTAL HIP ARTHROPLASTY Left 08/10/2019    Family Psychiatric History:  Please see initial evaluation for full details. I have reviewed the history. No updates at this time.     Family History:  Family History  Problem Relation Age of Onset   Healthy Mother    Diabetes Father    Breast cancer Neg Hx    Colon cancer Neg Hx    Rectal cancer Neg Hx    Stomach cancer Neg Hx    Esophageal cancer Neg Hx     Social History:  Social History   Socioeconomic History   Marital status: Single    Spouse name: Not on file   Number of children: 0   Years of education: Not on file   Highest education level: Master's degree (e.g., MA, MS, MEng, MEd, MSW, MBA)  Occupational History    Occupation: Retired  Tobacco Use   Smoking status: Never   Smokeless tobacco: Never   Tobacco comments:    smoking cessation materials not required  Vaping Use   Vaping status: Never Used  Substance and Sexual Activity   Alcohol use: No    Alcohol/week: 0.0 standard drinks of alcohol   Drug use: No   Sexual activity: Not Currently    Birth control/protection: Post-menopausal  Other Topics Concern   Not on file  Social History Narrative   Not on file   Social Determinants of Health   Financial Resource Strain: Low Risk  (09/28/2022)   Overall Financial Resource Strain (CARDIA)    Difficulty of Paying Living Expenses: Not hard at all  Food Insecurity: No Food Insecurity (09/28/2022)   Hunger Vital Sign    Worried About Running Out of Food in the Last Year: Never true    Ran Out of Food in the Last Year: Never true  Transportation Needs: No Transportation Needs (09/28/2022)   PRAPARE - Administrator, Civil Service (Medical): No    Lack of Transportation (Non-Medical): No  Physical Activity: Unknown (09/28/2022)   Exercise Vital Sign    Days of Exercise per Week: Patient declined    Minutes of Exercise per Session: 40 min  Stress: No Stress Concern Present (09/28/2022)   [employer] of Occupational Health - Occupational Stress Questionnaire    Feeling of Stress : Only a little  Social Connections: Unknown (09/28/2022)   Social Connection and Isolation Panel [NHANES]    Frequency of Communication with Friends and Family: Patient declined    Frequency of Social Gatherings with Friends and Family: Patient declined    Attends Religious Services: Never    Database administrator or Organizations: No    Attends Engineer, structural: Never    Marital Status: Divorced    Allergies: No Known Allergies  Metabolic Disorder Labs: Lab Results  Component Value Date   HGBA1C 5.7 (A) 09/29/2022   No results found for: "PROLACTIN" Lab Results  Component Value  Date   CHOL 268 (H) 01/28/2022   TRIG 114 01/28/2022   HDL 90 01/28/2022   CHOLHDL 3.0 01/28/2022   LDLCALC 159 (H) 01/28/2022   LDLCALC 142 (H) 01/24/2021   Lab Results  Component Value Date   TSH 1.630 01/28/2022   TSH 1.370 08/23/2021    Therapeutic Level Labs: No results found for: "LITHIUM" No results found for: "VALPROATE" No results found for: "CBMZ"  Current Medications: Current Outpatient Medications  Medication Sig Dispense Refill   albuterol (VENTOLIN HFA) 108 (90 Base) MCG/ACT inhaler Inhale 2 puffs into the lungs every 6 (six) hours as needed for wheezing  or shortness of breath. 6.7 g 3   buPROPion (WELLBUTRIN XL) 150 MG 24 hr tablet Take 1 tablet (150 mg total) by mouth daily.Take along with 300 mg tab for a total of 450 mg daily. 90 tablet 1   buPROPion (WELLBUTRIN XL) 300 MG 24 hr tablet Take 1 tablet (300 mg total) by mouth daily. (Take along with 150 mg tab for a total daily dose of 450mg ). 90 tablet 1   Cholecalciferol (VITAMIN D) 50 MCG (2000 UT) CAPS      fluticasone-salmeterol (ADVAIR) 250-50 MCG/ACT AEPB Inhale 1 puff into the lungs in the morning and at bedtime. 60 each 5   latanoprost (XALATAN) 0.005 % ophthalmic solution Place 1 drop into both eyes every evening. 5 mL 12   ondansetron (ZOFRAN) 4 MG tablet Take 1 tablet (4 mg total) by mouth every 12 (twelve) hours as needed for nausea or vomiting. May take 1 tablet before colonoscopy prep the day before and day of the procedure. 4 tablet 0   venlafaxine XR (EFFEXOR-XR) 150 MG 24 hr capsule Take 1 capsule (150 mg total) by mouth daily. 90 capsule 3   venlafaxine XR (EFFEXOR-XR) 75 MG 24 hr capsule Take 1 capsule (75 mg total) by mouth daily. 90 capsule 3   No current facility-administered medications for this visit.     Musculoskeletal: Strength & Muscle Tone:  N/A Gait & Station:  N/A Patient leans: N/A  Psychiatric Specialty Exam: Review of Systems  Psychiatric/Behavioral:  Positive for dysphoric  mood and sleep disturbance. Negative for agitation, behavioral problems, confusion, decreased concentration, hallucinations, self-injury and suicidal ideas. The patient is not nervous/anxious and is not hyperactive.   All other systems reviewed and are negative.   There were no vitals taken for this visit.There is no height or weight on file to calculate BMI.  General Appearance: Well Groomed  Eye Contact:  Good  Speech:  Clear and Coherent  Volume:  Normal  Mood:  Depressed  Affect:  Appropriate, Congruent, and slightly down  Thought Process:  Coherent  Orientation:  Full (Time, Place, and Person)  Thought Content: Logical   Suicidal Thoughts:  No  Homicidal Thoughts:  No  Memory:  Immediate;   Good  Judgement:  Good  Insight:  Good  Psychomotor Activity:  Normal  Concentration:  Concentration: Good and Attention Span: Good  Recall:  Good  Fund of Knowledge: Good  Language: Good  Akathisia:  No  Handed:  Right  AIMS (if indicated): not done  Assets:  Communication Skills Desire for Improvement  ADL's:  Intact  Cognition: WNL  Sleep:  Poor   Screenings: GAD-7    Flowsheet Row Office Visit from 09/29/2022 in [facility] Primary Care & Sports Medicine at [facility] Office Visit from 01/28/2022 in [facility] Primary Care & Sports Medicine at [facility] Office Visit from 08/23/2021 in [facility] Primary Care & Sports Medicine at [facility] Office Visit from 01/24/2021 in [facility] Primary Care & Sports Medicine at [facility] Office Visit from 12/21/2019 in [facility] Primary Care & Sports Medicine at [facility]  Total GAD-7 Score 0 0 0 0 0      PHQ2-9    Flowsheet Row Office Visit from 09/29/2022 in [facility] Primary Care & Sports Medicine at [facility] Clinical Support from 06/16/2022 in [facility] Primary Care & Sports Medicine at [facility] Office Visit from 01/28/2022 in [facility] Primary Care & Sports Medicine at [facility] Visit  from 08/23/2021 in [facility] Primary Care & Sports Medicine at [facility] Video Visit from 06/03/2021 in [facility]  PHQ-2 Total Score 0 0 0 0 1  PHQ-9 Total Score 0 -- 0 0 1      Flowsheet Row ED from 02/05/2023 in [facility] at [facility] RISK CATEGORY No Risk        Assessment and Plan:  Samantha Barton is a 72 y.o. year old female with a history of depression, arthritis, who presents for follow up appointment for below.    1. MDD (major depressive disorder), recurrent episode, mild (HCC) 2. Anxiety disorder, unspecified type Acute stressors include: loss of her mother May 2024, limited contact with her sister in [place], foot fracture Other stressors include: loss of her father   History   There has been worse in depressive symptoms in the context of stressors as above.  Although she may benefit from adjunctive treatment with antipsychotics, she prefers to stay on the current medication regimen at this time as she feels she is on too many medication for medical health issues as well.  Will continue venlafaxine to target depression and anxiety.  Will continue bupropion as adjunctive treatment for depression.  She will greatly benefit from CBT; will make a referral onsite.    # weight loss She will have colonoscopy.  Will continue to assess as needed.   # Cognitive deficits Improving. She has no known family history of dementia.  TSH, vitamin B12 was within normal limits in March 2023. Head CT is not done.  She does not have any other symptoms to be concerned of underlying neurological disorder at this time. Will plan to do further evaluation if any significant worsening in her symptoms and/or no improvement despite improvement in her mood in the future.    Plan  Continue venlafaxine 225 mg daily   Continue bupropion 450 mg daily Next appointment- 10/30 at 11 am for 30 mins, video Referral for therapy  [email or web address]   Past trials of medication: fluoxetine, citalopram, venlafaxine, bupropion, lithium, Abilify (headache),    The patient demonstrates the following risk factors for suicide: Chronic risk factors for suicide include: psychiatric disorder of depression. Acute risk factors for suicide include: unemployment and social withdrawal/isolation. Protective factors for this patient include: coping skills and hope for the future. Considering these factors, the overall suicide risk at this point appears to be low. Patient is appropriate for outpatient follow up.      Collaboration of Care: Collaboration of Care: Other reviewed notes in Epic  Patient/Guardian was advised Release of Information must be obtained prior to any record release in order to collaborate their care with an outside provider. Patient/Guardian was advised if they have not already done so to contact the registration department to sign all necessary forms in order for [place] to release information regarding their care.   Consent: Patient/Guardian gives verbal consent for treatment and assignment of benefits for services provided during this visit. Patient/Guardian expressed understanding and agreed to proceed.    Neysa Hotter, MD 03/25/2023, 12:12 PM

## 2023-03-18 NOTE — Progress Notes (Unsigned)

## 2023-03-20 ENCOUNTER — Other Ambulatory Visit (HOSPITAL_COMMUNITY): Payer: Self-pay

## 2023-03-25 ENCOUNTER — Encounter: Payer: Self-pay | Admitting: Psychiatry

## 2023-03-25 ENCOUNTER — Telehealth: Payer: PPO | Admitting: Psychiatry

## 2023-03-25 DIAGNOSIS — F330 Major depressive disorder, recurrent, mild: Secondary | ICD-10-CM

## 2023-03-25 DIAGNOSIS — F419 Anxiety disorder, unspecified: Secondary | ICD-10-CM | POA: Diagnosis not present

## 2023-03-26 ENCOUNTER — Ambulatory Visit: Payer: PPO | Admitting: Podiatry

## 2023-03-26 ENCOUNTER — Ambulatory Visit (INDEPENDENT_AMBULATORY_CARE_PROVIDER_SITE_OTHER): Payer: PPO

## 2023-03-26 ENCOUNTER — Encounter: Payer: Self-pay | Admitting: Podiatry

## 2023-03-26 DIAGNOSIS — S93325A Dislocation of tarsometatarsal joint of left foot, initial encounter: Secondary | ICD-10-CM

## 2023-03-26 DIAGNOSIS — S93325D Dislocation of tarsometatarsal joint of left foot, subsequent encounter: Secondary | ICD-10-CM

## 2023-03-28 NOTE — Progress Notes (Signed)
She presents today for follow-up of her fracture second metatarsal base of the left foot.  She states that is still tender occasionally but for the most part it feels fine.  Objective: Vital signs are stable alert oriented x 3 she has no pain on frontal plane range of motion only pain is on direct palpation of the proximal second metatarsal.  Radiographically it appears to be healing though not completely healed.  And no other abnormalities are noted radiographically.  Assessment: Healing Lisfranc's fracture second metatarsal base left.  Plan: Recommended that she get back into her regular shoe gear not to go barefooted no flip-flops sandals cute shoes dress shoes.  No squatting no stooping down no reaching overhead on her toes she understands this is amenable to it we will follow-up with me as needed.

## 2023-03-31 ENCOUNTER — Encounter: Payer: Self-pay | Admitting: Gastroenterology

## 2023-03-31 ENCOUNTER — Ambulatory Visit: Payer: PPO | Admitting: Gastroenterology

## 2023-03-31 VITALS — BP 124/55 | HR 75 | Temp 98.0°F | Resp 13 | Ht 64.0 in | Wt 117.0 lb

## 2023-03-31 DIAGNOSIS — Z1211 Encounter for screening for malignant neoplasm of colon: Secondary | ICD-10-CM

## 2023-03-31 DIAGNOSIS — R195 Other fecal abnormalities: Secondary | ICD-10-CM | POA: Diagnosis not present

## 2023-03-31 DIAGNOSIS — F332 Major depressive disorder, recurrent severe without psychotic features: Secondary | ICD-10-CM | POA: Diagnosis not present

## 2023-03-31 DIAGNOSIS — J45909 Unspecified asthma, uncomplicated: Secondary | ICD-10-CM | POA: Diagnosis not present

## 2023-03-31 DIAGNOSIS — D125 Benign neoplasm of sigmoid colon: Secondary | ICD-10-CM | POA: Diagnosis not present

## 2023-03-31 MED ORDER — SODIUM CHLORIDE 0.9 % IV SOLN: 500.0000 mL | Freq: Once | INTRAVENOUS | Status: DC

## 2023-03-31 NOTE — Progress Notes (Signed)
Pt's states no medical or surgical changes since previsit or office visit. 

## 2023-03-31 NOTE — Progress Notes (Signed)
Sedate, gd SR, tolerated procedure well, VSS, report to RN 

## 2023-03-31 NOTE — Op Note (Signed)
Samantha Barton Procedure Date: 03/31/2023 8:26 AM MRN: [id number] Endoscopist: Lorin Picket E. Tomasa Rand , MD, [id number] Age: 72 Referring MD:  Date of Birth: December 15, 1950 Gender: Female Account #: [id number] Procedure:                Colonoscopy Indications:              Positive Cologuard test Medicines:                Monitored Anesthesia Care Procedure:                Pre-Anesthesia Assessment:                           - Prior to the procedure, a History and Physical                            was performed, and patient medications and                            allergies were reviewed. The patient's tolerance of                            previous anesthesia was also reviewed. The risks                            and benefits of the procedure and the sedation                            options and risks were discussed with the patient.                            All questions were answered, and informed consent                            was obtained. Prior Anticoagulants: The patient has                            taken no anticoagulant or antiplatelet agents. ASA                            Grade Assessment: II - A patient with mild systemic                            disease. After reviewing the risks and benefits,                            the patient was deemed in satisfactory condition to                            undergo the procedure.                           After obtaining informed consent, the colonoscope  was passed under direct vision. Throughout the                            procedure, the patient's blood pressure, pulse, and                            oxygen saturations were monitored continuously. The                            Olympus Scope [id number] was introduced through the                            anus and advanced to the the terminal ileum, with                            identification of the  appendiceal orifice and IC                            valve. The colonoscopy was somewhat difficult due                            to a tortuous colon. Successful completion of the                            procedure was aided by using manual pressure. The                            patient tolerated the procedure well. The quality                            of the bowel preparation was poor in that there was                            copious brown liquid stool throughout the entire                            colon, with some solid stool and copious small                            solid food debris (seeds/nuts) which repeated                            occluded the suction channel. The terminal ileum,                            ileocecal valve, appendiceal orifice, and rectum                            were photographed. The bowel preparation used was                            GoLYTELY via split dose instruction. Scope In: 8:39:34 AM Scope Out: 9:09:49 AM Scope Withdrawal Time:  0 hours 16 minutes 37 seconds  Total Procedure Duration: 0 hours 30 minutes 15 seconds  Findings:                 The perianal and digital rectal examinations were                            normal. Pertinent negatives include normal                            sphincter tone and no palpable rectal lesions.                           A 3 mm polyp was found in the sigmoid colon. The                            polyp was sessile. The polyp was removed with a                            cold snare. Resection and retrieval were complete.                            Estimated blood loss was minimal.                           The exam was otherwise normal throughout the                            examined colon.                           The terminal ileum appeared normal.                           The retroflexed view of the distal rectum and anal                            verge was normal and showed no anal or rectal                             abnormalities. Complications:            No immediate complications. Estimated Blood Loss:     Estimated blood loss was minimal. Impression:               - Preparation of the colon was poor.                           - One 3 mm polyp in the sigmoid colon, removed with                            a cold snare. Resected and retrieved.                           - The examined portion of the ileum was normal.                           -  The distal rectum and anal verge are normal on                            retroflexion view. Recommendation:           - Patient has a contact number available for                            emergencies. The signs and symptoms of potential                            delayed complications were discussed with the                            patient. Return to normal activities tomorrow.                            Written discharge instructions were provided to the                            patient.                           - Resume previous diet.                           - Continue present medications.                           - Await pathology results.                           - Repeat colonoscopy in 1 year because the bowel                            preparation was poor.                           - Recommend 2 day bowel prep with subsequent                            colonoscopy. [name] E. Tomasa Rand, MD 03/31/2023 9:15:58 AM This report has been signed electronically.

## 2023-03-31 NOTE — Telephone Encounter (Signed)
FYI  KP

## 2023-03-31 NOTE — Progress Notes (Signed)
Called to room to assist during endoscopic procedure.  Patient ID and intended procedure confirmed with present staff. Received instructions for my participation in the procedure from the performing physician.  

## 2023-03-31 NOTE — Patient Instructions (Signed)
Educational handout provided to patient related to  Polyps  Resume previous diet  Continue present medications  Awaiting pathology results   YOU HAD AN ENDOSCOPIC PROCEDURE TODAY AT THE  ENDOSCOPY CENTER:   Refer to the procedure report that was given to you for any specific questions about what was found during the examination.  If the procedure report does not answer your questions, please call your gastroenterologist to clarify.  If you requested that your care partner not be given the details of your procedure findings, then the procedure report has been included in a sealed envelope for you to review at your convenience later.  YOU SHOULD EXPECT: Some feelings of bloating in the abdomen. Passage of more gas than usual.  Walking can help get rid of the air that was put into your GI tract during the procedure and reduce the bloating. If you had a lower endoscopy (such as a colonoscopy or flexible sigmoidoscopy) you may notice spotting of blood in your stool or on the toilet paper. If you underwent a bowel prep for your procedure, you may not have a normal bowel movement for a few days.  Please Note:  You might notice some irritation and congestion in your nose or some drainage.  This is from the oxygen used during your procedure.  There is no need for concern and it should clear up in a day or so.  SYMPTOMS TO REPORT IMMEDIATELY:  Following lower endoscopy (colonoscopy or flexible sigmoidoscopy):  Excessive amounts of blood in the stool  Significant tenderness or worsening of abdominal pains  Swelling of the abdomen that is new, acute  Fever of 100F or higher   For urgent or emergent issues, a gastroenterologist can be reached at any hour by calling [phone]. Do not use MyChart messaging for urgent concerns.    DIET:  We do recommend a small meal at first, but then you may proceed to your regular diet.  Drink plenty of fluids but you should avoid alcoholic beverages for  24 hours.  ACTIVITY:  You should plan to take it easy for the rest of today and you should NOT DRIVE or use heavy machinery until tomorrow (because of the sedation medicines used during the test).    FOLLOW UP: Our staff will call the number listed on your records the next business day following your procedure.  We will call around 7:15- 8:00 am to check on you and address any questions or concerns that you may have regarding the information given to you following your procedure. If we do not reach you, we will leave a message.     If any biopsies were taken you will be contacted by phone or by letter within the next 1-3 weeks.  Please call us at [phone] if you have not heard about the biopsies in 3 weeks.    SIGNATURES/CONFIDENTIALITY: You and/or your care partner have signed paperwork which will be entered into your electronic medical record.  These signatures attest to the fact that that the information above on your After Visit Summary has been reviewed and is understood.  Full responsibility of the confidentiality of this discharge information lies with you and/or your care-partner.
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
## 2023-04-01 ENCOUNTER — Telehealth: Payer: Self-pay

## 2023-04-01 NOTE — Telephone Encounter (Signed)
Attempted to reach patient for post-procedure f/u call. No answer. Left message for her to please not hesitate to call if she has any questions/concerns regarding her care. 

## 2023-04-02 ENCOUNTER — Other Ambulatory Visit (HOSPITAL_COMMUNITY): Payer: Self-pay

## 2023-04-03 LAB — SURGICAL PATHOLOGY

## 2023-04-04 NOTE — Progress Notes (Signed)
[name],  The polyp which I removed during your recent procedure was proven to be completely benign but is considered a "pre-cancerous" polyp that MAY have grown into cancer if it had not been removed.  Studies shows that at least 20% of women over age 72 and 30% of men over age 11 have pre-cancerous polyps.   Because of the inadequate bowel prep, I recommend you repeat colonoscopy in 1 year.   If you develop any new rectal bleeding, abdominal pain or significant bowel habit changes, please contact me before then.

## 2023-04-14 DIAGNOSIS — H2513 Age-related nuclear cataract, bilateral: Secondary | ICD-10-CM | POA: Diagnosis not present

## 2023-04-14 DIAGNOSIS — H401122 Primary open-angle glaucoma, left eye, moderate stage: Secondary | ICD-10-CM | POA: Diagnosis not present

## 2023-05-10 NOTE — Progress Notes (Unsigned)
Virtual Visit via Video Note  I connected with Samantha Barton on 05/13/23 at 11:00 AM EST by a video enabled telemedicine application and verified that I am speaking with the correct person using two identifiers.  Location: Patient: home Provider: office Persons participated in the visit- patient, provider    I discussed the limitations of evaluation and management by telemedicine and the availability of in person appointments. The patient expressed understanding and agreed to proceed.    I discussed the assessment and treatment plan with the patient. The patient was provided an opportunity to ask questions and all were answered. The patient agreed with the plan and demonstrated an understanding of the instructions.   The patient was advised to call back or seek an in-person evaluation if the symptoms worsen or if the condition fails to improve as anticipated.  I provided 25 minutes of non-face-to-face time during this encounter.   Neysa Hotter, MD    [facility] MD/PA/NP OP Progress Note  05/13/2023 11:33 AM Samantha Barton  MRN:  [id number]  Chief Complaint:  Chief Complaint  Patient presents with   Follow-up   HPI:  This is a follow-up appointment for depression and anxiety.  She states that she has been doing the same.  She has good days and bad days.  She usually feels good when she is able to keep herself busy.  There are days she wants to stay in the bed, although she will eventually get up in the afternoon.  It happens every week, with sometimes happens 2 days in a row.  She goes to horse riding every day.  She had a good time with her old friend on Thanksgiving.  She feels socially deprived.  She does not have many friends since she moved to [place] 7 years ago.  Although she recognizes that there could be opportunities, she perceives it as a Personal assistant. Explored her underlying values around social connectivity--whether she is experiencing solitude, loneliness, or if anhedonia  might be contributing to these.  She states that she does not think she is as depressed before, and she feels comfortable the way she is.  She has middle insomnia at times.  She has good appetite. She denies anxiety or panic attacks. She denies SI.  She was to stay on the current medication as they are.    Wt Readings from Last 3 Encounters:  03/31/23 117 lb (53.1 kg)  03/18/23 117 lb (53.1 kg)  09/29/22 117 lb 6.4 oz (53.3 kg)     Visit Diagnosis:    ICD-10-CM   1. MDD (major depressive disorder), recurrent episode, mild (HCC)  F33.0     2. Anxiety disorder, unspecified type  F41.9       Past Psychiatric History: Please see initial evaluation for full details. I have reviewed the history. No updates at this time.     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   Fracture, orbital (HCC) 06/2015   Right Eye -Horse ACCIDENT   Hand fracture, right 06/2015   Horse Accident    Hypertriglyceridemia     Past Surgical History:  Procedure Laterality Date   BREAST BIOPSY Right 1998   bx/clip-neg   FRACTURE SURGERY  2017   JOINT REPLACEMENT  08/10/2019   ORBITAL FRACTURE SURGERY  04/25/2015   ORIF ORBITAL FRACTURE Right 04/25/2015   Procedure: OPEN REDUCTION  INTERNAL FIXATION (ORIF) ORBITAL FRACTURE;  Surgeon: Geanie Logan, MD;  Location: [facility] ORS;  Service: ENT;  Laterality: Right;   SIGMOIDOSCOPY     TOTAL HIP ARTHROPLASTY Left 08/10/2019    Family Psychiatric History: Please see initial evaluation for full details. I have reviewed the history. No updates at this time.     Family History:  Family History  Problem Relation Age of Onset   Healthy Mother    Diabetes Father    Breast cancer Neg Hx    Colon cancer Neg Hx    Rectal cancer Neg Hx    Stomach cancer Neg Hx    Esophageal cancer Neg Hx     Social History:  Social History   Socioeconomic History    Marital status: Single    Spouse name: Not on file   Number of children: 0   Years of education: Not on file   Highest education level: Master's degree (e.g., MA, MS, MEng, MEd, MSW, MBA)  Occupational History   Occupation: Retired  Tobacco Use   Smoking status: Never   Smokeless tobacco: Never   Tobacco comments:    smoking cessation materials not required  Vaping Use   Vaping status: Never Used  Substance and Sexual Activity   Alcohol use: No    Alcohol/week: 0.0 standard drinks of alcohol   Drug use: No   Sexual activity: Not Currently    Birth control/protection: Post-menopausal  Other Topics Concern   Not on file  Social History Narrative   Not on file   Social Drivers of Health   Financial Resource Strain: Low Risk  (09/28/2022)   Overall Financial Resource Strain (CARDIA)    Difficulty of Paying Living Expenses: Not hard at all  Food Insecurity: No Food Insecurity (09/28/2022)   Hunger Vital Sign    Worried About Running Out of Food in the Last Year: Never true    Ran Out of Food in the Last Year: Never true  Transportation Needs: No Transportation Needs (09/28/2022)   PRAPARE - Administrator, Civil Service (Medical): No    Lack of Transportation (Non-Medical): No  Physical Activity: Unknown (09/28/2022)   Exercise Vital Sign    Days of Exercise per Week: Patient declined    Minutes of Exercise per Session: 40 min  Stress: No Stress Concern Present (09/28/2022)   [employer] of Occupational Health - Occupational Stress Questionnaire    Feeling of Stress : Only a little  Social Connections: Unknown (09/28/2022)   Social Connection and Isolation Panel [NHANES]    Frequency of Communication with Friends and Family: Patient declined    Frequency of Social Gatherings with Friends and Family: Patient declined    Attends Religious Services: Never    Database administrator or Organizations: No    Attends Engineer, structural: Never    Marital Status:  Divorced    Allergies: No Known Allergies  Metabolic Disorder Labs: Lab Results  Component Value Date   HGBA1C 5.7 (A) 09/29/2022   No results found for: "PROLACTIN" Lab Results  Component Value Date   CHOL 268 (H) 01/28/2022   TRIG 114 01/28/2022   HDL 90 01/28/2022   CHOLHDL 3.0 01/28/2022   LDLCALC 159 (H) 01/28/2022   LDLCALC 142 (H) 01/24/2021   Lab Results  Component Value Date   TSH 1.630 01/28/2022   TSH 1.370 08/23/2021    Therapeutic Level Labs: No results found for: "LITHIUM" No results found for: "VALPROATE" No results  found for: "CBMZ"  Current Medications: Current Outpatient Medications  Medication Sig Dispense Refill   albuterol (VENTOLIN HFA) 108 (90 Base) MCG/ACT inhaler Inhale 2 puffs into the lungs every 6 (six) hours as needed for wheezing or shortness of breath. 6.7 g 3   buPROPion (WELLBUTRIN XL) 150 MG 24 hr tablet Take 1 tablet (150 mg total) by mouth daily.Take along with 300 mg tab for a total of 450 mg daily. 90 tablet 1   buPROPion (WELLBUTRIN XL) 300 MG 24 hr tablet Take 1 tablet (300 mg total) by mouth daily. (Take along with 150 mg tab for a total daily dose of 450mg ). 90 tablet 1   Cholecalciferol (VITAMIN D) 50 MCG (2000 UT) CAPS      fluticasone-salmeterol (ADVAIR) 250-50 MCG/ACT AEPB Inhale 1 puff into the lungs in the morning and at bedtime. 60 each 5   latanoprost (XALATAN) 0.005 % ophthalmic solution Place 1 drop into both eyes every evening. 5 mL 12   ondansetron (ZOFRAN) 4 MG tablet Take 1 tablet (4 mg total) by mouth every 12 (twelve) hours as needed for nausea or vomiting. May take 1 tablet before colonoscopy prep the day before and day of the procedure. (Patient not taking: Reported on 03/31/2023) 4 tablet 0   venlafaxine XR (EFFEXOR-XR) 150 MG 24 hr capsule Take 1 capsule (150 mg total) by mouth daily. 90 capsule 3   venlafaxine XR (EFFEXOR-XR) 75 MG 24 hr capsule Take 1 capsule (75 mg total) by mouth daily. 90 capsule 3   No  current facility-administered medications for this visit.     Musculoskeletal: Strength & Muscle Tone:  N/A Gait & Station:  N/A Patient leans: N/A  Psychiatric Specialty Exam: Review of Systems  Psychiatric/Behavioral:  Positive for dysphoric mood. Negative for agitation, behavioral problems, confusion, decreased concentration, hallucinations, self-injury, sleep disturbance and suicidal ideas. The patient is not nervous/anxious and is not hyperactive.   All other systems reviewed and are negative.   There were no vitals taken for this visit.There is no height or weight on file to calculate BMI.  General Appearance: Well Groomed  Eye Contact:  Good  Speech:  Clear and Coherent  Volume:  Normal  Mood:   same  Affect:  Appropriate, Congruent, and slightly down  Thought Process:  Coherent  Orientation:  Full (Time, Place, and Person)  Thought Content: Logical   Suicidal Thoughts:  No  Homicidal Thoughts:  No  Memory:  Immediate;   Good  Judgement:  Good  Insight:  Good  Psychomotor Activity:  Normal  Concentration:  Concentration: Good and Attention Span: Good  Recall:  Good  Fund of Knowledge: Good  Language: Good  Akathisia:  No  Handed:  Right  AIMS (if indicated): not done  Assets:  Communication Skills Desire for Improvement  ADL's:  Intact  Cognition: WNL  Sleep:  Fair   Screenings: GAD-7    Flowsheet Row Office Visit from 09/29/2022 in [id number] Primary Care & Sports Medicine at [facility] Office Visit from 01/28/2022 in [facility] Care & Sports Medicine at [facility] Office Visit from 08/23/2021 in [facility] Primary Care & Sports Medicine at [facility] Office Visit from 01/24/2021 in [facility] Primary Care & Sports Medicine at [facility] Office Visit from 12/21/2019 in [facility] Primary Care & Sports Medicine at [facility]  Total GAD-7 Score 0 0 0 0 0      PHQ2-9    Flowsheet Row Office Visit from 09/29/2022  in [facility]  Primary Care & Sports Medicine at [facility] Clinical Support from 06/16/2022 in [facility] Primary Care & Sports Medicine at [facility] Office Visit from 01/28/2022 in [facility] Primary Care & Sports Medicine at [facility] Office Visit from 08/23/2021 in [facility] Primary Care & Sports Medicine at [facility] Video Visit from 06/03/2021 in [facility]  PHQ-2 Total Score 0 0 0 0 1  PHQ-9 Total Score 0 -- 0 0 1      Flowsheet Row ED from 02/05/2023 in [facility] RISK CATEGORY No Risk        Assessment and Plan:  Samantha Barton is a 72 y.o. year old female with a history of depression, arthritis, who presents for follow up appointment for below.    1. MDD (major depressive disorder), recurrent episode, mild (HCC) 2. Anxiety disorder, unspecified type Acute stressors include: loss of her mother May 2024, limited contact with her sister in [place], foot fracture Other stressors include: loss of her father   History    She continues to report anhedonia and low energy since the last visit.  Although she may benefit from adjunctive treatment for depression, she prefers to wait for therapy as she does not necessarily think she is depressed as much compared to before.  Will continue venlafaxine to target depression and anxiety.  Will continue bupropion as adjunctive treatment for depression.   # weight loss She denies any weight loss since the last visit, although she has not measured it.  Colonoscopy was reportedly normal.  She has an upcoming appointment with her PCP in Feb, and would like to hold any blood test till then.    Plan  Continue venlafaxine 225 mg daily   Continue bupropion 450 mg daily Next appointment- 2/14 at 10 30 am for 30 mins, video Referral for therapy onsite [email or web address]     Past trials of medication: fluoxetine, citalopram, venlafaxine, bupropion, lithium, Abilify  (headache),    The patient demonstrates the following risk factors for suicide: Chronic risk factors for suicide include: psychiatric disorder of depression. Acute risk factors for suicide include: unemployment and social withdrawal/isolation. Protective factors for this patient include: coping skills and hope for the future. Considering these factors, the overall suicide risk at this point appears to be low. Patient is appropriate for outpatient follow up.      Collaboration of Care: Collaboration of Care: Other reviewed notes in Epic  Patient/Guardian was advised Release of Information must be obtained prior to any record release in order to collaborate their care with an outside provider. Patient/Guardian was advised if they have not already done so to contact the registration department to sign all necessary forms in order for [place] to release information regarding their care.   Consent: Patient/Guardian gives verbal consent for treatment and assignment of benefits for services provided during this visit. Patient/Guardian expressed understanding and agreed to proceed.    Neysa Hotter, MD 05/13/2023, 11:33 AM

## 2023-05-13 ENCOUNTER — Encounter: Payer: Self-pay | Admitting: Psychiatry

## 2023-05-13 ENCOUNTER — Telehealth (INDEPENDENT_AMBULATORY_CARE_PROVIDER_SITE_OTHER): Payer: PPO | Admitting: Psychiatry

## 2023-05-13 DIAGNOSIS — F419 Anxiety disorder, unspecified: Secondary | ICD-10-CM

## 2023-05-13 DIAGNOSIS — F330 Major depressive disorder, recurrent, mild: Secondary | ICD-10-CM

## 2023-05-13 NOTE — Patient Instructions (Signed)
Continue venlafaxine 225 mg daily   Continue bupropion 450 mg daily Next appointment- 2/14 at 10 30

## 2023-05-24 ENCOUNTER — Other Ambulatory Visit: Payer: Self-pay | Admitting: Internal Medicine

## 2023-05-24 DIAGNOSIS — J4540 Moderate persistent asthma, uncomplicated: Secondary | ICD-10-CM

## 2023-05-28 ENCOUNTER — Other Ambulatory Visit (HOSPITAL_COMMUNITY): Payer: Self-pay

## 2023-05-28 MED ORDER — ALBUTEROL SULFATE HFA 108 (90 BASE) MCG/ACT IN AERS
2.0000 | INHALATION_SPRAY | Freq: Four times a day (QID) | RESPIRATORY_TRACT | 2 refills | Status: DC | PRN
  Filled 2023-05-28: qty 6.7, 25d supply, fill #0
  Filled 2023-07-02: qty 6.7, 25d supply, fill #1
  Filled 2023-08-26: qty 6.7, 25d supply, fill #2

## 2023-05-28 NOTE — Telephone Encounter (Signed)
 Requested Prescriptions  Pending Prescriptions Disp Refills   albuterol  (VENTOLIN  HFA) 108 (90 Base) MCG/ACT inhaler 6.7 g 2    Sig: Inhale 2 puffs into the lungs every 6 (six) hours as needed for wheezing or shortness of breath.     Pulmonology:  Beta Agonists 2 Passed - 05/28/2023  2:33 PM      Passed - Last BP in normal range    BP Readings from Last 1 Encounters:  03/31/23 (!) 124/55         Passed - Last Heart Rate in normal range    Pulse Readings from Last 1 Encounters:  03/31/23 75         Passed - Valid encounter within last 12 months    Recent Outpatient Visits           8 months ago Shortness of breath   [place] [facility] at [facility], Leita DEL, MD   1 year ago Annual physical exam   [facility] Care & [facility] Medicine at [facility], Leita DEL, MD   1 year ago Lumbar back pain   [place] [facility] Medicine at [facility], Leita DEL, MD   2 years ago Annual physical exam   [facility] at [facility], Leita DEL, MD   3 years ago Annual physical exam   [facility] Care & [facility] Medicine at [facility], Leita DEL, MD       Future Appointments             In 1 month [place], Leita DEL, MD [facility] Primary Care & Sports Medicine at [facility]

## 2023-05-29 ENCOUNTER — Ambulatory Visit (INDEPENDENT_AMBULATORY_CARE_PROVIDER_SITE_OTHER): Payer: PPO | Admitting: Professional Counselor

## 2023-05-29 DIAGNOSIS — F4321 Adjustment disorder with depressed mood: Secondary | ICD-10-CM | POA: Diagnosis not present
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
## 2023-06-11 ENCOUNTER — Ambulatory Visit: Payer: PPO | Admitting: Professional Counselor

## 2023-06-11 DIAGNOSIS — F4321 Adjustment disorder with depressed mood: Secondary | ICD-10-CM | POA: Diagnosis not present

## 2023-06-11 NOTE — Progress Notes (Signed)
   THERAPIST PROGRESS NOTE  Session Time: 11:00 AM - 11:55 AM  Participation Level: Active  Behavioral Response: Well Groomed, Alert, Angry  Type of Therapy: Individual Therapy  Treatment Goals addressed: Active OP Depression  LTG: "I want to be able to feel more comfortable out in the world, with other people, not be so shy, not be so isolative. I think I want to be more engaged in the world, knowing that I don't have to do it all the time."    Start:  06/11/23    Expected End:  06/09/24     STG: "I want to be able to diminish these thoughts without excusing them. Minimize them where they don't bother me as much" To reduce the impact of trauma AEB processing past, identifying stuck points, and restructuring those beliefs over next 12 weeks.    ProgressTowards Goals: Initial  Interventions: CBT and Motivational Interviewing  Summary: Samantha Barton is a 73 y.o. female who presents with recurrent major depressive disorder and adjustment disorder. She appeared alert and oriented x5. She discussed ruminations on her mother and her childhood. [name] shares some of these thoughts during session. She expressed the anger and frustration she feels about how she was treated. She noted ways she tried to stand up for herself but noted it was after her mother was sick and elderly. She engaged in completing her treatment plan. [name] expressed confusion about homework assignment but took notes on how to complete it.   Therapist Response: Conducted session with [name]. Began session with check-in/update since initial session. Utilized empathetic and reflective listening. Developed treatment plan with input from [name] on current strengths, needs, and progress towards goals. Explained homework assignment of writing an impact statement about her childhood/relationship with mother. Confirmed next appointment and concluded session.   Suicidal/Homicidal: No  Plan: Return again in 1 weeks.  Diagnosis: Adjustment  disorder with depressed mood  Collaboration of Care: Medication Management AEB chart review  Patient/Guardian was advised Release of Information must be obtained prior to any record release in order to collaborate their care with an outside provider. Patient/Guardian was advised if they have not already done so to contact the registration department to sign all necessary forms in order for [place] to release information regarding their care.   Consent: Patient/Guardian gives verbal consent for treatment and assignment of benefits for services provided during this visit. Patient/Guardian expressed understanding and agreed to proceed.   [name], [facility] 06/11/2023

## 2023-06-18 ENCOUNTER — Ambulatory Visit: Payer: PPO | Admitting: Professional Counselor

## 2023-06-18 DIAGNOSIS — F4321 Adjustment disorder with depressed mood: Secondary | ICD-10-CM | POA: Diagnosis not present

## 2023-06-18 NOTE — Progress Notes (Signed)
   THERAPIST PROGRESS NOTE  Session Time: 11:00 AM - 12:00 PM  Participation Level: Active  Behavioral Response: Neat, Alert, Euthymic  Type of Therapy: Individual Therapy  Treatment Goals addressed: Active OP Depression LTG: "I want to be able to feel more comfortable out in the world, with other people, not be so shy, not be so isolative. I think I want to be more engaged in the world, knowing that I don't have to do it all the time."                Start:  06/11/23    Expected End:  06/09/24      STG: "I want to be able to diminish these thoughts without excusing them. Minimize them where they don't bother me as much" To reduce the impact of trauma AEB processing past, identifying stuck points, and restructuring those beliefs over next 12 weeks.    ProgressTowards Goals: Progressing  Interventions: CBT and Motivational Interviewing  Summary: Samantha Barton is a 72 y.o. female who presents with MDD and adjustment disorder. She appeared alert and oriented x5. She was neatly dressed and appropriately groomed. She reported she completed the impact statement assignment and focused on her mother not getting her the care she needed when she was referred for services as a child. Samantha Barton noted the impact it had on her trust with people. She engaged in discussion and was receptive to Socratic questioning. [name] agreed she overgeneralized her thoughts about things. She brainstormed ways she might begin to practice exposure but expressed fear about doing so. She was in agreement to keep a thought log to analyze in future sessions for distortions/stuck points.  Therapist Response: Conducted session with Samantha Barton. Began session with check-in/update since previous session. Used empathetic and reflective listening. Actively listened as Samantha Barton read impact statement. Used open-ended questions and Socratic questioning to challenge negative thinking. Highlighted overgeneralization and mind reading. Assisted with  brainstorming ways to gain exposure. Assigned homework to keep thought log to analyze in future sessions. Scheduled next appointment and concluded session.   Suicidal/Homicidal: No  Plan: Return again in 1 week.  Diagnosis: Adjustment disorder with depressed mood  Collaboration of Care: Medication Management AEB chart review  Patient/Guardian was advised Release of Information must be obtained prior to any record release in order to collaborate their care with an outside provider. Patient/Guardian was advised if they have not already done so to contact the registration department to sign all necessary forms in order for [place] to release information regarding their care.   Consent: Patient/Guardian gives verbal consent for treatment and assignment of benefits for services provided during this visit. Patient/Guardian expressed understanding and agreed to proceed.   [name], [facility] 06/18/2023

## 2023-06-24 ENCOUNTER — Ambulatory Visit (INDEPENDENT_AMBULATORY_CARE_PROVIDER_SITE_OTHER): Payer: PPO

## 2023-06-24 DIAGNOSIS — Z1231 Encounter for screening mammogram for malignant neoplasm of breast: Secondary | ICD-10-CM

## 2023-06-24 DIAGNOSIS — Z0000 Encounter for general adult medical examination without abnormal findings: Secondary | ICD-10-CM
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
## 2023-06-24 NOTE — Patient Instructions (Addendum)
Samantha Barton , Thank you for taking time to come for your Medicare Wellness Visit. I appreciate your ongoing commitment to your health goals. Please review the following plan we discussed and let me know if I can assist you in the future.   Referrals/Orders/Follow-Ups/Clinician Recommendations: REFERRAL FOR MAMMOGRAM  You have an order for:  []   2D Mammogram  []   3D Mammogram  []   Bone Density     Please call for appointment:  [facility] [address] [place], [place] [address] [phone] [facility] [address] [place], [place] [address] [phone] [facility] [place] [address] [place], [place] [address] [phone] [facility] [place] [facility] D[facility] [address] [place], [place] [address] [phone] [facility] [address] [place], [place] [address] [phone]   Make sure to wear two-piece clothing.  No lotions, powders, or deodorants the day of the appointment. Make sure to bring picture ID and insurance card.  Bring list of medications you are currently taking including any supplements.   Schedule your [place] screening mammogram through MyChart!   Log into your MyChart account.  Go to 'Visit' (or 'Appointments' if on mobile App) --> Schedule an Appointment  Under 'Select a Reason for Visit' choose the Mammogram Screening option.  Complete the pre-visit questions and select the time and place that best fits your schedule.   This is a list of the screening recommended for you and due dates:  Health Maintenance  Topic Date Due   COVID-19 Vaccine (4 - 2024-25 season) 01/25/2023   DTaP/Tdap/Td vaccine (2 - Td or Tdap) 05/28/2023   Mammogram  07/25/2023   Medicare Annual Wellness Visit  06/23/2024   Cologuard (Stool DNA test)  02/09/2026   Pneumonia Vaccine  Completed   Flu Shot  Completed   DEXA scan (bone density measurement)  Completed   Hepatitis C  Screening  Completed   Zoster (Shingles) Vaccine  Completed   HPV Vaccine  Aged Out    Advanced directives: (In Chart) A copy of your advanced directives are scanned into your chart should your provider ever need it.  Next Medicare Annual Wellness Visit scheduled for next year: Yes   06/29/24 @ 8:50 AM BY VIDEO

## 2023-06-25 ENCOUNTER — Ambulatory Visit (INDEPENDENT_AMBULATORY_CARE_PROVIDER_SITE_OTHER): Payer: PPO | Admitting: Professional Counselor

## 2023-06-25 DIAGNOSIS — F419 Anxiety disorder, unspecified: Secondary | ICD-10-CM | POA: Diagnosis not present

## 2023-06-25 DIAGNOSIS — F330 Major depressive disorder, recurrent, mild: Secondary | ICD-10-CM
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
## 2023-06-28 ENCOUNTER — Other Ambulatory Visit: Payer: Self-pay | Admitting: Psychiatry

## 2023-06-29 ENCOUNTER — Other Ambulatory Visit: Payer: Self-pay

## 2023-06-29 ENCOUNTER — Other Ambulatory Visit (HOSPITAL_COMMUNITY): Payer: Self-pay

## 2023-06-29 MED ORDER — BUPROPION HCL ER (XL) 150 MG PO TB24
150.0000 mg | ORAL_TABLET | Freq: Every day | ORAL | 1 refills | Status: DC
  Filled 2023-06-29: qty 90, 90d supply, fill #0
  Filled 2023-09-28: qty 90, 90d supply, fill #1

## 2023-07-03 ENCOUNTER — Ambulatory Visit (INDEPENDENT_AMBULATORY_CARE_PROVIDER_SITE_OTHER): Payer: Self-pay | Admitting: Professional Counselor

## 2023-07-03 DIAGNOSIS — F419 Anxiety disorder, unspecified: Secondary | ICD-10-CM | POA: Diagnosis not present

## 2023-07-03 DIAGNOSIS — F330 Major depressive disorder, recurrent, mild: Secondary | ICD-10-CM
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
## 2023-07-05 NOTE — Progress Notes (Signed)
Virtual Visit via Video Note  I connected with Samantha Barton on 07/10/23 at 10:30 AM EST by a video enabled telemedicine application and verified that I am speaking with the correct person using two identifiers.  Location: Patient: home Provider: office Persons participated in the visit- patient, provider    I discussed the limitations of evaluation and management by telemedicine and the availability of in person appointments. The patient expressed understanding and agreed to proceed.    I discussed the assessment and treatment plan with the patient. The patient was provided an opportunity to ask questions and all were answered. The patient agreed with the plan and demonstrated an understanding of the instructions.   The patient was advised to call back or seek an in-person evaluation if the symptoms worsen or if the condition fails to improve as anticipated.    Samantha Hotter, MD    [facility] MD/PA/NP OP Progress Note  07/10/2023 11:05 AM Samantha Barton  MRN:  [id number]  Chief Complaint:  Chief Complaint  Patient presents with   Follow-up   HPI:  This is a follow-up appointment for depression and anxiety.  She states that she has been feeling sad and depressed at times.  She also started to feel anxious, which is new to her.  Whenever she tries to go to see her horse, she feels more anxious.  She is ready to go back once she gets there.  She reports concern about financial strain to keep this.  She does not want to ride a horse due to her recent experience of falling and fracturing a bone. However, she recognizes that it is beneficial for her as it allows her to be around people. Without this activity, she likely would not go outside. Exploring the value this activity holds for her and identifying other possible ways for her to stay engaged and connected.  She states that she tends to have thoughts of black and white thinking.  She finds therapy to be very helpful.  She has been  working on the homework.  She sleeps up to 8 hours with middle insomnia.  She has good appetite.  She is vegetarian, and eats food such as tofu, beans.  She does not have weight loss anymore.  She denies SI except that she imagines occasionally what if if she were to jump off from the building. She thinks it is awful and she adamantly denies any intent or plan.    Wt Readings from Last 3 Encounters:  07/07/23 117 lb (53.1 kg)  03/31/23 117 lb (53.1 kg)  03/18/23 117 lb (53.1 kg)       Visit Diagnosis:    ICD-10-CM   1. MDD (major depressive disorder), recurrent episode, mild (HCC)  F33.0     2. Anxiety disorder, unspecified type  F41.9       Past Psychiatric History: Please see initial evaluation for full details. I have reviewed the history. No updates at this time.     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   Fracture, orbital (HCC) 06/2015   Right Eye -Horse ACCIDENT   Hand fracture, right 06/2015   Horse Accident    Hypertriglyceridemia     Past Surgical History:  Procedure Laterality Date   BREAST BIOPSY Right 1998   bx/clip-neg   FRACTURE SURGERY  2017   JOINT REPLACEMENT  08/10/2019  ORBITAL FRACTURE SURGERY  04/25/2015   ORIF ORBITAL FRACTURE Right 04/25/2015   Procedure: OPEN REDUCTION INTERNAL FIXATION (ORIF) ORBITAL FRACTURE;  Surgeon: Geanie Logan, MD;  Location: [facility] ORS;  Service: ENT;  Laterality: Right;   SIGMOIDOSCOPY     TOTAL HIP ARTHROPLASTY Left 08/10/2019    Family Psychiatric History: Please see initial evaluation for full details. I have reviewed the history. No updates at this time.     Family History:  Family History  Problem Relation Age of Onset   Healthy Mother    Diabetes Father    Breast cancer Neg Hx    Colon cancer Neg Hx    Rectal cancer Neg Hx    Stomach cancer Neg Hx    Esophageal cancer Neg Hx     Social  History:  Social History   Socioeconomic History   Marital status: Single    Spouse name: Not on file   Number of children: 0   Years of education: Not on file   Highest education level: Master's degree (e.g., MA, MS, MEng, MEd, MSW, MBA)  Occupational History   Occupation: Retired  Tobacco Use   Smoking status: Never   Smokeless tobacco: Never   Tobacco comments:    smoking cessation materials not required  Vaping Use   Vaping status: Never Used  Substance and Sexual Activity   Alcohol use: No    Alcohol/week: 0.0 standard drinks of alcohol   Drug use: No   Sexual activity: Not Currently    Birth control/protection: Post-menopausal  Other Topics Concern   Not on file  Social History Narrative   Not on file   Social Drivers of Health   Financial Resource Strain: Low Risk  (06/24/2023)   Overall Financial Resource Strain (CARDIA)    Difficulty of Paying Living Expenses: Not hard at all  Food Insecurity: No Food Insecurity (06/24/2023)   Hunger Vital Sign    Worried About Running Out of Food in the Last Year: Never true    Ran Out of Food in the Last Year: Never true  Transportation Needs: No Transportation Needs (06/24/2023)   PRAPARE - Administrator, Civil Service (Medical): No    Lack of Transportation (Non-Medical): No  Physical Activity: Sufficiently Active (06/24/2023)   Exercise Vital Sign    Days of Exercise per Week: 3 days    Minutes of Exercise per Session: 60 min  Stress: No Stress Concern Present (06/24/2023)   [employer] of Occupational Health - Occupational Stress Questionnaire    Feeling of Stress : Not at all  Social Connections: Socially Isolated (06/24/2023)   Social Connection and Isolation Panel [NHANES]    Frequency of Communication with Friends and Family: Once a week    Frequency of Social Gatherings with Friends and Family: Never    Attends Religious Services: Never    Database administrator or Organizations: No    Attends  Banker Meetings: Never    Marital Status: Divorced    Allergies: No Known Allergies  Metabolic Disorder Labs: Lab Results  Component Value Date   HGBA1C 6.1 (H) 07/07/2023   No results found for: "PROLACTIN" Lab Results  Component Value Date   CHOL 271 (H) 07/07/2023   TRIG 166 (H) 07/07/2023   HDL 94 07/07/2023   CHOLHDL 2.9 07/07/2023   LDLCALC 148 (H) 07/07/2023   LDLCALC 159 (H) 01/28/2022   Lab Results  Component Value Date   TSH 1.500 07/07/2023   TSH  1.630 01/28/2022    Therapeutic Level Labs: No results found for: "LITHIUM" No results found for: "VALPROATE" No results found for: "CBMZ"  Current Medications: Current Outpatient Medications  Medication Sig Dispense Refill   albuterol (VENTOLIN HFA) 108 (90 Base) MCG/ACT inhaler Inhale 2 puffs into the lungs every 6 (six) hours as needed for wheezing or shortness of breath. 6.7 g 2   buPROPion (WELLBUTRIN XL) 150 MG 24 hr tablet Take 1 tablet (150 mg total) by mouth daily. 90 tablet 1   [START ON 08/26/2023] buPROPion (WELLBUTRIN XL) 300 MG 24 hr tablet Take 1 tablet (300 mg total) by mouth daily. (Take along with 150 mg tab for a total daily dose of 450mg ). 90 tablet 1   Cholecalciferol (VITAMIN D) 50 MCG (2000 UT) CAPS      Fluticasone-Umeclidin-Vilant (TRELEGY ELLIPTA) 200-62.5-25 MCG/ACT AEPB Inhale 1 Inhalation into the lungs daily at 6 (six) AM. 60 each 3   latanoprost (XALATAN) 0.005 % ophthalmic solution Place 1 drop into both eyes every evening. 5 mL 12   venlafaxine XR (EFFEXOR-XR) 150 MG 24 hr capsule Take 1 capsule (150 mg total) by mouth daily. 90 capsule 3   venlafaxine XR (EFFEXOR-XR) 75 MG 24 hr capsule Take 1 capsule (75 mg total) by mouth daily. 90 capsule 3   No current facility-administered medications for this visit.     Musculoskeletal: Strength & Muscle Tone:  N/A Gait & Station:  N/A Patient leans: N/A  Psychiatric Specialty Exam: Review of Systems  Psychiatric/Behavioral:   Positive for dysphoric mood and sleep disturbance. Negative for agitation, behavioral problems, confusion, decreased concentration, hallucinations, self-injury and suicidal ideas. The patient is nervous/anxious. The patient is not hyperactive.   All other systems reviewed and are negative.   There were no vitals taken for this visit.There is no height or weight on file to calculate BMI.  General Appearance: Well Groomed  Eye Contact:  Good  Speech:  Clear and Coherent  Volume:  Normal  Mood:   fine  Affect:  Appropriate, Congruent, and Full Range  Thought Process:  Coherent  Orientation:  Full (Time, Place, and Person)  Thought Content: Logical   Suicidal Thoughts:  No  Homicidal Thoughts:  No  Memory:  Immediate;   Good  Judgement:  Good  Insight:  Good  Psychomotor Activity:  Normal  Concentration:  Concentration: Good and Attention Span: Good  Recall:  Good  Fund of Knowledge: Good  Language: Good  Akathisia:  No  Handed:  Right  AIMS (if indicated): not done  Assets:  Communication Skills Desire for Improvement  ADL's:  Intact  Cognition: WNL  Sleep:  Fair   Screenings: GAD-7    Flowsheet Row Office Visit from 07/07/2023 in [facility] at [facility] Counselor from 05/29/2023 in [facility] Office Visit from 09/29/2022 in [facility] Primary Care & Sports Medicine at [facility] Office Visit from 01/28/2022 in [facility] Primary Care & Sports Medicine at [facility] Office Visit from 08/23/2021 in [facility] & [facility] Medicine at [facility]  Total GAD-7 Score 2 0 0 0 0      PHQ2-9    Flowsheet Row Office Visit from 07/07/2023 in [facility] Primary Care & [facility] Medicine at [facility] Clinical Support from 06/24/2023 in [facility] Primary Care & Sports Medicine at [facility] Counselor from 05/29/2023 in [facility] Office  Visit from 09/29/2022 in  [facility] & [facility] at [facility] Clinical Support from 06/16/2022 in [facility] Primary Care & Sports Medicine at [facility]  PHQ-2 Total Score 1 0 0 0 0  PHQ-9 Total Score 2 0 1 0 --      Flowsheet Row Counselor from 05/29/2023 in [facility] ED from 02/05/2023 in [facility] at [facility] RISK CATEGORY No Risk No Risk        Assessment and Plan:  Rylan Erza Mothershead is a 73 y.o. year old female with a history of depression, arthritis, who presents for follow up appointment for below.   1. MDD (major depressive disorder), recurrent episode, mild (HCC) 2. Anxiety disorder, unspecified type Acute stressors include: loss of her mother May 2024, limited contact with her sister in [place], foot fracture Other stressors include: loss of her father   History     Although she continues to experience occasional depressed mood and anxiety, it has been overall manageable since the last visit. She has started therapy and is actively engaged. Will continue current medication for now. Will continue venlafaxine to target depression, anxiety along with bupropion as adjunctive treatment for depression.    # weight loss She was seen by  PCP a week ago. Lab shows slight worsening in Creatinine.  Colonoscopy was reportedly normal. Will continue to monitor and intervene as needed.   Plan  Continue venlafaxine 225 mg daily   Continue bupropion 450 mg daily Next appointment- 5/9 at 9 30 am for 30 mins, video [email or web address]     Past trials of medication: fluoxetine, citalopram, venlafaxine, bupropion, lithium, Abilify (headache),    The patient demonstrates the following risk factors for suicide: Chronic risk factors for suicide include: psychiatric disorder of depression. Acute risk factors for suicide include: unemployment and social withdrawal/isolation. Protective factors for this patient  include: coping skills and hope for the future. Considering these factors, the overall suicide risk at this point appears to be low. Patient is appropriate for outpatient follow up.  Collaboration of Care: Collaboration of Care: Other reviewed notes in Epic  Patient/Guardian was advised Release of Information must be obtained prior to any record release in order to collaborate their care with an outside provider. Patient/Guardian was advised if they have not already done so to contact the registration department to sign all necessary forms in order for [place] to release information regarding their care.   Consent: Patient/Guardian gives verbal consent for treatment and assignment of benefits for services provided during this visit. Patient/Guardian expressed understanding and agreed to proceed.    Samantha Hotter, MD 07/10/2023, 11:05 AM

## 2023-07-07 ENCOUNTER — Other Ambulatory Visit (HOSPITAL_COMMUNITY): Payer: Self-pay

## 2023-07-07 ENCOUNTER — Ambulatory Visit: Payer: PPO | Admitting: Internal Medicine

## 2023-07-07 ENCOUNTER — Encounter: Payer: Self-pay | Admitting: Internal Medicine

## 2023-07-07 VITALS — BP 108/62 | HR 103 | Ht 64.0 in | Wt 117.0 lb

## 2023-07-07 DIAGNOSIS — Z0000 Encounter for general adult medical examination without abnormal findings: Secondary | ICD-10-CM | POA: Diagnosis not present

## 2023-07-07 DIAGNOSIS — R7303 Prediabetes: Secondary | ICD-10-CM

## 2023-07-07 DIAGNOSIS — Z1231 Encounter for screening mammogram for malignant neoplasm of breast: Secondary | ICD-10-CM

## 2023-07-07 DIAGNOSIS — F3342 Major depressive disorder, recurrent, in full remission: Secondary | ICD-10-CM | POA: Diagnosis not present

## 2023-07-07 DIAGNOSIS — E781 Pure hyperglyceridemia: Secondary | ICD-10-CM | POA: Diagnosis not present

## 2023-07-07 DIAGNOSIS — J4540 Moderate persistent asthma, uncomplicated: Secondary | ICD-10-CM | POA: Diagnosis not present

## 2023-07-07 MED ORDER — TRELEGY ELLIPTA 200-62.5-25 MCG/ACT IN AEPB
1.0000 | INHALATION_SPRAY | Freq: Every day | RESPIRATORY_TRACT | 3 refills | Status: AC
  Filled 2023-07-07: qty 60, 30d supply, fill #0
  Filled 2023-08-08: qty 60, 30d supply, fill #1

## 2023-07-07 NOTE — Assessment & Plan Note (Signed)
Continue dietary modifications

## 2023-07-07 NOTE — Assessment & Plan Note (Signed)
Managed with diet and exercise. Lab Results  Component Value Date   LDLCALC 159 (H) 01/28/2022

## 2023-07-07 NOTE — Assessment & Plan Note (Signed)
Clinically stable on Effexor and Bupropion with good response, No SI or HI reported. No change in management at this time.

## 2023-07-07 NOTE — Progress Notes (Signed)
Date:  07/07/2023   Name:  Kariyah Baugh   DOB:  April 10, 1951   MRN:  [id number]   Chief Complaint: Annual Exam Terrill Kaleisha Bhargava is a 73 y.o. female who presents today for her Complete Annual Exam. She feels well. She reports exercising. She reports she is sleeping well. Breast complaints - none.  She continues to ride her horse but had a recent fall and non displaced fracture of left foot.  Health Maintenance  Topic Date Due   DTaP/Tdap/Td vaccine (2 - Td or Tdap) 05/28/2023   COVID-19 Vaccine (4 - 2024-25 season) 07/23/2023*   Mammogram  07/25/2023   Medicare Annual Wellness Visit  06/23/2024   Cologuard (Stool DNA test)  02/09/2026   Pneumonia Vaccine  Completed   Flu Shot  Completed   DEXA scan (bone density measurement)  Completed   Hepatitis C Screening  Completed   Zoster (Shingles) Vaccine  Completed   HPV Vaccine  Aged Out  *Topic was postponed. The date shown is not the original due date.    Depression        This is a chronic problem.The problem is unchanged.  Associated symptoms include no fatigue and no headaches.  Treatments tried: Effexor and Bupropion. Hyperlipidemia This is a chronic problem. The problem is controlled. Pertinent negatives include no chest pain or shortness of breath. Current antihyperlipidemic treatment includes diet change and exercise.    Review of Systems  Constitutional:  Negative for fatigue and unexpected weight change.  HENT:  Negative for nosebleeds.   Eyes:  Negative for visual disturbance.  Respiratory:  Negative for cough, chest tightness, shortness of breath and wheezing.   Cardiovascular:  Negative for chest pain, palpitations and leg swelling.  Gastrointestinal:  Negative for abdominal pain, constipation and diarrhea.  Neurological:  Negative for dizziness, weakness, light-headedness and headaches.  Psychiatric/Behavioral:  Positive for depression.      Lab Results  Component Value Date   NA 140 01/28/2022   K 4.8  01/28/2022   CO2 26 01/28/2022   GLUCOSE 81 01/28/2022   BUN 25 01/28/2022   CREATININE 1.17 (H) 01/28/2022   CALCIUM 10.8 (H) 01/28/2022   EGFR 50 (L) 01/28/2022   GFRNONAA 56 (L) 12/21/2019   Lab Results  Component Value Date   CHOL 268 (H) 01/28/2022   HDL 90 01/28/2022   LDLCALC 159 (H) 01/28/2022   TRIG 114 01/28/2022   CHOLHDL 3.0 01/28/2022   Lab Results  Component Value Date   TSH 1.630 01/28/2022   Lab Results  Component Value Date   HGBA1C 5.7 (A) 09/29/2022   Lab Results  Component Value Date   WBC 5.6 01/28/2022   HGB 13.4 01/28/2022   HCT 38.4 01/28/2022   MCV 97 01/28/2022   PLT 298 01/28/2022   Lab Results  Component Value Date   ALT 15 01/28/2022   AST 22 01/28/2022   ALKPHOS 89 01/28/2022   BILITOT 0.3 01/28/2022   Lab Results  Component Value Date   VD25OH 35.6 12/21/2019     Patient Active Problem List   Diagnosis Date Noted   Moderate persistent asthma without complication 09/29/2022   Weight loss, unintentional 09/29/2022   Prediabetes 03/17/2022   Shortness of breath 12/16/2018   Recurrent major depressive disorder, in full remission (HCC) 06/24/2018   Osteopenia determined by x-ray 03/19/2016   Hypertriglyceridemia 01/14/2015    No Known Allergies  Past Surgical History:  Procedure Laterality Date   BREAST BIOPSY Right 1998  bx/clip-neg   FRACTURE SURGERY  2017   JOINT REPLACEMENT  08/10/2019   ORBITAL FRACTURE SURGERY  04/25/2015   ORIF ORBITAL FRACTURE Right 04/25/2015   Procedure: OPEN REDUCTION INTERNAL FIXATION (ORIF) ORBITAL FRACTURE;  Surgeon: Geanie Logan, MD;  Location: [facility] ORS;  Service: ENT;  Laterality: Right;   SIGMOIDOSCOPY     TOTAL HIP ARTHROPLASTY Left 08/10/2019    Social History   Tobacco Use   Smoking status: Never   Smokeless tobacco: Never   Tobacco comments:    smoking cessation materials not required  Vaping Use   Vaping status: Never Used  Substance Use Topics   Alcohol use: No     Alcohol/week: 0.0 standard drinks of alcohol   Drug use: No     Medication list has been reviewed and updated.  Current Meds  Medication Sig   albuterol (VENTOLIN HFA) 108 (90 Base) MCG/ACT inhaler Inhale 2 puffs into the lungs every 6 (six) hours as needed for wheezing or shortness of breath.   buPROPion (WELLBUTRIN XL) 150 MG 24 hr tablet Take 1 tablet (150 mg total) by mouth daily.   buPROPion (WELLBUTRIN XL) 300 MG 24 hr tablet Take 1 tablet (300 mg total) by mouth daily. (Take along with 150 mg tab for a total daily dose of 450mg ).   Cholecalciferol (VITAMIN D) 50 MCG (2000 UT) CAPS    Fluticasone-Umeclidin-Vilant (TRELEGY ELLIPTA) 200-62.5-25 MCG/ACT AEPB Inhale 1 Inhalation into the lungs daily at 6 (six) AM.   latanoprost (XALATAN) 0.005 % ophthalmic solution Place 1 drop into both eyes every evening.   venlafaxine XR (EFFEXOR-XR) 150 MG 24 hr capsule Take 1 capsule (150 mg total) by mouth daily.   venlafaxine XR (EFFEXOR-XR) 75 MG 24 hr capsule Take 1 capsule (75 mg total) by mouth daily.       07/07/2023   10:14 AM 05/29/2023   10:06 AM 09/29/2022    4:29 PM 01/28/2022   10:09 AM  GAD 7 : Generalized Anxiety Score  Nervous, Anxious, on Edge 1  0 0  Control/stop worrying 1  0 0  Worry too much - different things 0  0 0  Trouble relaxing 0  0 0  Restless 0  0 0  Easily annoyed or irritable 0  0 0  Afraid - awful might happen 0  0 0  Total GAD 7 Score 2  0 0  Anxiety Difficulty Not difficult at all  Not difficult at all Not difficult at all     Information is confidential and restricted. Go to Review Flowsheets to unlock data.       07/07/2023   10:14 AM 06/24/2023    9:38 AM 05/29/2023   10:06 AM  Depression screen PHQ 2/9  Decreased Interest 0 0   Down, Depressed, Hopeless 1 0   PHQ - 2 Score 1 0   Altered sleeping 0 0   Tired, decreased energy 1 0   Change in appetite 0 0   Feeling bad or failure about yourself  0 0   Trouble concentrating 0 0   Moving slowly or  fidgety/restless 0 0   Suicidal thoughts 0 0   PHQ-9 Score 2 0   Difficult doing work/chores Not difficult at all Not difficult at all      Information is confidential and restricted. Go to Review Flowsheets to unlock data.    BP Readings from Last 3 Encounters:  07/07/23 108/62  03/31/23 (!) 124/55  02/05/23 119/77    Physical  Exam Vitals and nursing note reviewed.  Constitutional:      General: She is not in acute distress.    Appearance: She is well-developed.  HENT:     Head: Normocephalic and atraumatic.     Right Ear: Tympanic membrane and ear canal normal.     Left Ear: Tympanic membrane and ear canal normal.     Nose:     Right Sinus: No maxillary sinus tenderness.     Left Sinus: No maxillary sinus tenderness.  Eyes:     General: No scleral icterus.       Right eye: No discharge.        Left eye: No discharge.     Conjunctiva/sclera: Conjunctivae normal.  Neck:     Thyroid: No thyromegaly.     Vascular: No carotid bruit.  Cardiovascular:     Rate and Rhythm: Normal rate and regular rhythm.     Pulses: Normal pulses.     Heart sounds: Normal heart sounds.  Pulmonary:     Effort: Pulmonary effort is normal. No respiratory distress.     Breath sounds: No wheezing.  Abdominal:     General: Bowel sounds are normal.     Palpations: Abdomen is soft.     Tenderness: There is no abdominal tenderness.  Musculoskeletal:     Cervical back: Normal range of motion. No erythema.     Right lower leg: No edema.     Left lower leg: No edema.  Lymphadenopathy:     Cervical: No cervical adenopathy.  Skin:    General: Skin is warm and dry.     Findings: No rash.  Neurological:     Mental Status: She is alert and oriented to person, place, and time.     Cranial Nerves: No cranial nerve deficit.     Sensory: No sensory deficit.     Deep Tendon Reflexes: Reflexes are normal and symmetric.  Psychiatric:        Attention and Perception: Attention normal.        Mood and  Affect: Mood normal.     Wt Readings from Last 3 Encounters:  07/07/23 117 lb (53.1 kg)  03/31/23 117 lb (53.1 kg)  03/18/23 117 lb (53.1 kg)    BP 108/62   Pulse (!) 103   Ht 5\' 4"  (1.626 m)   Wt 117 lb (53.1 kg)   SpO2 99%   BMI 20.08 kg/m   Assessment and Plan:  Problem List Items Addressed This Visit       Unprioritized   Hypertriglyceridemia (Chronic)   Managed with diet and exercise. Lab Results  Component Value Date   LDLCALC 159 (H) 01/28/2022         Relevant Orders   Lipid panel   Recurrent major depressive disorder, in full remission (HCC) (Chronic)   Clinically stable on Effexor and Bupropion with good response, No SI or HI reported. No change in management at this time.       Relevant Orders   TSH   Prediabetes   Continue dietary modifications      Relevant Orders   Comprehensive metabolic panel   Hemoglobin A1c   Moderate persistent asthma without complication   Asthma stable; not using Advair due to lack of benefit Using albuterol at least twice a day Will try Trelegy once daily;  continue prn albuterol      Relevant Medications   Fluticasone-Umeclidin-Vilant (TRELEGY ELLIPTA) 200-62.5-25 MCG/ACT AEPB   Other Relevant Orders  CBC with Differential/Platelet   Other Visit Diagnoses       Annual physical exam    -  Primary   Weight is stable.  Diet is vegetarian. will continue exercise, calcium and vitamin D     Encounter for screening mammogram for breast cancer       scheduled for March       No follow-ups on file.    Reubin Milan, MD [facility] [place]

## 2023-07-07 NOTE — Assessment & Plan Note (Addendum)
Asthma stable; not using Advair due to lack of benefit Using albuterol at least twice a day Will try Trelegy once daily;  continue prn albuterol

## 2023-07-08 ENCOUNTER — Encounter: Payer: Self-pay | Admitting: Internal Medicine

## 2023-07-08 LAB — LIPID PANEL
Chol/HDL Ratio: 2.9 {ratio} (ref 0.0–4.4)
Cholesterol, Total: 271 mg/dL — ABNORMAL HIGH (ref 100–199)
HDL: 94 mg/dL (ref >39)
LDL Chol Calc (NIH): 148 mg/dL — ABNORMAL HIGH (ref 0–99)
Triglycerides: 166 mg/dL — ABNORMAL HIGH (ref 0–149)
VLDL Cholesterol Cal: 29 mg/dL (ref 5–40)

## 2023-07-08 LAB — COMPREHENSIVE METABOLIC PANEL
ALT: 15 [IU]/L (ref 0–32)
AST: 21 [IU]/L (ref 0–40)
Albumin: 4.4 g/dL (ref 3.8–4.8)
Alkaline Phosphatase: 112 [IU]/L (ref 44–121)
BUN/Creatinine Ratio: 24 (ref 12–28)
BUN: 30 mg/dL — ABNORMAL HIGH (ref 8–27)
Bilirubin Total: 0.3 mg/dL (ref 0.0–1.2)
CO2: 22 mmol/L (ref 20–29)
Calcium: 10.4 mg/dL — ABNORMAL HIGH (ref 8.7–10.3)
Chloride: 101 mmol/L (ref 96–106)
Creatinine, Ser: 1.25 mg/dL — ABNORMAL HIGH (ref 0.57–1.00)
Globulin, Total: 2.4 g/dL (ref 1.5–4.5)
Glucose: 91 mg/dL (ref 70–99)
Potassium: 4.7 mmol/L (ref 3.5–5.2)
Sodium: 143 mmol/L (ref 134–144)
Total Protein: 6.8 g/dL (ref 6.0–8.5)
eGFR: 46 mL/min/{1.73_m2} — ABNORMAL LOW (ref >59)

## 2023-07-08 LAB — CBC WITH DIFFERENTIAL/PLATELET
Basophils Absolute: 0.1 10*3/uL (ref 0.0–0.2)
Basos: 1 %
EOS (ABSOLUTE): 0.1 10*3/uL (ref 0.0–0.4)
Eos: 2 %
Hematocrit: 35.7 % (ref 34.0–46.6)
Hemoglobin: 12.7 g/dL (ref 11.1–15.9)
Immature Grans (Abs): 0 10*3/uL (ref 0.0–0.1)
Immature Granulocytes: 0 %
Lymphocytes Absolute: 1.7 10*3/uL (ref 0.7–3.1)
Lymphs: 30 %
MCH: 35 pg — ABNORMAL HIGH (ref 26.6–33.0)
MCHC: 35.6 g/dL (ref 31.5–35.7)
MCV: 98 fL — ABNORMAL HIGH (ref 79–97)
Monocytes Absolute: 0.4 10*3/uL (ref 0.1–0.9)
Monocytes: 7 %
Neutrophils Absolute: 3.3 10*3/uL (ref 1.4–7.0)
Neutrophils: 60 %
Platelets: 302 10*3/uL (ref 150–450)
RBC: 3.63 x10E6/uL — ABNORMAL LOW (ref 3.77–5.28)
RDW: 11.5 % — ABNORMAL LOW (ref 11.7–15.4)
WBC: 5.5 10*3/uL (ref 3.4–10.8)

## 2023-07-08 LAB — TSH: TSH: 1.5 u[IU]/mL (ref 0.450–4.500)

## 2023-07-08 LAB — HEMOGLOBIN A1C
Est. average glucose Bld gHb Est-mCnc: 128 mg/dL
Hgb A1c MFr Bld: 6.1 % — ABNORMAL HIGH (ref 4.8–5.6)

## 2023-07-09 ENCOUNTER — Ambulatory Visit (INDEPENDENT_AMBULATORY_CARE_PROVIDER_SITE_OTHER): Payer: PPO | Admitting: Professional Counselor

## 2023-07-09 DIAGNOSIS — F419 Anxiety disorder, unspecified: Secondary | ICD-10-CM | POA: Diagnosis not present

## 2023-07-09 DIAGNOSIS — F330 Major depressive disorder, recurrent, mild: Secondary | ICD-10-CM | POA: Diagnosis not present

## 2023-07-09 NOTE — Progress Notes (Unsigned)
   THERAPIST PROGRESS NOTE  Session Time: 11:07 AM - 12:07 PM  Participation Level: {BHH PARTICIPATION LEVEL:22264}  Behavioral Response: {Appearance:22683}{BHH LEVEL OF CONSCIOUSNESS:22305}{BHH MOOD:22306}  Type of Therapy: {CHL AMB BH Type of Therapy:21022741}  Treatment Goals addressed: ***  ProgressTowards Goals: {Progress Towards Goals:21014066}  Interventions: {CHL AMB BH Type of Intervention:21022753}  Summary: Samantha Barton is a 73 y.o. female who presents with isolation, want to give up horse, refocused on mom stuff, struggles with restructuring thinking, doesn't deserve good things wont spend money on self.   Suicidal/Homicidal: {BHH YES OR NO:22294}{yes/no/with/without intent/plan:22693}  Therapist Response: update, problem solve situations, exposure therapy, CBT/cog distortions, biweekly  Plan: Return again in *** weeks.  Diagnosis: No diagnosis found.  Collaboration of Care: {BH OP Collaboration of Care:21014065}  Patient/Guardian was advised Release of Information must be obtained prior to any record release in order to collaborate their care with an outside provider. Patient/Guardian was advised if they have not already done so to contact the registration department to sign all necessary forms in order for [place] to release information regarding their care.   Consent: Patient/Guardian gives verbal consent for treatment and assignment of benefits for services provided during this visit. Patient/Guardian expressed understanding and agreed to proceed.   [name], [facility] 07/09/2023

## 2023-07-10 ENCOUNTER — Other Ambulatory Visit (HOSPITAL_COMMUNITY): Payer: Self-pay

## 2023-07-10 ENCOUNTER — Encounter: Payer: Self-pay | Admitting: Psychiatry

## 2023-07-10 ENCOUNTER — Telehealth: Payer: PPO | Admitting: Psychiatry

## 2023-07-10 DIAGNOSIS — F419 Anxiety disorder, unspecified: Secondary | ICD-10-CM | POA: Diagnosis not present

## 2023-07-10 DIAGNOSIS — F330 Major depressive disorder, recurrent, mild: Secondary | ICD-10-CM

## 2023-07-10 MED ORDER — BUPROPION HCL ER (XL) 300 MG PO TB24
300.0000 mg | ORAL_TABLET | Freq: Every day | ORAL | 1 refills | Status: DC
  Filled 2023-08-26: qty 90, 90d supply, fill #0
  Filled 2023-11-21: qty 90, 90d supply, fill #1

## 2023-07-10 NOTE — Patient Instructions (Signed)
Continue venlafaxine 225 mg daily   Continue bupropion 450 mg daily Next appointment- 5/9 at 9 30 am

## 2023-07-16 ENCOUNTER — Ambulatory Visit: Payer: PPO | Admitting: Professional Counselor

## 2023-07-28 ENCOUNTER — Ambulatory Visit
Admission: RE | Admit: 2023-07-28 | Discharge: 2023-07-28 | Disposition: A | Discharge status: Home / Self Care | Payer: PPO | Source: Ambulatory Visit | Attending: Internal Medicine | Admitting: Internal Medicine

## 2023-07-28 DIAGNOSIS — Z1231 Encounter for screening mammogram for malignant neoplasm of breast: Secondary | ICD-10-CM | POA: Diagnosis not present

## 2023-07-30 ENCOUNTER — Ambulatory Visit (INDEPENDENT_AMBULATORY_CARE_PROVIDER_SITE_OTHER): Payer: Self-pay | Admitting: Professional Counselor

## 2023-07-30 DIAGNOSIS — F330 Major depressive disorder, recurrent, mild: Secondary | ICD-10-CM

## 2023-07-30 NOTE — Progress Notes (Signed)
   THERAPIST PROGRESS NOTE  Session Time: 11:02 AM - 11:55 AM   Participation Level: Active  Behavioral Response: Well GroomedAlertDysphoric  Type of Therapy: Individual Therapy  Treatment Goals addressed: Active OP Depression LTG: "I want to be able to feel more comfortable out in the world, with other people, not be so shy, not be so isolative. I think I want to be more engaged in the world, knowing that I don't have to do it all the time."                Start:  06/11/23    Expected End:  06/09/24      STG: "I want to be able to diminish these thoughts without excusing them. Minimize them where they don't bother me as much" To reduce the impact of trauma AEB processing past, identifying stuck points, and restructuring those beliefs over next 12 weeks.    ProgressTowards Goals: Progressing  Interventions: CBT and Solution Focused  Summary: Samantha Barton is a 73 y.o. female who presents with history of depression.  She appeared dysphoric but oriented x 5.  She was neatly dressed and well-groomed.  She stated she is feeling "sad. "  Samantha Barton reported she still wants to give up her horse but is unsure about if this is a good choice or not.  She engaged in completing a pros and cons.  She was receptive to choice theory and noted this is something she could think about when making her decision.  Samantha Barton reported she has been continuing to try to restructure her negative thinking and noted it has been helpful.  Therapist Response: Conducted session with Samantha Barton. Began session with check-in/update since previous session. Utilized empathetic and reflective listening.  Assisted with completing a pros and cons list for giving up or keeping her horse.  Weighed each option and identified additional pros to keeping the horse.  Discussed choice very and encouraged Samantha Barton to feel comfortable making decisions based on the best information that she has at the time.  Noted she can always find more information before  making a final decision.  Praised Samantha Barton on continuing to use cognitive behavior therapy to change her negative thinking and encouraged her to continue practicing this.  Scheduled additional appointment and concluded session.   Suicidal/Homicidal: No  Plan: Return again in 2 weeks.  Diagnosis: MDD (major depressive disorder), recurrent episode, mild (HCC)  Collaboration of Care: Medication Management AEB chart review  Patient/Guardian was advised Release of Information must be obtained prior to any record release in order to collaborate their care with an outside provider. Patient/Guardian was advised if they have not already done so to contact the registration department to sign all necessary forms in order for [place] to release information regarding their care.   Consent: Patient/Guardian gives verbal consent for treatment and assignment of benefits for services provided during this visit. Patient/Guardian expressed understanding and agreed to proceed.   [name], [facility] 07/30/2023

## 2023-08-13 ENCOUNTER — Ambulatory Visit: Payer: PPO | Admitting: Professional Counselor

## 2023-08-26 ENCOUNTER — Other Ambulatory Visit (HOSPITAL_COMMUNITY): Payer: Self-pay

## 2023-09-02 ENCOUNTER — Ambulatory Visit (INDEPENDENT_AMBULATORY_CARE_PROVIDER_SITE_OTHER): Admitting: Professional Counselor

## 2023-09-02 DIAGNOSIS — F419 Anxiety disorder, unspecified: Secondary | ICD-10-CM | POA: Diagnosis not present

## 2023-09-02 NOTE — Progress Notes (Unsigned)
  THERAPIST PROGRESS NOTE  Session Time: 10:00 AM - 11:00 AM  Participation Level: Active  Behavioral Response: Well GroomedAlertAnxious  Type of Therapy: Individual Therapy  Treatment Goals addressed: ***  ProgressTowards Goals: Progressing  Interventions: {CHL AMB BH Type of Intervention:21022753}  Summary: Shakirah Marieanne Marxen is a 73 y.o. female who presents with ***. Sold horse, lease pony, so happy, pickle ball, didn't do well, foster cat, money situation, health situation, denies factual evidence   Suicidal/Homicidal: No  Therapist Response: Conducted session with . Began session with check-in/update since previous session. Utilized empathetic and reflective listening. Scheduled additional appointment and concluded session.   Plan: Return again in 2 weeks.  Diagnosis: No diagnosis found.  Collaboration of Care: Medication Management AEB chart review   Patient/Guardian was advised Release of Information must be obtained prior to any record release in order to collaborate their care with an outside provider. Patient/Guardian was advised if they have not already done so to contact the registration department to sign all necessary forms in order for [place] to release information regarding their care.   Consent: Patient/Guardian gives verbal consent for treatment and assignment of benefits for services provided during this visit. Patient/Guardian expressed understanding and agreed to proceed.   [name], [facility] 09/02/2023

## 2023-09-15 ENCOUNTER — Ambulatory Visit (INDEPENDENT_AMBULATORY_CARE_PROVIDER_SITE_OTHER): Admitting: Professional Counselor

## 2023-09-15 DIAGNOSIS — F330 Major depressive disorder, recurrent, mild: Secondary | ICD-10-CM

## 2023-09-15 NOTE — Progress Notes (Signed)
  THERAPIST PROGRESS NOTE  Session Time: 10:00 AM - 10:56 AM   Participation Level: Active  Behavioral Response: Well Groomed, Alert, Anxious and Dysphoric  Type of Therapy: Individual Therapy  Treatment Goals addressed: Active OP Depression LTG: "I want to be able to feel more comfortable out in the world, with other people, not be so shy, not be so isolative. I think I want to be more engaged in the world, knowing that I don't have to do it all the time."                Start:  06/11/23    Expected End:  06/09/24      STG: "I want to be able to diminish these thoughts without excusing them. Minimize them where they don't bother me as much" To reduce the impact of trauma AEB processing past, identifying stuck points, and restructuring those beliefs over next 12 weeks.    ProgressTowards Goals: Progressing  Interventions: CBT and Supportive  Summary: Samantha Barton is a 73 y.o. female who presents with a history of anxiety and depression. She appeared alert and oriented x5. She reported she has been staying busy everyday but she still hasn't been very social. She discussed activities she's engaged in and upcoming plans. She was resistant to various ways she could meet a possible companion. Samantha Barton noted her level of comfort and needing a "mediator" such as pickle ball or book club, when socializing with others as opposed to dating. She discussed issues with body dysphoria and negative thoughts about her attractiveness. She was somewhat receptive to body positivity exercise and affirmations, although she feels as though she is deceiving herself by saying other things about her image. Ever is open to trying though.  Therapist Response: Conducted session with Samantha Barton. Began session with check-in/update since previous session. Utilized empathetic and reflective listening. Explored [name]'s thoughts/feelings about day-to-day life and want for companionship. Attempted to explore options for meeting people.  Explained body positivity exercise and ways to restructure negative thoughts about attractiveness. Normalized the disconnect between the thought and feelings initially. Encouraged [name] to practice anyway. Scheduled additional appointment and concluded session.   Suicidal/Homicidal: No  Plan: Return again in 4 weeks.  Diagnosis: MDD (major depressive disorder), recurrent episode, mild (HCC)  Collaboration of Care: Medication Management AEB chart review  Patient/Guardian was advised Release of Information must be obtained prior to any record release in order to collaborate their care with an outside provider. Patient/Guardian was advised if they have not already done so to contact the registration department to sign all necessary forms in order for us  to release information regarding their care.   Consent: Patient/Guardian gives verbal consent for treatment and assignment of benefits for services provided during this visit. Patient/Guardian expressed understanding and agreed to proceed.   Len Quale, [facility] 09/15/2023
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
## 2023-10-02 ENCOUNTER — Encounter: Payer: Self-pay | Admitting: Psychiatry

## 2023-10-02 ENCOUNTER — Telehealth (INDEPENDENT_AMBULATORY_CARE_PROVIDER_SITE_OTHER): Payer: PPO | Admitting: Psychiatry

## 2023-10-02 DIAGNOSIS — F3341 Major depressive disorder, recurrent, in partial remission: Secondary | ICD-10-CM

## 2023-10-02 DIAGNOSIS — F419 Anxiety disorder, unspecified: Secondary | ICD-10-CM

## 2023-10-02 NOTE — Patient Instructions (Signed)
 Continue venlafaxine  225 mg daily   Continue bupropion  450 mg daily Next appointment- 7/25 at 10 am

## 2023-10-08 DIAGNOSIS — L814 Other melanin hyperpigmentation: Secondary | ICD-10-CM | POA: Diagnosis not present

## 2023-10-08 DIAGNOSIS — D225 Melanocytic nevi of trunk: Secondary | ICD-10-CM | POA: Diagnosis not present

## 2023-10-08 DIAGNOSIS — L821 Other seborrheic keratosis: Secondary | ICD-10-CM | POA: Diagnosis not present

## 2023-10-08 DIAGNOSIS — D2272 Melanocytic nevi of left lower limb, including hip: Secondary | ICD-10-CM | POA: Diagnosis not present

## 2023-10-08 DIAGNOSIS — D2262 Melanocytic nevi of left upper limb, including shoulder: Secondary | ICD-10-CM | POA: Diagnosis not present

## 2023-10-08 DIAGNOSIS — L738 Other specified follicular disorders: Secondary | ICD-10-CM | POA: Diagnosis not present

## 2023-10-13 ENCOUNTER — Other Ambulatory Visit (HOSPITAL_COMMUNITY): Payer: Self-pay

## 2023-10-13 DIAGNOSIS — H401122 Primary open-angle glaucoma, left eye, moderate stage: Secondary | ICD-10-CM | POA: Diagnosis not present

## 2023-10-13 DIAGNOSIS — H2513 Age-related nuclear cataract, bilateral: Secondary | ICD-10-CM | POA: Diagnosis not present

## 2023-10-13 MED ORDER — LATANOPROST 0.005 % OP SOLN
1.0000 [drp] | Freq: Every evening | OPHTHALMIC | 12 refills | Status: AC
  Filled 2023-10-13: qty 5, 100d supply, fill #0
  Filled 2023-12-29: qty 2.5, 25d supply, fill #1
  Filled 2024-02-19: qty 5, 100d supply, fill #2
  Filled 2024-02-22: qty 5, 50d supply, fill #2
  Filled 2024-02-24 – 2024-03-08 (×2): qty 2.5, 25d supply, fill #2
  Filled 2024-04-08: qty 2.5, 25d supply, fill #3
  Filled 2024-05-09: qty 2.5, 25d supply, fill #4
  Filled 2024-06-03: qty 2.5, 25d supply, fill #5
  Filled 2024-06-28: qty 2.5, 25d supply, fill #6

## 2023-10-14 ENCOUNTER — Ambulatory Visit (INDEPENDENT_AMBULATORY_CARE_PROVIDER_SITE_OTHER): Admitting: Professional Counselor

## 2023-10-14 DIAGNOSIS — F4321 Adjustment disorder with depressed mood: Secondary | ICD-10-CM

## 2023-10-14 NOTE — Progress Notes (Signed)
  THERAPIST PROGRESS NOTE  Session Time: 2:00 - 2:55 PM  Participation Level: Active  Behavioral Response: Well Groomed, Alert, Euthymic  Type of Therapy: Individual Therapy  Treatment Goals addressed: Active OP Depression LTG: "I want to be able to feel more comfortable out in the world, with other people, not be so shy, not be so isolative. I think I want to be more engaged in the world, knowing that I don't have to do it all the time."                Start:  06/11/23    Expected End:  06/09/24      STG: "I want to be able to diminish these thoughts without excusing them. Minimize them where they don't bother me as much" To reduce the impact of trauma AEB processing past, identifying stuck points, and restructuring those beliefs over next 12 weeks.    ProgressTowards Goals: Met  Interventions: Motivational Interviewing and Supportive  Summary: Daaiyah Diane Cathy is a 73 y.o. female who presents with a history of anxiety and depression. She appeared alert and oriented x5. She provided updates on how things are going. She has gotten her pony and continues to enjoy spending time at the barn. Alyzah noted other things she enjoys doing and ways she has been getting socialization. She engaged in discussion about previous travels and how she would like to go on more, but isn't expressed uncertainty about doing that at this time. She reported things are going well and after reviewing her treatment plan, agreed she has met her goals. Alysah will be discharged from therapy at this time. She is aware if circumstances change, she may request to resume services at that time.  Therapist Response: Conducted session with Taniyah. Began session with check-in/update since previous session. Utilized empathetic and reflective listening. Used open-ended questions to facilitate discussion and summarized Torey's thoughts/feelings. Reviewed treatment plan and agreed Sarahlynn has met her goals. Discussed plan for successful discharge  and concluded session.   Suicidal/Homicidal: No  Plan: Successful discharge from therapy  Diagnosis: Adjustment disorder with depressed mood  Collaboration of Care: Medication Management AEB chart review  Patient/Guardian was advised Release of Information must be obtained prior to any record release in order to collaborate their care with an outside provider. Patient/Guardian was advised if they have not already done so to contact the registration department to sign all necessary forms in order for us  to release information regarding their care.   Consent: Patient/Guardian gives verbal consent for treatment and assignment of benefits for services provided during this visit. Patient/Guardian expressed understanding and agreed to proceed.   Len Quale, [facility] 10/14/2023

## 2023-10-20 ENCOUNTER — Other Ambulatory Visit (HOSPITAL_COMMUNITY): Payer: Self-pay

## 2023-11-02 ENCOUNTER — Ambulatory Visit: Admitting: Professional Counselor

## 2023-11-06 ENCOUNTER — Other Ambulatory Visit: Payer: Self-pay | Admitting: Internal Medicine

## 2023-11-06 ENCOUNTER — Other Ambulatory Visit: Payer: Self-pay

## 2023-11-06 DIAGNOSIS — J4540 Moderate persistent asthma, uncomplicated: Secondary | ICD-10-CM

## 2023-11-09 ENCOUNTER — Other Ambulatory Visit (HOSPITAL_COMMUNITY): Payer: Self-pay

## 2023-11-09 MED ORDER — ALBUTEROL SULFATE HFA 108 (90 BASE) MCG/ACT IN AERS
2.0000 | INHALATION_SPRAY | Freq: Four times a day (QID) | RESPIRATORY_TRACT | 2 refills | Status: DC | PRN
  Filled 2023-11-09: qty 6.7, 25d supply, fill #0
  Filled 2023-12-29: qty 6.7, 25d supply, fill #1
  Filled 2024-04-08: qty 6.7, 25d supply, fill #2

## 2023-11-09 NOTE — Telephone Encounter (Signed)
 Requested Prescriptions  Pending Prescriptions Disp Refills   albuterol  (VENTOLIN  HFA) 108 (90 Base) MCG/ACT inhaler 6.7 g 2    Sig: Inhale 2 puffs into the lungs every 6 (six) hours as needed for wheezing or shortness of breath.     Pulmonology:  Beta Agonists 2 Passed - 11/09/2023  1:39 PM      Passed - Last BP in normal range    BP Readings from Last 1 Encounters:  07/07/23 108/62         Passed - Last Heart Rate in normal range    Pulse Readings from Last 1 Encounters:  07/07/23 (!) 103         Passed - Valid encounter within last 12 months    Recent Outpatient Visits           4 months ago Annual physical exam   [facility] at [facility], Chales [place], MD

## 2023-12-13 NOTE — Progress Notes (Unsigned)
 Virtual Visit via Video Note  I connected with Samantha Barton on 12/18/23 at 10:00 AM EDT by a video enabled telemedicine application and verified that I am speaking with the correct person using two identifiers.  Location: Patient: home Provider: home office Persons participated in the visit- patient, provider    I discussed the limitations of evaluation and management by telemedicine and the availability of in person appointments. The patient expressed understanding and agreed to proceed.   I discussed the assessment and treatment plan with the patient. The patient was provided an opportunity to ask questions and all were answered. The patient agreed with the plan and demonstrated an understanding of the instructions.   The patient was advised to call back or seek an in-person evaluation if the symptoms worsen or if the condition fails to improve as anticipated.   Katheren Sleet, MD    [facility] MD/PA/NP OP Progress Note  12/18/2023 10:41 AM Samantha Barton  MRN:  [id number]  Chief Complaint:  Chief Complaint  Patient presents with   Follow-up   HPI:  This is a follow-up appointment for depression and anxiety.  She states that she is existing.  She is busy with painting, walking, and solitary activities.  She does not feel joy, although she is not in misery.  She usually feels joy when she has some relationship, although she also feels content, and she feels fine doing things alone.  Although she tried to engage other people when she rides horses, it did not work. She feels like it is compartmentalized she also noticed that her ability to communicate with other people gets rusty.  She does not feel depressed or sad or lonely.  She will be starting a group for watercolors once a month.  She states that she is always trying.  She denies SI, HI, hallucinations.  She still has a few episode of vomiting, although it has been improving.  She thinks it is stress related.  She wants to stay on the  medication as long as it works.   Visit Diagnosis:    ICD-10-CM   1. MDD (major depressive disorder), recurrent, in partial remission (HCC)  F33.41     2. Adjustment disorder with depressed mood  F43.21       Past Psychiatric History: Please see initial evaluation for full details. I have reviewed the history. No updates at this time.     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   Fracture, orbital (HCC) 06/2015   Right Eye -Horse ACCIDENT   Hand fracture, right 06/2015   Horse Accident    Hypertriglyceridemia     Past Surgical History:  Procedure Laterality Date   BREAST BIOPSY Right 1998   bx/clip-neg   FRACTURE SURGERY  2017   JOINT REPLACEMENT  08/10/2019   ORBITAL FRACTURE SURGERY  04/25/2015   ORIF ORBITAL FRACTURE Right 04/25/2015   Procedure: OPEN REDUCTION INTERNAL FIXATION (ORIF) ORBITAL FRACTURE;  Surgeon: Deward Dolly, MD;  Location: [facility] ORS;  Service: ENT;  Laterality: Right;   SIGMOIDOSCOPY     TOTAL HIP ARTHROPLASTY Left 08/10/2019    Family Psychiatric History: Please see initial evaluation for full details. I have reviewed the history. No updates at this time.     Family History:  Family History  Problem Relation Age of Onset   Healthy Mother    Diabetes  Father    Breast cancer Neg Hx    Colon cancer Neg Hx    Rectal cancer Neg Hx    Stomach cancer Neg Hx    Esophageal cancer Neg Hx     Social History:  Social History   Socioeconomic History   Marital status: Single    Spouse name: Not on file   Number of children: 0   Years of education: Not on file   Highest education level: Master's degree (e.g., MA, MS, MEng, MEd, MSW, MBA)  Occupational History   Occupation: Retired  Tobacco Use   Smoking status: Never   Smokeless tobacco: Never   Tobacco comments:    smoking cessation materials not required  Vaping Use   Vaping  status: Never Used  Substance and Sexual Activity   Alcohol use: No    Alcohol/week: 0.0 standard drinks of alcohol   Drug use: No   Sexual activity: Not Currently    Birth control/protection: Post-menopausal  Other Topics Concern   Not on file  Social History Narrative   Not on file   Social Drivers of Health   Financial Resource Strain: Low Risk  (06/24/2023)   Overall Financial Resource Strain (CARDIA)    Difficulty of Paying Living Expenses: Not hard at all  Food Insecurity: No Food Insecurity (06/24/2023)   Hunger Vital Sign    Worried About Running Out of Food in the Last Year: Never true    Ran Out of Food in the Last Year: Never true  Transportation Needs: No Transportation Needs (06/24/2023)   PRAPARE - Administrator, Civil Service (Medical): No    Lack of Transportation (Non-Medical): No  Physical Activity: Sufficiently Active (06/24/2023)   Exercise Vital Sign    Days of Exercise per Week: 3 days    Minutes of Exercise per Session: 60 min  Stress: No Stress Concern Present (06/24/2023)   [employer] of Occupational Health - Occupational Stress Questionnaire    Feeling of Stress : Not at all  Social Connections: Socially Isolated (06/24/2023)   Social Connection and Isolation Panel    Frequency of Communication with Friends and Family: Once a week    Frequency of Social Gatherings with Friends and Family: Never    Attends Religious Services: Never    Database administrator or Organizations: No    Attends Engineer, structural: Never    Marital Status: Divorced    Allergies: No Known Allergies  Metabolic Disorder Labs: Lab Results  Component Value Date   HGBA1C 6.1 (H) 07/07/2023   No results found for: PROLACTIN Lab Results  Component Value Date   CHOL 271 (H) 07/07/2023   TRIG 166 (H) 07/07/2023   HDL 94 07/07/2023   CHOLHDL 2.9 07/07/2023   LDLCALC 148 (H) 07/07/2023   LDLCALC 159 (H) 01/28/2022   Lab Results  Component  Value Date   TSH 1.500 07/07/2023   TSH 1.630 01/28/2022    Therapeutic Level Labs: No results found for: LITHIUM No results found for: VALPROATE No results found for: CBMZ  Current Medications: Current Outpatient Medications  Medication Sig Dispense Refill   albuterol  (VENTOLIN  HFA) 108 (90 Base) MCG/ACT inhaler Inhale 2 puffs into the lungs every 6 (six) hours as needed for wheezing or shortness of breath. 6.7 g 2   [START ON 12/29/2023] buPROPion  (WELLBUTRIN  XL) 150 MG 24 hr tablet Take 1 tablet (150 mg total) by mouth daily. 90 tablet 1   buPROPion  (WELLBUTRIN  XL) 300  MG 24 hr tablet Take 1 tablet (300 mg total) by mouth daily. (Take along with 150 mg tab for a total daily dose of 450mg ). 90 tablet 1   Cholecalciferol (VITAMIN D) 50 MCG (2000 UT) CAPS      Fluticasone -Umeclidin-Vilant (TRELEGY ELLIPTA ) 200-62.5-25 MCG/ACT AEPB Inhale 1 Inhalation into the lungs daily at 6 (six) AM. 60 each 3   latanoprost  (XALATAN ) 0.005 % ophthalmic solution Place 1 drop into both eyes daily every evening. 5 mL 12   venlafaxine  XR (EFFEXOR -XR) 150 MG 24 hr capsule Take 1 capsule (150 mg total) by mouth daily. 90 capsule 3   venlafaxine  XR (EFFEXOR -XR) 75 MG 24 hr capsule Take 1 capsule (75 mg total) by mouth daily. 90 capsule 3   No current facility-administered medications for this visit.     Musculoskeletal: Strength & Muscle Tone: N/A Gait & Station: N/A Patient leans: N/A  Psychiatric Specialty Exam: Review of Systems  Psychiatric/Behavioral:  Negative for agitation, behavioral problems, confusion, decreased concentration, dysphoric mood, hallucinations, self-injury, sleep disturbance and suicidal ideas. The patient is nervous/anxious. The patient is not hyperactive.   All other systems reviewed and are negative.   There were no vitals taken for this visit.There is no height or weight on file to calculate BMI.  General Appearance: Well Groomed  Eye Contact:  Good  Speech:  Clear  and Coherent  Volume:  Normal  Mood:  good  Affect:  Appropriate, Congruent, and Full Range  Thought Process:  Coherent  Orientation:  Full (Time, Place, and Person)  Thought Content: Logical   Suicidal Thoughts:  No  Homicidal Thoughts:  No  Memory:  Immediate;   Good  Judgement:  Good  Insight:  Good  Psychomotor Activity:  Normal  Concentration:  Concentration: Good and Attention Span: Good  Recall:  Good  Fund of Knowledge: Good  Language: Good  Akathisia:  No  Handed:  Right  AIMS (if indicated): not done  Assets:  Communication Skills Desire for Improvement  ADL's:  Intact  Cognition: WNL  Sleep:  Fair   Screenings: GAD-7    Flowsheet Row Office Visit from 07/07/2023 in [facility] Care & Sports Medicine at [facility] Counselor from 05/29/2023 in [facility] Office Visit from 09/29/2022 in [facility] Primary Care & Sports Medicine at [facility] Office Visit from 01/28/2022 in [facility] Care & [facility] Medicine at [facility] Office Visit from 08/23/2021 in [facility] Primary Care & Sports Medicine at [facility]  Total GAD-7 Score 2 0 0 0 0   PHQ2-9    Flowsheet Row Office Visit from 07/07/2023 in [facility] Primary Care & Sports Medicine at [facility] Clinical Support from 06/24/2023 in [facility] Primary Care & Sports Medicine at [facility] Counselor from 05/29/2023 in [facility] Office Visit from 09/29/2022 in [facility] Primary Care & Sports Medicine at [facility] Clinical Support from 06/16/2022 in [facility] Primary Care & Sports Medicine at [facility]  PHQ-2 Total Score 1 0 0 0 0  PHQ-9 Total Score 2 0 1 0 --   Flowsheet Row Counselor from 05/29/2023 in [facility] UC from 02/05/2023 in [facility] at [facility] RISK CATEGORY No Risk No Risk     Assessment and Plan:   Samantha Barton is a 74 y.o. year old female with a history of depression, arthritis, who presents for follow up  appointment for below.    1. MDD (major depressive disorder), recurrent, in partial remission (HCC) 2. 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 Since relocating to [place]  several years ago, she has experienced a persistent sense of loneliness. While she acknowledges that her family loves her, she reports a lack of emotional nurturing during childhood, contributing to longstanding feelings of insecurity. She is grieving the loss of her father in 2023 and her mother in May 2024, and notes that her relationship with her sister has become distant since these losses.   She denies any depressive symptoms.  Although she reports self-limited anxiety in the setting of communication with others, but this is minimal.  While she expressed a longing for connection, she also noted feeling content in solitude. She plans to join a painting group and is open to seeing how the experience unfolds. Will continue venlafaxine  to target depression and anxiety.  Will continue bupropion  as adjunctive treatment for depression.    # vomiting Overall improving. She has sporadic vomiting without any triggers.  Her primary care reportedly attributes this to psychogenic.  Will continue to assess as needed.    Plan  Continue venlafaxine  225 mg daily   Continue bupropion  450 mg daily Next appointment- 9/19 at 10 30, video [email or web address]     Past trials of medication: fluoxetine, citalopram, venlafaxine , bupropion , lithium, Abilify  (headache),    The patient demonstrates the following risk factors for suicide: Chronic risk factors for suicide include: psychiatric disorder of depression. Acute risk factors for suicide include: unemployment and social withdrawal/isolation. Protective factors for this patient include: coping skills and hope for  the future. Considering these factors, the overall suicide risk at this point appears to be low. Patient is appropriate for outpatient follow up.  Collaboration of Care: Collaboration of Care: Other reviewed notes in Epic  Patient/Guardian was advised Release of Information must be obtained prior to any record release in order to collaborate their care with an outside provider. Patient/Guardian was advised if they have not already done so to contact the registration department to sign all necessary forms in order for us  to release information regarding their care.   Consent: Patient/Guardian gives verbal consent for treatment and assignment of benefits for services provided during this visit. Patient/Guardian expressed understanding and agreed to proceed.    Katheren Sleet, MD 12/18/2023, 10:41 AM

## 2023-12-18 ENCOUNTER — Encounter: Payer: Self-pay | Admitting: Psychiatry

## 2023-12-18 ENCOUNTER — Other Ambulatory Visit (HOSPITAL_COMMUNITY): Payer: Self-pay

## 2023-12-18 ENCOUNTER — Telehealth: Admitting: Psychiatry

## 2023-12-18 DIAGNOSIS — F4321 Adjustment disorder with depressed mood: Secondary | ICD-10-CM | POA: Diagnosis not present

## 2023-12-18 DIAGNOSIS — F3341 Major depressive disorder, recurrent, in partial remission: Secondary | ICD-10-CM | POA: Diagnosis not present

## 2023-12-18 MED ORDER — BUPROPION HCL ER (XL) 150 MG PO TB24
150.0000 mg | ORAL_TABLET | Freq: Every day | ORAL | 1 refills | Status: DC
  Filled 2023-12-29: qty 90, 90d supply, fill #0
  Filled 2024-03-24: qty 90, 90d supply, fill #1
  Filled ????-??-??: fill #0

## 2023-12-18 NOTE — Patient Instructions (Signed)
 Continue venlafaxine  225 mg daily   Continue bupropion  450 mg daily Next appointment- 9/19 at 10 30

## 2023-12-29 ENCOUNTER — Other Ambulatory Visit (HOSPITAL_COMMUNITY): Payer: Self-pay

## 2024-01-12 DIAGNOSIS — H25812 Combined forms of age-related cataract, left eye: Secondary | ICD-10-CM | POA: Diagnosis not present

## 2024-01-12 DIAGNOSIS — H401122 Primary open-angle glaucoma, left eye, moderate stage: Secondary | ICD-10-CM | POA: Diagnosis not present

## 2024-01-12 DIAGNOSIS — Z01818 Encounter for other preprocedural examination: Secondary | ICD-10-CM | POA: Diagnosis not present

## 2024-02-01 ENCOUNTER — Other Ambulatory Visit (HOSPITAL_COMMUNITY): Payer: Self-pay

## 2024-02-01 MED ORDER — PREDNISOLONE ACETATE 1 % OP SUSP
OPHTHALMIC | 1 refills | Status: AC
  Filled 2024-02-01: qty 5, 28d supply, fill #0
  Filled 2024-02-19: qty 5, 33d supply, fill #1

## 2024-02-04 ENCOUNTER — Other Ambulatory Visit: Payer: Self-pay | Admitting: Internal Medicine

## 2024-02-04 DIAGNOSIS — F3342 Major depressive disorder, recurrent, in full remission: Secondary | ICD-10-CM

## 2024-02-05 ENCOUNTER — Other Ambulatory Visit: Payer: Self-pay

## 2024-02-05 ENCOUNTER — Other Ambulatory Visit (HOSPITAL_COMMUNITY): Payer: Self-pay

## 2024-02-05 MED ORDER — VENLAFAXINE HCL ER 75 MG PO CP24
75.0000 mg | ORAL_CAPSULE | Freq: Every day | ORAL | 0 refills | Status: DC
  Filled 2024-02-05: qty 90, 90d supply, fill #0

## 2024-02-05 NOTE — Telephone Encounter (Signed)
 LOV 07/07/23 for CPE. OFFICE VISIT NEEDED FOR ADDITIONAL REFILLS   Requested Prescriptions  Pending Prescriptions Disp Refills   venlafaxine  XR (EFFEXOR -XR) 75 MG 24 hr capsule 90 capsule 0    Sig: Take 1 capsule (75 mg total) by mouth daily.     Psychiatry: Antidepressants - SNRI - desvenlafaxine & venlafaxine  Failed - 02/05/2024  9:50 AM      Failed - Cr in normal range and within 360 days    Creatinine, Ser  Date Value Ref Range Status  07/07/2023 1.25 (H) 0.57 - 1.00 mg/dL Final         Failed - Valid encounter within last 6 months    Recent Outpatient Visits           7 months ago Annual physical exam   [place] [facility] at [facility], Leita DEL, MD              Failed - Lipid Panel in normal range within the last 12 months    Cholesterol, Total  Date Value Ref Range Status  07/07/2023 271 (H) 100 - 199 mg/dL Final   LDL Chol Calc (NIH)  Date Value Ref Range Status  07/07/2023 148 (H) 0 - 99 mg/dL Final   HDL  Date Value Ref Range Status  07/07/2023 94 >39 mg/dL Final   Triglycerides  Date Value Ref Range Status  07/07/2023 166 (H) 0 - 149 mg/dL Final         Passed - Completed PHQ-2 or PHQ-9 in the last 360 days      Passed - Last BP in normal range    BP Readings from Last 1 Encounters:  07/07/23 108/62

## 2024-02-07 NOTE — Progress Notes (Signed)
 Virtual Visit via Video Note  I connected with Samantha Barton on 02/12/24 at 10:30 AM EDT by a video enabled telemedicine application and verified that I am speaking with the correct person using two identifiers.  Location: Patient: home Provider: home office Persons participated in the visit- patient, provider    I discussed the limitations of evaluation and management by telemedicine and the availability of in person appointments. The patient expressed understanding and agreed to proceed.    I discussed the assessment and treatment plan with the patient. The patient was provided an opportunity to ask questions and all were answered. The patient agreed with the plan and demonstrated an understanding of the instructions.   The patient was advised to call back or seek an in-person evaluation if the symptoms worsen or if the condition fails to improve as anticipated.   Katheren Sleet, MD    [facility] MD/PA/NP OP Progress Note  02/12/2024 11:50 AM Samantha Barton  MRN:  [id number]  Chief Complaint:  Chief Complaint  Patient presents with   Follow-up   HPI:  This is a follow-up appointment for depression.  She states that depression is coming back.  She woke up feeling sad.  She has hard time making decisions.  Although she does leave the house, she does not want to leave.  She is not enjoying things anymore.  She feels exhausted.  She has been feeling this way for the past week since having cataract surgery.  She had to pay somebody for transportation.  She felt vulnerable.  This reminded her of how long she is.  Although she contacted her sister, she responded good luck and did not offer any further support.  She could not ask the neighbor as she was not sure if she could help on the day if she wanted to have the surgery.  She also reports feeling guilty if she were to ask for help.  She has initial insomnia.  She feels like she is not part of the word.  She denies SI, HI, hallucinations.   She denies alcohol use or drug use.    Visit Diagnosis:    ICD-10-CM   1. MDD (major depressive disorder), recurrent episode, mild (HCC)  F33.0       Past Psychiatric History: Please see initial evaluation for full details. I have reviewed the history. No updates at this time.     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   Fracture, orbital (HCC) 06/2015   Right Eye -Horse ACCIDENT   Hand fracture, right 06/2015   Horse Accident    Hypertriglyceridemia     Past Surgical History:  Procedure Laterality Date   BREAST BIOPSY Right 1998   bx/clip-neg   FRACTURE SURGERY  2017   JOINT REPLACEMENT  08/10/2019   ORBITAL FRACTURE SURGERY  04/25/2015   ORIF ORBITAL FRACTURE Right 04/25/2015   Procedure: OPEN REDUCTION INTERNAL FIXATION (ORIF) ORBITAL FRACTURE;  Surgeon: Deward Dolly, MD;  Location: [facility] ORS;  Service: ENT;  Laterality: Right;   SIGMOIDOSCOPY     TOTAL HIP ARTHROPLASTY Left 08/10/2019    Family Psychiatric History: Please see initial evaluation for full details. I have reviewed the history. No updates at this time.    Family History:  Family History  Problem Relation Age of Onset   Healthy Mother    Diabetes Father  Breast cancer Neg Hx    Colon cancer Neg Hx    Rectal cancer Neg Hx    Stomach cancer Neg Hx    Esophageal cancer Neg Hx     Social History:  Social History   Socioeconomic History   Marital status: Single    Spouse name: Not on file   Number of children: 0   Years of education: Not on file   Highest education level: Master's degree (e.g., MA, MS, MEng, MEd, MSW, MBA)  Occupational History   Occupation: Retired  Tobacco Use   Smoking status: Never   Smokeless tobacco: Never   Tobacco comments:    smoking cessation materials not required  Vaping Use   Vaping status: Never Used  Substance and Sexual Activity   Alcohol  use: No    Alcohol/week: 0.0 standard drinks of alcohol   Drug use: No   Sexual activity: Not Currently    Birth control/protection: Post-menopausal  Other Topics Concern   Not on file  Social History Narrative   Not on file   Social Drivers of Health   Financial Resource Strain: Low Risk  (06/24/2023)   Overall Financial Resource Strain (CARDIA)    Difficulty of Paying Living Expenses: Not hard at all  Food Insecurity: No Food Insecurity (06/24/2023)   Hunger Vital Sign    Worried About Running Out of Food in the Last Year: Never true    Ran Out of Food in the Last Year: Never true  Transportation Needs: No Transportation Needs (06/24/2023)   PRAPARE - Administrator, Civil Service (Medical): No    Lack of Transportation (Non-Medical): No  Physical Activity: Sufficiently Active (06/24/2023)   Exercise Vital Sign    Days of Exercise per Week: 3 days    Minutes of Exercise per Session: 60 min  Stress: No Stress Concern Present (06/24/2023)   [employer] of Occupational Health - Occupational Stress Questionnaire    Feeling of Stress : Not at all  Social Connections: Socially Isolated (06/24/2023)   Social Connection and Isolation Panel    Frequency of Communication with Friends and Family: Once a week    Frequency of Social Gatherings with Friends and Family: Never    Attends Religious Services: Never    Database administrator or Organizations: No    Attends Engineer, structural: Never    Marital Status: Divorced    Allergies: No Known Allergies  Metabolic Disorder Labs: Lab Results  Component Value Date   HGBA1C 6.1 (H) 07/07/2023   No results found for: PROLACTIN Lab Results  Component Value Date   CHOL 271 (H) 07/07/2023   TRIG 166 (H) 07/07/2023   HDL 94 07/07/2023   CHOLHDL 2.9 07/07/2023   LDLCALC 148 (H) 07/07/2023   LDLCALC 159 (H) 01/28/2022   Lab Results  Component Value Date   TSH 1.500 07/07/2023   TSH 1.630 01/28/2022     Therapeutic Level Labs: No results found for: LITHIUM No results found for: VALPROATE No results found for: CBMZ  Current Medications: Current Outpatient Medications  Medication Sig Dispense Refill   albuterol  (VENTOLIN  HFA) 108 (90 Base) MCG/ACT inhaler Inhale 2 puffs into the lungs every 6 (six) hours as needed for wheezing or shortness of breath. 6.7 g 2   buPROPion  (WELLBUTRIN  XL) 150 MG 24 hr tablet Take 1 tablet (150 mg total) by mouth daily. (12/29/23) 90 tablet 1   buPROPion  (WELLBUTRIN  XL) 300 MG 24 hr tablet Take 1  tablet (300 mg total) by mouth daily. (Take along with 150 mg tab for a total daily dose of 450mg ). 90 tablet 1   Cholecalciferol (VITAMIN D) 50 MCG (2000 UT) CAPS      Fluticasone -Umeclidin-Vilant (TRELEGY ELLIPTA ) 200-62.5-25 MCG/ACT AEPB Inhale 1 Inhalation into the lungs daily at 6 (six) AM. 60 each 3   latanoprost  (XALATAN ) 0.005 % ophthalmic solution Place 1 drop into both eyes daily every evening. 5 mL 12   prednisoLONE  acetate (PRED FORTE ) 1 % ophthalmic suspension *Starting after surgery*: Place 1 drop into the left eye 4 (four) times daily for 7 days, THEN 1 drop 3 (three) times daily for 7 days, THEN 1 drop 2 (two) times daily for 7 days, THEN 1 drop daily for 7 days. 7.5 mL 1   venlafaxine  XR (EFFEXOR -XR) 150 MG 24 hr capsule Take 1 capsule (150 mg total) by mouth daily. 90 capsule 3   venlafaxine  XR (EFFEXOR -XR) 75 MG 24 hr capsule Take 1 capsule (75 mg total) by mouth daily. 90 capsule 0   No current facility-administered medications for this visit.     Musculoskeletal: Strength & Muscle Tone: N/A Gait & Station: N/A Patient leans: N/A  Psychiatric Specialty Exam: Review of Systems  Psychiatric/Behavioral:  Positive for decreased concentration, dysphoric mood and sleep disturbance. Negative for agitation, behavioral problems, confusion, hallucinations, self-injury and suicidal ideas. The patient is not nervous/anxious and is not hyperactive.    All other systems reviewed and are negative.   There were no vitals taken for this visit.There is no height or weight on file to calculate BMI.  General Appearance: Well Groomed  Eye Contact:  Good  Speech:  Clear and Coherent  Volume:  Normal  Mood:  Depressed  Affect:  Appropriate, Congruent, and calm, slightly down  Thought Process:  Coherent  Orientation:  Full (Time, Place, and Person)  Thought Content: Logical   Suicidal Thoughts:  No  Homicidal Thoughts:  No  Memory:  Immediate;   Good  Judgement:  Good  Insight:  Good  Psychomotor Activity:  Normal  Concentration:  Concentration: Good and Attention Span: Good  Recall:  Good  Fund of Knowledge: Good  Language: Good  Akathisia:  No  Handed:  Right  AIMS (if indicated): not done  Assets:  Communication Skills Desire for Improvement  ADL's:  Intact  Cognition: WNL  Sleep:  Poor   Screenings: GAD-7    Flowsheet Row Office Visit from 07/07/2023 in [facility] at [facility] Counselor from 05/29/2023 in [facility] Office Visit from 09/29/2022 in [facility] at [facility] Office Visit from 01/28/2022 in [facility] Care & [facility] Medicine at [facility] Office Visit from 08/23/2021 in [facility] at [facility]  Total GAD-7 Score 2 0 0 0 0   PHQ2-9    Flowsheet Row Office Visit from 07/07/2023 in [facility] & [facility] Medicine at [facility] Clinical Support from 06/24/2023 in [facility] Primary Care & Sports Medicine at [facility] Counselor from 05/29/2023 in [facility] Office Visit from 09/29/2022 in [facility] Medicine at [facility] Clinical Support from 06/16/2022 in [facility] Primary Care & Sports Medicine at [facility]  PHQ-2 Total Score 1 0 0 0 0  PHQ-9 Total Score 2 0 1  0 --   Advertising copywriter from  05/29/2023 in [facility] UC from 02/05/2023 in [facility] RISK CATEGORY No Risk No Risk     Assessment and Plan:  Samantha Barton is a 73 y.o. year old female with a history of depression, arthritis, who presents for follow up appointment for below.   1. 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 Since relocating to [place]  several years ago, she has experienced a persistent sense of loneliness. While she acknowledges that her family loves her, she reports a lack of emotional nurturing during childhood, contributing to longstanding feelings of insecurity. She is grieving the loss of her father in 2023 and her mother in May 2024, and notes that her relationship with her sister has become distant since these losses.  There has been worsening in depressive symptoms, which has been triggered in the context of sense of related to loneliness in recent cataract surgery, where she had limited support.  While this could be situational, medication adjustment was offered to avoid relapse in her mood symptoms.  She agrees to consider pramipexole as adjunctive treatment for depression.  This medication is chosen given her preference to avoid medication which can potentially cause weight gain.  Will monitor for any drowsiness and compulsive behavior.  Will continue venlafaxine  to target depression, and anxiety, along with bupropion  as adjunctive treatment for depression.    Plan  Continue venlafaxine  225 mg daily   Continue bupropion  450 mg daily Consider starting pramipexole, 0.125 mg at night if interested Next appointment- 11/7 at 10 30, video [email or web address]     Past trials of medication: fluoxetine, citalopram, venlafaxine , bupropion , lithium, Abilify  (headache),    The patient demonstrates the  following risk factors for suicide: Chronic risk factors for suicide include: psychiatric disorder of depression. Acute risk factors for suicide include: unemployment and social withdrawal/isolation. Protective factors for this patient include: coping skills and hope for the future. Considering these factors, the overall suicide risk at this point appears to be low. Patient is appropriate for outpatient follow up.  Collaboration of Care: Collaboration of Care: Other reviewed notes in Epic  Patient/Guardian was advised Release of Information must be obtained prior to any record release in order to collaborate their care with an outside provider. Patient/Guardian was advised if they have not already done so to contact the registration department to sign all necessary forms in order for us  to release information regarding their care.   Consent: Patient/Guardian gives verbal consent for treatment and assignment of benefits for services provided during this visit. Patient/Guardian expressed understanding and agreed to proceed.    Katheren Sleet, MD 02/12/2024, 11:50 AM

## 2024-02-08 DIAGNOSIS — J45909 Unspecified asthma, uncomplicated: Secondary | ICD-10-CM | POA: Diagnosis not present

## 2024-02-08 DIAGNOSIS — H401122 Primary open-angle glaucoma, left eye, moderate stage: Secondary | ICD-10-CM | POA: Diagnosis not present

## 2024-02-08 DIAGNOSIS — F418 Other specified anxiety disorders: Secondary | ICD-10-CM | POA: Diagnosis not present

## 2024-02-08 DIAGNOSIS — H25812 Combined forms of age-related cataract, left eye: Secondary | ICD-10-CM | POA: Diagnosis not present

## 2024-02-12 ENCOUNTER — Encounter: Payer: Self-pay | Admitting: Psychiatry

## 2024-02-12 ENCOUNTER — Telehealth: Admitting: Psychiatry

## 2024-02-12 DIAGNOSIS — F330 Major depressive disorder, recurrent, mild: Secondary | ICD-10-CM

## 2024-02-12 NOTE — Patient Instructions (Signed)
 Continue venlafaxine  225 mg daily   Continue bupropion  450 mg daily Consider starting pramipexole, 0.125 mg at night if interested Next appointment- 11/7 at 10 30

## 2024-02-19 ENCOUNTER — Other Ambulatory Visit (HOSPITAL_COMMUNITY): Payer: Self-pay

## 2024-02-19 ENCOUNTER — Other Ambulatory Visit: Payer: Self-pay | Admitting: Psychiatry

## 2024-02-19 ENCOUNTER — Other Ambulatory Visit: Payer: Self-pay

## 2024-02-19 MED ORDER — BUPROPION HCL ER (XL) 300 MG PO TB24
300.0000 mg | ORAL_TABLET | Freq: Every day | ORAL | 1 refills | Status: AC
  Filled 2024-02-19: qty 90, 90d supply, fill #0
  Filled 2024-05-23: qty 90, 90d supply, fill #1

## 2024-02-22 ENCOUNTER — Other Ambulatory Visit: Payer: Self-pay

## 2024-02-22 ENCOUNTER — Other Ambulatory Visit (HOSPITAL_COMMUNITY): Payer: Self-pay

## 2024-02-22 MED ORDER — PREDNISOLONE ACETATE 1 % OP SUSP
OPHTHALMIC | 1 refills | Status: AC
  Filled 2024-02-22: qty 5, 28d supply, fill #0

## 2024-02-23 ENCOUNTER — Other Ambulatory Visit (HOSPITAL_BASED_OUTPATIENT_CLINIC_OR_DEPARTMENT_OTHER): Payer: Self-pay

## 2024-02-23 ENCOUNTER — Other Ambulatory Visit: Payer: Self-pay

## 2024-02-23 ENCOUNTER — Other Ambulatory Visit (HOSPITAL_COMMUNITY): Payer: Self-pay

## 2024-02-24 ENCOUNTER — Other Ambulatory Visit (HOSPITAL_COMMUNITY): Payer: Self-pay

## 2024-02-25 ENCOUNTER — Other Ambulatory Visit (HOSPITAL_COMMUNITY): Payer: Self-pay

## 2024-02-29 ENCOUNTER — Other Ambulatory Visit: Payer: Self-pay | Admitting: Internal Medicine

## 2024-02-29 DIAGNOSIS — H25811 Combined forms of age-related cataract, right eye: Secondary | ICD-10-CM | POA: Diagnosis not present

## 2024-02-29 DIAGNOSIS — H401112 Primary open-angle glaucoma, right eye, moderate stage: Secondary | ICD-10-CM | POA: Diagnosis not present

## 2024-02-29 DIAGNOSIS — F32A Depression, unspecified: Secondary | ICD-10-CM | POA: Diagnosis not present

## 2024-02-29 DIAGNOSIS — F3342 Major depressive disorder, recurrent, in full remission: Secondary | ICD-10-CM

## 2024-03-02 NOTE — Telephone Encounter (Signed)
 OFFICE VISIT NEEDED FOR ADDITIONAL REFILLS   Requested Prescriptions  Pending Prescriptions Disp Refills   venlafaxine  XR (EFFEXOR -XR) 150 MG 24 hr capsule 90 capsule 3    Sig: Take 1 capsule (150 mg total) by mouth daily.     Psychiatry: Antidepressants - SNRI - desvenlafaxine & venlafaxine  Failed - 03/02/2024 12:33 PM      Failed - Cr in normal range and within 360 days    Creatinine, Ser  Date Value Ref Range Status  07/07/2023 1.25 (H) 0.57 - 1.00 mg/dL Final         Failed - Valid encounter within last 6 months    Recent Outpatient Visits           7 months ago Annual physical exam   [facility], Leita DEL, MD              Failed - Lipid Panel in normal range within the last 12 months    Cholesterol, Total  Date Value Ref Range Status  07/07/2023 271 (H) 100 - 199 mg/dL Final   LDL Chol Calc (NIH)  Date Value Ref Range Status  07/07/2023 148 (H) 0 - 99 mg/dL Final   HDL  Date Value Ref Range Status  07/07/2023 94 >39 mg/dL Final   Triglycerides  Date Value Ref Range Status  07/07/2023 166 (H) 0 - 149 mg/dL Final         Passed - Completed PHQ-2 or PHQ-9 in the last 360 days      Passed - Last BP in normal range    BP Readings from Last 1 Encounters:  07/07/23 108/62

## 2024-03-03 ENCOUNTER — Other Ambulatory Visit: Payer: Self-pay | Admitting: Internal Medicine

## 2024-03-03 DIAGNOSIS — F3342 Major depressive disorder, recurrent, in full remission: Secondary | ICD-10-CM

## 2024-03-04 ENCOUNTER — Other Ambulatory Visit (HOSPITAL_COMMUNITY): Payer: Self-pay

## 2024-03-04 ENCOUNTER — Other Ambulatory Visit: Payer: Self-pay | Admitting: Psychiatry

## 2024-03-04 ENCOUNTER — Encounter (HOSPITAL_COMMUNITY): Payer: Self-pay

## 2024-03-04 DIAGNOSIS — F3342 Major depressive disorder, recurrent, in full remission: Secondary | ICD-10-CM

## 2024-03-07 ENCOUNTER — Telehealth: Payer: Self-pay

## 2024-03-07 ENCOUNTER — Other Ambulatory Visit: Payer: Self-pay | Admitting: Psychiatry

## 2024-03-07 ENCOUNTER — Encounter (HOSPITAL_COMMUNITY): Payer: Self-pay

## 2024-03-07 ENCOUNTER — Other Ambulatory Visit (HOSPITAL_COMMUNITY): Payer: Self-pay

## 2024-03-07 DIAGNOSIS — F3342 Major depressive disorder, recurrent, in full remission: Secondary | ICD-10-CM

## 2024-03-07 MED ORDER — VENLAFAXINE HCL ER 150 MG PO CP24
150.0000 mg | ORAL_CAPSULE | Freq: Every day | ORAL | 1 refills | Status: AC
  Filled 2024-03-07: qty 90, 90d supply, fill #0
  Filled 2024-06-03: qty 90, 90d supply, fill #1

## 2024-03-07 NOTE — Telephone Encounter (Signed)
 Ordered

## 2024-03-07 NOTE — Telephone Encounter (Signed)
 Medication management - Left patient a message, after she left one to make sure no current Venlafaxine  XR 150 mg on order at pt's [facility]. Pharmacist reported last ordered 02/10/2023 and last filled July 2025 so pt is in need of a new order.

## 2024-03-07 NOTE — Telephone Encounter (Signed)
 Medication refill - Message left for patient that Dr. Vickey had sent in a new Venlafaxine  XR 150 mg order to her [facility] as she had requested. and requested pt call our office back if anything else needed.

## 2024-03-08 ENCOUNTER — Other Ambulatory Visit (HOSPITAL_COMMUNITY): Payer: Self-pay
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
## 2024-04-01 ENCOUNTER — Telehealth: Admitting: Psychiatry

## 2024-04-01 ENCOUNTER — Other Ambulatory Visit (HOSPITAL_COMMUNITY): Payer: Self-pay

## 2024-04-01 ENCOUNTER — Other Ambulatory Visit: Payer: Self-pay

## 2024-04-01 ENCOUNTER — Encounter: Payer: Self-pay | Admitting: Psychiatry

## 2024-04-01 DIAGNOSIS — F331 Major depressive disorder, recurrent, moderate: Secondary | ICD-10-CM | POA: Diagnosis not present

## 2024-04-01 MED ORDER — CARIPRAZINE HCL 1.5 MG PO CAPS
1.5000 mg | ORAL_CAPSULE | Freq: Every day | ORAL | 0 refills | Status: DC
  Filled 2024-04-01: qty 30, 30d supply, fill #0

## 2024-04-01 MED ORDER — BREXPIPRAZOLE 0.25 MG PO TABS
0.2500 mg | ORAL_TABLET | Freq: Every day | ORAL | 0 refills | Status: DC
  Filled 2024-04-01: qty 30, 30d supply, fill #0

## 2024-04-01 MED ORDER — L-METHYLFOLATE 15 MG PO TABS
ORAL_TABLET | ORAL | 2 refills | Status: AC
  Filled 2024-04-01: qty 30, 28d supply, fill #0

## 2024-04-01 NOTE — Patient Instructions (Addendum)
 Continue venlafaxine  225 mg daily   Continue bupropion  450 mg daily Consider TMS Start L-methylfolate 7.5 mg daily for one week, then 15 mg daily Next appointment- 12/23 at 4 30

## 2024-04-06 ENCOUNTER — Encounter (HOSPITAL_COMMUNITY): Payer: Self-pay

## 2024-04-06 ENCOUNTER — Other Ambulatory Visit (HOSPITAL_COMMUNITY): Payer: Self-pay

## 2024-04-08 DIAGNOSIS — H25811 Combined forms of age-related cataract, right eye: Secondary | ICD-10-CM | POA: Diagnosis not present

## 2024-04-26 ENCOUNTER — Other Ambulatory Visit: Payer: Self-pay | Admitting: Internal Medicine

## 2024-04-26 DIAGNOSIS — J4540 Moderate persistent asthma, uncomplicated: Secondary | ICD-10-CM

## 2024-04-28 ENCOUNTER — Telehealth: Payer: Self-pay

## 2024-04-28 ENCOUNTER — Other Ambulatory Visit (HOSPITAL_COMMUNITY): Payer: Self-pay

## 2024-04-28 MED ORDER — ALBUTEROL SULFATE HFA 108 (90 BASE) MCG/ACT IN AERS
2.0000 | INHALATION_SPRAY | Freq: Four times a day (QID) | RESPIRATORY_TRACT | 2 refills | Status: AC | PRN
  Filled 2024-04-28: qty 6.7, 25d supply, fill #0

## 2024-04-28 NOTE — Telephone Encounter (Signed)
 Requested Prescriptions  Pending Prescriptions Disp Refills   albuterol  (VENTOLIN  HFA) 108 (90 Base) MCG/ACT inhaler 6.7 g 2    Sig: Inhale 2 puffs into the lungs every 6 (six) hours as needed for wheezing or shortness of breath.     Pulmonology:  Beta Agonists 2 Passed - 04/28/2024  5:24 PM      Passed - Last BP in normal range    BP Readings from Last 1 Encounters:  07/07/23 108/62         Passed - Last Heart Rate in normal range    Pulse Readings from Last 1 Encounters:  07/07/23 (!) 103         Passed - Valid encounter within last 12 months    Recent Outpatient Visits           9 months ago Annual physical exam   [facility] Primary Care & Sports Medicine at [facility], Leita DEL, MD

## 2024-04-28 NOTE — Telephone Encounter (Signed)
 A referral has been sent to the [facility] team. Please inform her to expect a call from them.

## 2024-05-02 ENCOUNTER — Telehealth: Payer: Self-pay | Admitting: Psychiatry

## 2024-05-02 NOTE — Telephone Encounter (Signed)
 We sent a referral to [facility] in [place]. While they are able to see her, they reported that her insurance would likely not cover the treatment. Please ask her to contact her insurance company to verify her benefits. If coverage is not available, we will need to explore another route.

## 2024-05-02 NOTE — Telephone Encounter (Signed)
 Unable to lvm to let pt know message below.

## 2024-05-02 NOTE — Telephone Encounter (Signed)
 Patient was notified confirmed and verbalized understanding.

## 2024-05-04 NOTE — Telephone Encounter (Signed)
 Patient stated that she would call her insurance and call the office and give an update

## 2024-05-09 ENCOUNTER — Other Ambulatory Visit: Payer: Self-pay | Admitting: Internal Medicine

## 2024-05-09 ENCOUNTER — Other Ambulatory Visit: Payer: Self-pay

## 2024-05-09 ENCOUNTER — Telehealth (HOSPITAL_COMMUNITY): Payer: Self-pay

## 2024-05-09 DIAGNOSIS — F3342 Major depressive disorder, recurrent, in full remission: Secondary | ICD-10-CM

## 2024-05-09 NOTE — Telephone Encounter (Signed)
 [employer] Coordinator sent outgoing email to pt. Email contained PHQ-9 and BDI assessments, along with CPT information for patient to verify coverage with insurance carrier. Will await returned assessments and verification before scheduling consultation with program attending.

## 2024-05-09 NOTE — Telephone Encounter (Signed)
 [employer] Coordinator spoke with pt via outgoing call. Coordinator relayed that they still had to conduct initial screening assessments; provided pt options for how to complete. Pt elected for assessments to be sent via email, along with CPT information for pt to verify coverage (must be under behavioral not medical). Pt was agreeable to plan and hopeful to get consultation soon.

## 2024-05-09 NOTE — Telephone Encounter (Signed)
 [employer] Coordinator received call from pt. Pt left vm acknowledging OP provider made them aware of coverage issue after discussing referral. Pt stated they were covered and just want to schedule a consult before they go through tx. Coordinator will call back informing pt of screening process, advising pt to check if covered under medical or behavioral, and see if able to conduct screenings while on call before scheduling consult.

## 2024-05-10 ENCOUNTER — Telehealth (HOSPITAL_COMMUNITY): Payer: Self-pay

## 2024-05-10 ENCOUNTER — Other Ambulatory Visit: Payer: Self-pay

## 2024-05-10 NOTE — Telephone Encounter (Signed)
 Pt sent [facility] Coordinator incoming email with returned/completed screening assessments (PHQ & BDI). Pt scored a 14 on PHQ-9 and scored a 30 on BDI. Scores indicate moderate-severe depression severity, making pt eligible for tx. Coordinator will await pt's response on insurance verification before scheduling consult.

## 2024-05-12 ENCOUNTER — Other Ambulatory Visit (HOSPITAL_COMMUNITY): Payer: Self-pay

## 2024-05-12 MED ORDER — VENLAFAXINE HCL ER 75 MG PO CP24
75.0000 mg | ORAL_CAPSULE | Freq: Every day | ORAL | 0 refills | Status: AC
  Filled 2024-05-12: qty 90, 90d supply, fill #0

## 2024-05-12 NOTE — Telephone Encounter (Signed)
 Requested Prescriptions  Pending Prescriptions Disp Refills   venlafaxine  XR (EFFEXOR -XR) 75 MG 24 hr capsule 90 capsule 0    Sig: Take 1 capsule (75 mg total) by mouth daily.     Psychiatry: Antidepressants - SNRI - desvenlafaxine & venlafaxine  Failed - 05/12/2024 11:16 AM      Failed - Cr in normal range and within 360 days    Creatinine, Ser  Date Value Ref Range Status  07/07/2023 1.25 (H) 0.57 - 1.00 mg/dL Final         Failed - Valid encounter within last 6 months    Recent Outpatient Visits           10 months ago Annual physical exam   [place] [facility] at [facility], Leita DEL, MD              Failed - Lipid Panel in normal range within the last 12 months    Cholesterol, Total  Date Value Ref Range Status  07/07/2023 271 (H) 100 - 199 mg/dL Final   LDL Chol Calc (NIH)  Date Value Ref Range Status  07/07/2023 148 (H) 0 - 99 mg/dL Final   HDL  Date Value Ref Range Status  07/07/2023 94 >39 mg/dL Final   Triglycerides  Date Value Ref Range Status  07/07/2023 166 (H) 0 - 149 mg/dL Final         Passed - Completed PHQ-2 or PHQ-9 in the last 360 days      Passed - Last BP in normal range    BP Readings from Last 1 Encounters:  07/07/23 108/62

## 2024-05-13 ENCOUNTER — Other Ambulatory Visit (HOSPITAL_COMMUNITY): Payer: Self-pay

## 2024-05-17 ENCOUNTER — Telehealth: Admitting: Psychiatry

## 2024-05-18 ENCOUNTER — Telehealth (HOSPITAL_COMMUNITY): Payer: Self-pay

## 2024-05-18 NOTE — Telephone Encounter (Signed)
 Pt responded to Coordinator's las email. Pt stated I think the copay is for the entire treatment. Or use of the facility with a separate fee for the provider? Currently I have sunk into a deeper depression from my initial contact with you. Making phone calls for information has become challenging for me. I would like to schedule an appointment with a consultant as soon as possible.  My psychiatrist is away for the holidays so it's a bummer. I need my medications to be adjusted.  Coordinator responded to pt and provided sympathy for worsening symptoms, along with additional resources pt could utilize if symptoms become severe, or until they're able to see their OP provider (mentally reframing calls as motivation, visiting closest ED or [facility]). Coordinator provided billing dept's number if pt needs claims information. Pt made aware of facility fee + provider fee. Coordinator concluded that they would work on getting pt scheduled for consult as soon as office re-opens after the holidays.

## 2024-05-18 NOTE — Telephone Encounter (Signed)
 Pt sent [facility] Coordinator incoming email on 05/17/24 at 6:02pm, regarding insurance benefit verification. Pt stated they would have a $325 co-pay for services. Pre-authorization is required.   Coordinator responded on 05/18/24 and asked pt if they felt comfortable to move forward with intake process. Coordinator also sent reminder of office holiday hours/closures. Coordinator concluded by assuring pt to not hesitate to reach out with any urgent needs.
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
## 2024-05-25 ENCOUNTER — Telehealth: Admitting: Psychiatry

## 2024-05-25 ENCOUNTER — Encounter: Payer: Self-pay | Admitting: Psychiatry

## 2024-05-25 ENCOUNTER — Other Ambulatory Visit (HOSPITAL_COMMUNITY): Payer: Self-pay

## 2024-05-25 DIAGNOSIS — F332 Major depressive disorder, recurrent severe without psychotic features: Secondary | ICD-10-CM

## 2024-05-25 MED ORDER — BUPROPION HCL ER (XL) 150 MG PO TB24
150.0000 mg | ORAL_TABLET | Freq: Every day | ORAL | 1 refills | Status: AC
  Filled 2024-06-28: qty 90, 90d supply, fill #0

## 2024-05-25 MED ORDER — MIRTAZAPINE 7.5 MG PO TABS
7.5000 mg | ORAL_TABLET | Freq: Every day | ORAL | 1 refills | Status: AC
  Filled 2024-05-25: qty 30, 30d supply, fill #0

## 2024-05-25 NOTE — Patient Instructions (Signed)
 Continue venlafaxine  225 mg daily   Continue bupropion  450 mg daily Start mirtazapine 7.5 mg at night  Consider TMS Continue L-methylfolate 15 mg daily Next appointment- 2/11 at 11 am

## 2024-05-30 ENCOUNTER — Telehealth (HOSPITAL_COMMUNITY): Payer: Self-pay

## 2024-05-30 NOTE — Telephone Encounter (Signed)
 TMS Coordinator placed call to pt to check-in and see if they would like to move forward with consultation. Pt agreed to virtual format of consult, and will await days/times available. Coordinator asked clarifying questions pertaining to orbital fracture; pt disclosed metalwork on right eye, but stated they're titanium with some nickeline aspects, but I don't really know. Coordinator asked if pt would be willing to fill out paperwork giving permission for TMS team to contact surgical team to see if they could clear them for treatment, or give info on device/stent manufacture. Pt agreed and will fill out ppw. Coordinator contacted [employer] attending after phone call, and is available Thursday after 2pm or Friday after 12pm. Will alert pt and schedule.

## 2024-05-30 NOTE — Telephone Encounter (Signed)
 TMS Coordinator placed call to pt after confirming availability with attending provider for TMS consult. Pt informed that provider is available on Thursday at 2pm or Friday, anytime after 12pm. Pt elected for Thursday at 2pm. Coordinator confirmed that pt received email with recent consent form - pt said they haven't looked yet but wanted to ensure 'secure' responses were still being received. Coordinator confirmed that yes, due to new security update it will redirect emails, but they are still being received successfully. Pt thanked Coordinator and will await link for virtual consult.

## 2024-05-31 ENCOUNTER — Telehealth (HOSPITAL_COMMUNITY): Payer: Self-pay

## 2024-05-31 NOTE — Telephone Encounter (Signed)
 Pt responded to previous email; pt alerted TMS Coordinator that they had stents placed into both eyes and stents were labeled as 'MRI conditional.' Coordinator responded to pt and alerted program attending to new information. Coordinator offered to still keep consult scheduled, while informing them that stents may disqualify them from treatment. Attending alerted Coordinator to verify metalwork with TMS manufacturer and pt's surgical team BEFORE consult to ensure appropriate care. Coordinator will call patient to re-explain options.

## 2024-05-31 NOTE — Telephone Encounter (Signed)
 Pt responded to previous email and attempted to provide information regarding metalwork from previous orbital fracture and recent bilateral stents. Pt stated that fracture repair was done 10 years ago through [facility] with a titanium mesh screen, stents were done by Dr. Corbin at [facility]. Pt stated stents were the [employer] Microstents with nickel and titanium alloy (nitinol) and can be safely MR scanned with the following conditions... Email was forwarded to attending. Upon closer examination of surgical notes, fracture was repaired with a plate, not mesh screen. Pt sent ROI form back to Coordinator, only half completed.   Coordinator attempted to call pt to explain correction to previous email (will cancel consult until cleared of safety risk), pt did not answer. Coordinator will send email explaining to pt reasoning for cancellation and proper form completion.

## 2024-05-31 NOTE — Telephone Encounter (Signed)
 Email sent to pt from Coordinator; explained how metalwork (orbital plate and stents) disqualifies them from treatment as deemed too high of a safety risk. Metalwork is directly contraindicated, which would result in denial from insurance payor as well. Coordinator included a screenshot from [employer] within email for pt's visual reference of high-risk implants. Coordinator concluded with advice to discuss other treatments for depressive symptoms (spravato) with outpt provider. Coordinator BCC'd [employer] attending for awareness. Consult appt canceled.

## 2024-06-02 ENCOUNTER — Other Ambulatory Visit (HOSPITAL_COMMUNITY): Admitting: Student in an Organized Health Care Education/Training Program

## 2024-06-06 ENCOUNTER — Other Ambulatory Visit (HOSPITAL_COMMUNITY): Payer: Self-pay

## 2024-06-28 ENCOUNTER — Other Ambulatory Visit (HOSPITAL_COMMUNITY): Payer: Self-pay
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
## 2024-07-06 ENCOUNTER — Telehealth: Admitting: Psychiatry

## 2024-07-29 ENCOUNTER — Encounter: Admitting: Nurse Practitioner
